# Patient Record
Sex: Male | Born: 1960 | Hispanic: No | Marital: Married | Smoking: Never smoker
Health system: Southern US, Community
[De-identification: ages and names within clinical notes are randomized; demographics above are authoritative.]

## PROBLEM LIST (undated history)

## (undated) DIAGNOSIS — K219 Gastro-esophageal reflux disease without esophagitis: Secondary | ICD-10-CM

## (undated) DIAGNOSIS — E785 Hyperlipidemia, unspecified: Secondary | ICD-10-CM

## (undated) DIAGNOSIS — E119 Type 2 diabetes mellitus without complications: Secondary | ICD-10-CM

## (undated) DIAGNOSIS — I10 Essential (primary) hypertension: Secondary | ICD-10-CM

## (undated) HISTORY — PX: COLONOSCOPY: SHX174

## (undated) HISTORY — DX: Type 2 diabetes mellitus without complications: E11.9

## (undated) HISTORY — DX: Essential (primary) hypertension: I10

## (undated) HISTORY — DX: Hyperlipidemia, unspecified: E78.5

## (undated) HISTORY — DX: Gastro-esophageal reflux disease without esophagitis: K21.9

---

## 2010-11-04 ENCOUNTER — Emergency Department (HOSPITAL_COMMUNITY)
Admission: EM | Admit: 2010-11-04 | Discharge: 2010-11-04 | Disposition: A | Discharge status: Home / Self Care | Payer: BC Managed Care – PPO | Attending: Emergency Medicine | Admitting: Emergency Medicine

## 2010-11-04 DIAGNOSIS — Z79899 Other long term (current) drug therapy: Secondary | ICD-10-CM | POA: Insufficient documentation

## 2010-11-04 DIAGNOSIS — I10 Essential (primary) hypertension: Secondary | ICD-10-CM | POA: Insufficient documentation

## 2010-11-04 DIAGNOSIS — H9190 Unspecified hearing loss, unspecified ear: Secondary | ICD-10-CM | POA: Insufficient documentation

## 2010-11-04 DIAGNOSIS — H9209 Otalgia, unspecified ear: Secondary | ICD-10-CM | POA: Insufficient documentation

## 2010-11-04 DIAGNOSIS — H7290 Unspecified perforation of tympanic membrane, unspecified ear: Secondary | ICD-10-CM | POA: Insufficient documentation

## 2010-11-04 DIAGNOSIS — E119 Type 2 diabetes mellitus without complications: Secondary | ICD-10-CM | POA: Insufficient documentation

## 2010-11-04 DIAGNOSIS — E7800 Pure hypercholesterolemia, unspecified: Secondary | ICD-10-CM | POA: Insufficient documentation

## 2011-09-13 ENCOUNTER — Encounter: Payer: Self-pay | Admitting: Internal Medicine

## 2011-09-15 ENCOUNTER — Ambulatory Visit (INDEPENDENT_AMBULATORY_CARE_PROVIDER_SITE_OTHER): Payer: BC Managed Care – PPO

## 2011-09-15 DIAGNOSIS — Z0000 Encounter for general adult medical examination without abnormal findings: Secondary | ICD-10-CM

## 2011-09-15 DIAGNOSIS — E119 Type 2 diabetes mellitus without complications: Secondary | ICD-10-CM

## 2011-09-15 DIAGNOSIS — E789 Disorder of lipoprotein metabolism, unspecified: Secondary | ICD-10-CM

## 2011-10-16 ENCOUNTER — Other Ambulatory Visit: Payer: Self-pay | Admitting: Physician Assistant

## 2012-03-28 ENCOUNTER — Ambulatory Visit: Payer: BC Managed Care – PPO | Admitting: Internal Medicine

## 2012-03-28 VITALS — BP 112/70 | HR 88 | Temp 98.2°F | Resp 16 | Ht 69.5 in | Wt 182.0 lb

## 2012-03-28 DIAGNOSIS — S335XXA Sprain of ligaments of lumbar spine, initial encounter: Secondary | ICD-10-CM

## 2012-03-28 DIAGNOSIS — E139 Other specified diabetes mellitus without complications: Secondary | ICD-10-CM

## 2012-03-28 DIAGNOSIS — E119 Type 2 diabetes mellitus without complications: Secondary | ICD-10-CM

## 2012-03-28 DIAGNOSIS — S39012A Strain of muscle, fascia and tendon of lower back, initial encounter: Secondary | ICD-10-CM

## 2012-03-28 LAB — GLUCOSE, POCT (MANUAL RESULT ENTRY): POC Glucose: 81 mg/dl (ref 70–99)

## 2012-03-28 LAB — POCT GLYCOSYLATED HEMOGLOBIN (HGB A1C): Hemoglobin A1C: 6.3

## 2012-03-28 MED ORDER — SIMVASTATIN 20 MG PO TABS: 20.0000 mg | ORAL_TABLET | Freq: Every evening | ORAL | Status: DC

## 2012-03-28 MED ORDER — METFORMIN HCL 1000 MG PO TABS: 1000.0000 mg | ORAL_TABLET | Freq: Two times a day (BID) | ORAL | Status: DC

## 2012-03-28 MED ORDER — LOSARTAN POTASSIUM 50 MG PO TABS: 50.0000 mg | ORAL_TABLET | Freq: Every day | ORAL | Status: DC

## 2012-03-28 MED ORDER — GLIMEPIRIDE 2 MG PO TABS: 2.0000 mg | ORAL_TABLET | Freq: Every day | ORAL | Status: DC

## 2012-03-28 NOTE — Progress Notes (Signed)
  Subjective:    Patient ID: Wayne Perez, male    DOB: June 15, 1961, 51 y.o.   MRN: [id number]  HPI Has niddm, has been controlled. Last a1c was 6.1 Strained back at work, has mild left ls pain with use only.   Review of Systems     Objective:   Physical Exam Normal, sensation intact  Cmet,a1c,gl Results for orders placed in visit on 03/28/12  GLUCOSE, POCT (MANUAL RESULT ENTRY)      Component Value Range   POC Glucose 81  70 - 99 mg/dl  POCT GLYCOSYLATED HEMOGLOBIN (HGB A1C)      Component Value Range   Hemoglobin A1C 6.3           Assessment & Plan:  Back manual RF dm meds

## 2012-03-29 LAB — COMPREHENSIVE METABOLIC PANEL
ALT: 27 U/L (ref 0–53)
AST: 24 U/L (ref 0–37)
Alkaline Phosphatase: 49 U/L (ref 39–117)
CO2: 25 mEq/L (ref 19–32)
Creat: 0.86 mg/dL (ref 0.50–1.35)
Sodium: 134 mEq/L — ABNORMAL LOW (ref 135–145)
Total Bilirubin: 0.8 mg/dL (ref 0.3–1.2)
Total Protein: 7.3 g/dL (ref 6.0–8.3)

## 2012-07-31 ENCOUNTER — Ambulatory Visit (INDEPENDENT_AMBULATORY_CARE_PROVIDER_SITE_OTHER): Payer: BC Managed Care – PPO | Admitting: Internal Medicine

## 2012-07-31 ENCOUNTER — Encounter: Payer: Self-pay | Admitting: Internal Medicine

## 2012-07-31 VITALS — BP 118/84 | HR 60 | Temp 97.9°F | Resp 16 | Ht 69.0 in | Wt 188.9 lb

## 2012-07-31 DIAGNOSIS — I10 Essential (primary) hypertension: Secondary | ICD-10-CM | POA: Insufficient documentation

## 2012-07-31 DIAGNOSIS — M545 Low back pain: Secondary | ICD-10-CM

## 2012-07-31 DIAGNOSIS — Z23 Encounter for immunization: Secondary | ICD-10-CM

## 2012-07-31 DIAGNOSIS — E119 Type 2 diabetes mellitus without complications: Secondary | ICD-10-CM

## 2012-07-31 DIAGNOSIS — C91Z0 Other lymphoid leukemia not having achieved remission: Secondary | ICD-10-CM

## 2012-07-31 NOTE — Patient Instructions (Signed)

## 2012-07-31 NOTE — Progress Notes (Signed)
  Subjective:    Patient ID: Wayne Perez, male    DOB: Dec 21, 1960, 51 y.o.   MRN: [id number]  HPI DM controlled  Review of Systems     Objective:   Physical Exam Normal  Results for orders placed in visit on 07/31/12  GLUCOSE, POCT (MANUAL RESULT ENTRY)      Component Value Range   POC Glucose 114 (*) 70 - 99 mg/dl  POCT GLYCOSYLATED HEMOGLOBIN (HGB A1C)      Component Value Range   Hemoglobin A1C 6.8          Assessment & Plan:  RF meds 35yr

## 2013-01-01 ENCOUNTER — Ambulatory Visit: Payer: BC Managed Care – PPO | Admitting: Family Medicine

## 2013-01-01 VITALS — BP 124/82 | HR 74 | Temp 98.2°F | Resp 17 | Ht 69.5 in | Wt 190.0 lb

## 2013-01-01 DIAGNOSIS — K219 Gastro-esophageal reflux disease without esophagitis: Secondary | ICD-10-CM

## 2013-01-01 MED ORDER — ESOMEPRAZOLE MAGNESIUM 40 MG PO CPDR: 40.0000 mg | DELAYED_RELEASE_CAPSULE | Freq: Every day | ORAL | Status: DC

## 2013-01-01 MED ORDER — RANITIDINE HCL 150 MG PO TABS: 150.0000 mg | ORAL_TABLET | Freq: Two times a day (BID) | ORAL | Status: DC | PRN

## 2013-01-01 NOTE — Patient Instructions (Addendum)
Diet for Gastroesophageal Reflux Disease, Adult  Reflux (acid reflux) is when acid from your stomach flows up into the esophagus. When acid comes in contact with the esophagus, the acid causes irritation and soreness (inflammation) in the esophagus. When reflux happens often or so severely that it causes damage to the esophagus, it is called gastroesophageal reflux disease (GERD). Nutrition therapy can help ease the discomfort of GERD.  FOODS OR DRINKS TO AVOID OR LIMIT  · Smoking or chewing tobacco. Nicotine is one of the most potent stimulants to acid production in the gastrointestinal tract.  · Caffeinated and decaffeinated coffee and black tea.  · Regular or low-calorie carbonated beverages or energy drinks (caffeine-free carbonated beverages are allowed).    · Strong spices, such as black pepper, white pepper, red pepper, cayenne, curry powder, and chili powder.  · Peppermint or spearmint.  · Chocolate.  · High-fat foods, including meats and fried foods. Extra added fats including oils, butter, salad dressings, and nuts. Limit these to less than 8 tsp per day.  · Fruits and vegetables if they are not tolerated, such as citrus fruits or tomatoes.  · Alcohol.  · Any food that seems to aggravate your condition.  If you have questions regarding your diet, call your caregiver or a registered dietitian.  OTHER THINGS THAT MAY HELP GERD INCLUDE:   · Eating your meals slowly, in a relaxed setting.  · Eating 5 to 6 small meals per day instead of 3 large meals.  · Eliminating food for a period of time if it causes distress.  · Not lying down until 3 hours after eating a meal.  · Keeping the head of your bed raised 6 to 9 inches (15 to 23 cm) by using a foam wedge or blocks under the legs of the bed. Lying flat may make symptoms worse.  · Being physically active. Weight loss may be helpful in reducing reflux in overweight or obese adults.  · Wear loose fitting clothing  EXAMPLE MEAL PLAN  This meal plan is approximately  2,000 calories based on ChooseMyPlate.gov meal planning guidelines.  Breakfast  · ½ cup cooked oatmeal.  · 1 cup strawberries.  · 1 cup low-fat milk.  · 1 oz almonds.  Snack  · 1 cup cucumber slices.  · 6 oz yogurt (made from low-fat or fat-free milk).  Lunch  · 2 slice whole-wheat bread.  · 2½ oz sliced turkey.  · 2 tsp mayonnaise.  · 1 cup blueberries.  · 1 cup snap peas.  Snack  · 6 whole-wheat crackers.  · 1 oz string cheese.  Dinner  · ½ cup brown rice.  · 1 cup mixed veggies.  · 1 tsp olive oil.  · 3 oz grilled fish.  Document Released: 08/23/2005 Document Revised: 11/15/2011 Document Reviewed: 07/09/2011  ExitCare® Patient Information ©2013 [employer].  Gastroesophageal Reflux Disease, Adult  Gastroesophageal reflux disease (GERD) happens when acid from your stomach flows up into the esophagus. When acid comes in contact with the esophagus, the acid causes soreness (inflammation) in the esophagus. Over time, GERD may create small holes (ulcers) in the lining of the esophagus.  CAUSES   · Increased body weight. This puts pressure on the stomach, making acid rise from the stomach into the esophagus.  · Smoking. This increases acid production in the stomach.  · Drinking alcohol. This causes decreased pressure in the lower esophageal sphincter (valve or ring of muscle between the esophagus and stomach), allowing acid from the stomach   into the esophagus.  · Late evening meals and a full stomach. This increases pressure and acid production in the stomach.  · A malformed lower esophageal sphincter.  Sometimes, no cause is found.  SYMPTOMS   · Burning pain in the lower part of the mid-chest behind the breastbone and in the mid-stomach area. This may occur twice a week or more often.  · Trouble swallowing.  · Sore throat.  · Dry cough.  · Asthma-like symptoms including chest tightness, shortness of breath, or wheezing.  DIAGNOSIS   Your caregiver may be able to diagnose GERD based on your symptoms. In some cases,  X-rays and other tests may be done to check for complications or to check the condition of your stomach and esophagus.  TREATMENT   Your caregiver may recommend over-the-counter or prescription medicines to help decrease acid production. Ask your caregiver before starting or adding any new medicines.   HOME CARE INSTRUCTIONS   · Change the factors that you can control. Ask your caregiver for guidance concerning weight loss, quitting smoking, and alcohol consumption.  · Avoid foods and drinks that make your symptoms worse, such as:  · Caffeine or alcoholic drinks.  · Chocolate.  · Peppermint or mint flavorings.  · Garlic and onions.  · Spicy foods.  · Citrus fruits, such as oranges, lemons, or limes.  · Tomato-based foods such as sauce, chili, salsa, and pizza.  · Fried and fatty foods.  · Avoid lying down for the 3 hours prior to your bedtime or prior to taking a nap.  · Eat small, frequent meals instead of large meals.  · Wear loose-fitting clothing. Do not wear anything tight around your waist that causes pressure on your stomach.  · Raise the head of your bed 6 to 8 inches with wood blocks to help you sleep. Extra pillows will not help.  · Only take over-the-counter or prescription medicines for pain, discomfort, or fever as directed by your caregiver.  · Do not take aspirin, ibuprofen, or other nonsteroidal anti-inflammatory drugs (NSAIDs).  SEEK IMMEDIATE MEDICAL CARE IF:   · You have pain in your arms, neck, jaw, teeth, or back.  · Your pain increases or changes in intensity or duration.  · You develop nausea, vomiting, or sweating (diaphoresis).  · You develop shortness of breath, or you faint.  · Your vomit is green, yellow, black, or looks like coffee grounds or blood.  · Your stool is red, bloody, or black.  These symptoms could be signs of other problems, such as heart disease, gastric bleeding, or esophageal bleeding.  MAKE SURE YOU:   · Understand these instructions.  · Will watch your  condition.  · Will get help right away if you are not doing well or get worse.  Document Released: 06/02/2005 Document Revised: 11/15/2011 Document Reviewed: 03/12/2011  ExitCare® Patient Information ©2013 [employer].

## 2013-01-01 NOTE — Progress Notes (Signed)
Subjective:    Patient ID: Wayne Perez, male    DOB: Apr 20, 1961, 52 y.o.   MRN: [id number] Chief Complaint  Patient presents with  . Heartburn    X 2 months. Pt states that for the last 2 months everything he eats gives him heartburn. He has tried OTC meds but the symptoms return.    HPI  Mr. Schipani is having constant heartburn - c/o 2 mos of epigastric pain burning, usually at night - works at night.  No water brash, no melena, no hematochezia.  Normal bowels and urination, no n/v.   Has been using otc medicine w/o relief - mainly otc prilosec once a day.  Used to be on rx ppi which worked but he doesn't want to have to stay on a med.  Would like a more permanent fix.  Past Medical History  Diagnosis Date  . Diabetes mellitus without complication   . Hypertension   . Hyperlipidemia    Current Outpatient Prescriptions on File Prior to Visit  Medication Sig Dispense Refill  . aspirin 81 MG tablet Take 81 mg by mouth daily.      Marland Kitchen glimepiride (AMARYL) 2 MG tablet Take 1 tablet (2 mg total) by mouth daily before breakfast.  90 tablet  3  . losartan (COZAAR) 50 MG tablet Take 1 tablet (50 mg total) by mouth daily.  90 tablet  3  . metFORMIN (GLUCOPHAGE) 1000 MG tablet Take 1 tablet (1,000 mg total) by mouth 2 (two) times daily with a meal.  180 tablet  3  . simvastatin (ZOCOR) 20 MG tablet Take 1 tablet (20 mg total) by mouth every evening.  90 tablet  3  . sitaGLIPtin (JANUVIA) 100 MG tablet Take 100 mg by mouth daily.       No current facility-administered medications on file prior to visit.   Allergies  Allergen Reactions  . Ace Inhibitors Itching     Review of Systems  Constitutional: Positive for appetite change. Negative for fever, chills, diaphoresis, activity change and unexpected weight change.  Respiratory: Negative for shortness of breath.   Cardiovascular: Negative for chest pain.  Gastrointestinal: Positive for abdominal pain. Negative for nausea, vomiting, diarrhea,  constipation, blood in stool, abdominal distention, anal bleeding and rectal pain.  Genitourinary: Negative for dysuria, frequency and decreased urine volume.  Skin: Negative for pallor.  Hematological: Negative for adenopathy. Does not bruise/bleed easily.      BP 124/82  Pulse 74  Temp(Src) 98.2 F (36.8 C) (Oral)  Resp 17  Ht 5' 9.5" (1.765 m)  Wt 190 lb (86.183 kg)  BMI 27.67 kg/m2  SpO2 100% Objective:   Physical Exam  Constitutional: He appears well-developed and well-nourished. No distress.  HENT:  Head: Normocephalic and atraumatic.  Neck: Normal range of motion. Neck supple. No thyromegaly present.  Cardiovascular: Normal rate, regular rhythm and normal heart sounds.   Pulmonary/Chest: Effort normal and breath sounds normal.  Abdominal: Soft. Normal appearance and bowel sounds are normal. He exhibits no distension and no mass. There is no tenderness. There is no rebound, no guarding and no CVA tenderness. No hernia.  Genitourinary: Rectum normal and prostate normal. Rectal exam shows no tenderness and anal tone normal. Guaiac negative stool.  Lymphadenopathy:    He has no cervical adenopathy.  Skin: He is not diaphoretic.      Assessment & Plan:  GERD (gastroesophageal reflux disease) - Plan: H. pylori antibody, IgG, HELICOBACTER PYLORI  ANTIBODY, IGM, - try ppi daily x 2-3  mos w/ prn h2 blocker if needed for break through. If pt has persistent sxs, consider GI referreral for upper endoscopy.   Meds ordered this encounter  Medications  . esomeprazole (NEXIUM) 40 MG capsule    Sig: Take 1 capsule (40 mg total) by mouth daily.    Dispense:  90 capsule    Refill:  3  . ranitidine (ZANTAC) 150 MG tablet    Sig: Take 1 tablet (150 mg total) by mouth 2 (two) times daily as needed for heartburn.    Dispense:  60 tablet    Refill:  5

## 2013-02-26 ENCOUNTER — Ambulatory Visit (INDEPENDENT_AMBULATORY_CARE_PROVIDER_SITE_OTHER): Payer: BC Managed Care – PPO | Admitting: Internal Medicine

## 2013-02-26 ENCOUNTER — Ambulatory Visit: Payer: BC Managed Care – PPO

## 2013-02-26 ENCOUNTER — Encounter: Payer: Self-pay | Admitting: Internal Medicine

## 2013-02-26 VITALS — BP 120/94 | HR 70 | Temp 97.9°F | Resp 16 | Ht 69.0 in | Wt 191.0 lb

## 2013-02-26 DIAGNOSIS — K219 Gastro-esophageal reflux disease without esophagitis: Secondary | ICD-10-CM

## 2013-02-26 DIAGNOSIS — Z9289 Personal history of other medical treatment: Secondary | ICD-10-CM

## 2013-02-26 DIAGNOSIS — Z79899 Other long term (current) drug therapy: Secondary | ICD-10-CM

## 2013-02-26 DIAGNOSIS — E119 Type 2 diabetes mellitus without complications: Secondary | ICD-10-CM

## 2013-02-26 DIAGNOSIS — I10 Essential (primary) hypertension: Secondary | ICD-10-CM

## 2013-02-26 DIAGNOSIS — Z0000 Encounter for general adult medical examination without abnormal findings: Secondary | ICD-10-CM

## 2013-02-26 LAB — CBC WITH DIFFERENTIAL/PLATELET
Hemoglobin: 12.8 g/dL — ABNORMAL LOW (ref 13.0–17.0)
Lymphocytes Relative: 47 % — ABNORMAL HIGH (ref 12–46)
Lymphs Abs: 2.3 10*3/uL (ref 0.7–4.0)
MCV: 73.9 fL — ABNORMAL LOW (ref 78.0–100.0)
Monocytes Relative: 7 % (ref 3–12)
Neutrophils Relative %: 42 % — ABNORMAL LOW (ref 43–77)
Platelets: 203 10*3/uL (ref 150–400)
RBC: 5.21 MIL/uL (ref 4.22–5.81)
WBC: 4.8 10*3/uL (ref 4.0–10.5)

## 2013-02-26 LAB — COMPREHENSIVE METABOLIC PANEL
Alkaline Phosphatase: 44 U/L (ref 39–117)
BUN: 13 mg/dL (ref 6–23)
CO2: 28 mEq/L (ref 19–32)
Creat: 0.96 mg/dL (ref 0.50–1.35)
Glucose, Bld: 113 mg/dL — ABNORMAL HIGH (ref 70–99)
Total Bilirubin: 0.5 mg/dL (ref 0.3–1.2)

## 2013-02-26 LAB — POCT URINALYSIS DIPSTICK
Bilirubin, UA: NEGATIVE
Glucose, UA: NEGATIVE
Nitrite, UA: NEGATIVE
Spec Grav, UA: 1.015
Urobilinogen, UA: 0.2

## 2013-02-26 LAB — LIPID PANEL
Cholesterol: 133 mg/dL (ref 0–200)
HDL: 45 mg/dL (ref >39)
Total CHOL/HDL Ratio: 3 Ratio

## 2013-02-26 LAB — POCT GLYCOSYLATED HEMOGLOBIN (HGB A1C): Hemoglobin A1C: 6.5

## 2013-02-26 LAB — PSA: PSA: 1.77 ng/mL (ref <=4.00)

## 2013-02-26 LAB — POCT UA - MICROSCOPIC ONLY
Bacteria, U Microscopic: NEGATIVE
Casts, Ur, LPF, POC: NEGATIVE
Mucus, UA: NEGATIVE

## 2013-02-26 LAB — IFOBT (OCCULT BLOOD): IFOBT: NEGATIVE

## 2013-02-26 LAB — TSH: TSH: 1.298 u[IU]/mL (ref 0.350–4.500)

## 2013-02-26 MED ORDER — SIMVASTATIN 20 MG PO TABS: 20.0000 mg | ORAL_TABLET | Freq: Every evening | ORAL | Status: DC

## 2013-02-26 MED ORDER — SITAGLIPTIN PHOSPHATE 100 MG PO TABS: 100.0000 mg | ORAL_TABLET | Freq: Every day | ORAL | Status: DC

## 2013-02-26 MED ORDER — METFORMIN HCL 1000 MG PO TABS: 1000.0000 mg | ORAL_TABLET | Freq: Two times a day (BID) | ORAL | Status: DC

## 2013-02-26 MED ORDER — LOSARTAN POTASSIUM 50 MG PO TABS: 50.0000 mg | ORAL_TABLET | Freq: Every day | ORAL | Status: DC

## 2013-02-26 MED ORDER — GLIMEPIRIDE 2 MG PO TABS: 2.0000 mg | ORAL_TABLET | Freq: Every day | ORAL | Status: DC

## 2013-02-26 MED ORDER — ESOMEPRAZOLE MAGNESIUM 40 MG PO CPDR: 40.0000 mg | DELAYED_RELEASE_CAPSULE | Freq: Every day | ORAL | Status: DC

## 2013-02-26 NOTE — Patient Instructions (Signed)
Immunization Schedule, Adult  Influenza vaccine.  Adults should be given 1 dose every year.  Tetanus, diphtheria, and pertussis (Td, Tdap) vaccine.  Adults who have not previously been given Tdap or who do not know their vaccine status should be given 1 dose of Tdap.  Adults should have a Td booster every 10 years.  Doses should be given if needed to catch up on missed doses in the past.  Pregnant women should be given 1 dose of Tdap vaccine during each pregnancy.  Varicella vaccine.  All adults without evidence of immunity to varicella should receive 2 doses or a second dose if they have received only 1 dose.  Pregnant women who do not have evidence of immunity should be given the first dose after their pregnancy.  Human papillomavirus (HPV) vaccine.  Women aged 62 through 26 years who have not been given the vaccine previously should be given the 3 dose series. The second dose should be given 1 to 2 months after the first dose. The third dose should be given at least 24 weeks after the first dose.  The vaccine is not recommended for use in pregnant women. However, pregnancy testing is not needed before being given a dose. If a woman is found to be pregnant after being given a dose, no treatment is needed. In that case, the remaining doses should be delayed until after the pregnancy.  Men aged 8 through 21 years who have not been given the vaccine previously should be given the 3 dose series. Men aged 106 through 18 years may be given the 3 dose series. The second dose should be given 1 to 2 months after the first dose. The third dose should be given at least 24 weeks after the first dose.  Zoster vaccine.  One dose is recommended for adults aged 40 years and older unless certain conditions are present.  Measles, mumps, and rubella (MMR) vaccine.  Adults born before 21 generally are considered immune to measles and mumps. Healthcare workers born before 1957 who do not have  evidence of immunity should consider vaccination.  Adults born in [birth year] or later should have 1 or more doses of MMR vaccine unless there is a contraindication for the vaccine or they have evidence of immunity to the diseases. A second dose should be given at least 28 days after the first dose. Adults receiving certain types of previous vaccines should consider or be given vaccine doses.  For women of childbearing age, rubella immunity should be determined. If there is no evidence of immunity, women who are not pregnant should be vaccinated. If there is no evidence of immunity, women who are pregnant should delay vaccination until after their pregnancy.  Pneumococcal polysaccharide (PPSV23) vaccine.  All adults aged 40 years and older should be given 1 dose.  Adults younger than age 98 years who have certain medical conditions, who smoke cigarettes, who reside in nursing homes or long-term care facilities, or who have an unknown vaccination history should usually be given 1 or 2 doses of the vaccine.  Pneumococcal 13-valent conjugate (PVC13) vaccine.  Adults aged 53 years or older with certain medical conditions and an unknown or incomplete pneumococcal vaccination history should usually be given 1 dose of the vaccine. This dose may be in addition to a PPSV23 vaccine dose.  Meningococcal vaccine.  First-year college students up to age 55 years who are living in residence halls should be given a dose if they did not receive a dose on  or after their 16th birthday.  A dose should be given to microbiologists working with certain meningitis bacteria, military recruits, and people who travel to or live in countries with a high rate of meningitis.  One or 2 doses should be given to adults who have certain high-risk conditions.  Hepatitis A vaccine.  Adults who wish to be protected from this disease, who have certain high-risk conditions, who work with hepatitis A-infected animals, who work in  hepatitis A research labs, or who travel to or work in countries with a high rate of hepatitis A should be given the 2 dose series of the vaccine.  Adults who were previously unvaccinated and who anticipate close contact with an international adoptee during the first 60 days after arrival in the [place] from a country with a high rate of hepatitis A should be given the vaccine. The first dose of the 2 dose series should be given 2 or more weeks before the arrival of the adoptee.  Hepatitis B vaccine.  Adults who wish to be protected from this disease, who have certain high-risk conditions, who may be exposed to blood or other infectious body fluids, who are household contacts or sex partners of hepatitis B positive people, who are clients or workers in certain care facilities, or who travel to or work in countries with a high rate of hepatitis B should be given the 3 dose series of the vaccine. If you travel outside the [place], additional vaccines may be needed. The Centers for Disease Control and Prevention (CDC) provides information about the vaccines, medicines, and other measures necessary to prevent illness and injury during international travel. Visit the CDC website at http://www.church.org/ or call (800) CDC-INFO [470-229-8119]. You may also consult a travel clinic or your caregiver. Document Released: 11/13/2003 Document Revised: 11/15/2011 Document Reviewed: 10/08/2011 [facility] Patient Information 2014 [place], [place].

## 2013-02-26 NOTE — Progress Notes (Signed)
  Subjective:    Patient ID: Wayne Perez, male    DOB: 12/19/1960, 52 y.o.   MRN: [id number]  HPI Diabetes and htn controlled, tolerates all meds, no low sugar spells. Eye exam done, no neuropathy. Working and going to school, needs immunizations, is going into med tech school   Review of Systems  Constitutional: Negative.   HENT: Negative.   Eyes: Negative.   Respiratory: Negative.   Cardiovascular: Negative.   Endocrine: Negative.   Genitourinary: Negative.   Musculoskeletal: Negative.   Skin: Negative.   Allergic/Immunologic: Negative.   Neurological: Negative.   Hematological: Negative.   Psychiatric/Behavioral: Negative.        Objective:   Physical Exam  Vitals reviewed. Constitutional: He is oriented to person, place, and time. He appears well-developed and well-nourished.  HENT:  Right Ear: External ear normal.  Left Ear: External ear normal.  Nose: Nose normal.  Mouth/Throat: Oropharynx is clear and moist.  Eyes: Conjunctivae and EOM are normal. Pupils are equal, round, and reactive to light. No scleral icterus.  Neck: Normal range of motion. Neck supple. No tracheal deviation present. No thyromegaly present.  Cardiovascular: Normal rate, regular rhythm, normal heart sounds and intact distal pulses.   Pulmonary/Chest: Effort normal.  Abdominal: Soft. Bowel sounds are normal. There is no tenderness.  Genitourinary: Rectum normal, prostate normal and penis normal.  Musculoskeletal: Normal range of motion.  Lymphadenopathy:    He has no cervical adenopathy.  Neurological: He is alert and oriented to person, place, and time. He has normal reflexes. No cranial nerve deficit or sensory deficit. He exhibits normal muscle tone. Coordination normal.  Skin: Skin is warm and dry.  Psychiatric: He has a normal mood and affect. His behavior is normal. Judgment and thought content normal.   EKG normal [facility] reading (PRIMARY) by  Dr Perrin Maltese Results for orders placed in visit  on 02/26/13  GLUCOSE, POCT (MANUAL RESULT ENTRY)      Result Value Range   POC Glucose 129 (*) 70 - 99 mg/dl  IFOBT (OCCULT BLOOD)      Result Value Range   IFOBT Negative    POCT GLYCOSYLATED HEMOGLOBIN (HGB A1C)      Result Value Range   Hemoglobin A1C 6.5    POCT UA - MICROSCOPIC ONLY      Result Value Range   WBC, Ur, HPF, POC 0-1     RBC, urine, microscopic 0-2     Bacteria, U Microscopic neg     Mucus, UA neg     Epithelial cells, urine per micros 0-1     Crystals, Ur, HPF, POC neg     Casts, Ur, LPF, POC neg     Yeast, UA neg    POCT URINALYSIS DIPSTICK      Result Value Range   Color, UA yellow     Clarity, UA clear     Glucose, UA neg     Bilirubin, UA neg     Ketones, UA neg     Spec Grav, UA 1.015     Blood, UA small     pH, UA 5.5     Protein, UA neg     Urobilinogen, UA 0.2     Nitrite, UA neg     Leukocytes, UA Negative              Assessment & Plan:  RF all meds 1 year DM/HTN/GERD all controlled

## 2013-02-26 NOTE — Progress Notes (Signed)
  Subjective:    Patient ID: Wayne Perez, male    DOB: [birth year], 52 y.o.   MRN: [id number]  HPI    Review of Systems  Constitutional: Negative.   HENT: Negative.   Eyes: Negative.   Respiratory: Negative.   Cardiovascular: Negative.   Gastrointestinal: Negative.   Endocrine: Negative.   Genitourinary: Negative.   Musculoskeletal: Negative.   Skin: Negative.   Allergic/Immunologic: Negative.   Neurological: Negative.   Hematological: Negative.   Psychiatric/Behavioral: Negative.        Objective:   Physical Exam        Assessment & Plan:

## 2013-02-27 LAB — MEASLES/MUMPS/RUBELLA IMMUNITY
Mumps IgG: 209 AU/mL — ABNORMAL HIGH (ref <9.00)
Rubeola IgG: >300 AU/mL — ABNORMAL HIGH (ref <25.00)

## 2013-03-01 NOTE — Addendum Note (Signed)
Addended by: Johnnette Litter on: 03/01/2013 03:15 PM   Modules accepted: Orders

## 2013-03-05 ENCOUNTER — Ambulatory Visit (INDEPENDENT_AMBULATORY_CARE_PROVIDER_SITE_OTHER): Payer: BC Managed Care – PPO

## 2013-03-05 DIAGNOSIS — Z23 Encounter for immunization: Secondary | ICD-10-CM

## 2013-03-05 NOTE — Progress Notes (Signed)
  Subjective:    Patient ID: Wayne Perez, male    DOB: May 23, 1961, 52 y.o.   MRN: [id number]  HPI  Patient here for HEP B injection only.  Review of Systems     Objective:   Physical Exam        Assessment & Plan:

## 2013-04-04 ENCOUNTER — Ambulatory Visit (INDEPENDENT_AMBULATORY_CARE_PROVIDER_SITE_OTHER): Payer: BC Managed Care – PPO | Admitting: *Deleted

## 2013-04-04 DIAGNOSIS — Z23 Encounter for immunization: Secondary | ICD-10-CM

## 2013-04-04 NOTE — Addendum Note (Signed)
Addended by: Braxton Feathers on: 04/04/2013 12:31 PM   Modules accepted: Orders

## 2013-08-20 ENCOUNTER — Ambulatory Visit: Payer: BC Managed Care – PPO | Admitting: Internal Medicine

## 2013-08-27 ENCOUNTER — Ambulatory Visit: Payer: BC Managed Care – PPO | Admitting: Internal Medicine

## 2013-09-03 ENCOUNTER — Encounter: Payer: Self-pay | Admitting: Internal Medicine

## 2013-09-03 ENCOUNTER — Ambulatory Visit (INDEPENDENT_AMBULATORY_CARE_PROVIDER_SITE_OTHER): Payer: BC Managed Care – PPO | Admitting: Internal Medicine

## 2013-09-03 VITALS — BP 132/81 | HR 71 | Temp 97.6°F | Resp 17 | Ht 69.5 in | Wt 195.0 lb

## 2013-09-03 DIAGNOSIS — Z79899 Other long term (current) drug therapy: Secondary | ICD-10-CM

## 2013-09-03 DIAGNOSIS — K219 Gastro-esophageal reflux disease without esophagitis: Secondary | ICD-10-CM

## 2013-09-03 DIAGNOSIS — I10 Essential (primary) hypertension: Secondary | ICD-10-CM

## 2013-09-03 DIAGNOSIS — E119 Type 2 diabetes mellitus without complications: Secondary | ICD-10-CM

## 2013-09-03 DIAGNOSIS — Z23 Encounter for immunization: Secondary | ICD-10-CM

## 2013-09-03 LAB — BASIC METABOLIC PANEL
CO2: 26 mEq/L (ref 19–32)
Calcium: 9.4 mg/dL (ref 8.4–10.5)
Chloride: 104 mEq/L (ref 96–112)
Glucose, Bld: 128 mg/dL — ABNORMAL HIGH (ref 70–99)
Potassium: 4.2 mEq/L (ref 3.5–5.3)
Sodium: 139 mEq/L (ref 135–145)

## 2013-09-03 LAB — POCT GLYCOSYLATED HEMOGLOBIN (HGB A1C): Hemoglobin A1C: 6.9

## 2013-09-03 LAB — GLUCOSE, POCT (MANUAL RESULT ENTRY): POC Glucose: 144 mg/dl — AB (ref 70–99)

## 2013-09-03 NOTE — Patient Instructions (Signed)
Diabetic Retinopathy Having diabetes for a long time, especially if it is not controlled, can damage the light-sensitive membrane at the back of the eye (retina). The disease of the retina caused by diabetes is called diabetic retinopathy. Taking good care of your diabetes helps reduce the risk of developing diabetic retinopathy. Have regular eye exams. Early detection is the key to keeping your eyes healthy. Diabetes can also affect other parts of the eye with vision-threatening results, such as cataracts and a form of glaucoma that is very difficult to treat. SYMPTOMS  In the early stages of diabetic retinopathy, there are often no symptoms. As the condition advances, symptoms may include:  Blurred vision. This may go away when blood glucose (sugar) levels are normal. This type of reversible change in vision is usually due to swelling of the lens.  Moving speck or dark spots (floaters) in your vision. This can be caused by small amounts of blood (hemorrhages) escaping from the blood vessels of the retina.  Missing parts of your field of vision. This can be caused by larger hemorrhages within the tissue of the retina.  Poor night vision.  Poor color vision.  Sudden drop or loss of vision in one eye. This may be caused by a hemorrhage from retinal blood vessels into the cavity of the inside of the eye. You should not wait until you have symptoms. An eye care specialist can start treatment before visual impairment occurs. DIAGNOSIS  Your eye care specialist can detect the diabetic changes in your blood vessels by putting drops in your eyes to enlarge the size of (dilate) your pupils. This allows a bigger "window" through which the caregiver can see the entire inside of your eyes.  TREATMENT   If you have the type of diabetes that requires you to use insulin, your risk of diabetic retinopathy is very high. You should have your eyes checked at least every 6 months.  If you have diabetes that was  diagnosed during childhood or before the age of 44, your risk of diabetic retinopathy is very high. You should have your eyes checked at least every 6 months.  If you have diabetes that is controlled by diet, you should have the dilated eye exam when first diagnosed and yearly thereafter.  If your diabetes is not under good control as measured by your blood glucose levels and other indicators, it is critical that you have your eyes checked even more often. Your caregiver can usually see the problems of diabetic retinopathy developing long before it causes a problem. In many cases, it can be treated to prevent complications. HOME CARE INSTRUCTIONS   Keep blood pressure in goal range.  Keep blood glucose in target range.  Follow your caregiver's orders regarding diet and other means for controlling your blood glucose levels.  Check both your urine and blood levels for glucose as recommended by your caregiver. SEEK MEDICAL CARE IF:   You notice gradual blurring or other changes in your vision over time.  You notice that your glasses or contact lenses do not make things look as sharp as they once did.  You have trouble reading or seeing details at a distance with either eye. SEEK IMMEDIATE MEDICAL CARE IF:   You notice a sudden change in your vision or parts of your field of vision appear missing or hazy. This may mean you have lost some vision. Get help right away to prevent further vision loss.  You suddenly see moving specks or dark spots  in the field of vision of either eye.  You have a sudden partial or total loss of vision in either eye. Document Released: 08/20/2000 Document Revised: 11/15/2011 Document Reviewed: 02/12/2013 [facility] Patient Information 2014 [place], [place].

## 2013-09-03 NOTE — Progress Notes (Signed)
   Subjective:    Patient ID: Wayne Perez, male    DOB: 1961/05/07, 52 y.o.   MRN: [id number]  HPI Diabetes controlled, tolerates meds, LDL less than 80. No neuropathy or nephropathy, or retinopathy. HTN controlled  Review of Systems Working doing well     Objective:   Physical Exam  Constitutional: He is oriented to person, place, and time. He appears well-developed and well-nourished. No distress.  HENT:  Head: Normocephalic.  Eyes: EOM are normal. Pupils are equal, round, and reactive to light.  Neck: Normal range of motion.  Cardiovascular: Normal rate.   Pulmonary/Chest: Effort normal.  Musculoskeletal: Normal range of motion.  Neurological: He is alert and oriented to person, place, and time. No cranial nerve deficit or sensory deficit. He exhibits normal muscle tone. Coordination normal.  Skin: No rash noted.  Psychiatric: He has a normal mood and affect. His behavior is normal.    Results for orders placed in visit on 09/03/13  GLUCOSE, POCT (MANUAL RESULT ENTRY)      Result Value Range   POC Glucose 144 (*) 70 - 99 mg/dl  POCT GLYCOSYLATED HEMOGLOBIN (HGB A1C)      Result Value Range   Hemoglobin A1C 6.9           Assessment & Plan:  NIDDM /HTN controlled See opthalmology yearly RF meds 1 year RTC 4 months Needs 3rd Hepatitis B vac today

## 2013-09-06 ENCOUNTER — Encounter: Payer: Self-pay | Admitting: *Deleted

## 2014-03-04 ENCOUNTER — Encounter: Payer: Self-pay | Admitting: Family Medicine

## 2014-03-04 ENCOUNTER — Ambulatory Visit (INDEPENDENT_AMBULATORY_CARE_PROVIDER_SITE_OTHER): Payer: Managed Care, Other (non HMO) | Admitting: Family Medicine

## 2014-03-04 VITALS — BP 126/92 | HR 77 | Temp 98.5°F | Resp 16 | Ht 69.25 in | Wt 203.6 lb

## 2014-03-04 DIAGNOSIS — I10 Essential (primary) hypertension: Secondary | ICD-10-CM

## 2014-03-04 DIAGNOSIS — Z0000 Encounter for general adult medical examination without abnormal findings: Secondary | ICD-10-CM

## 2014-03-04 DIAGNOSIS — Z1211 Encounter for screening for malignant neoplasm of colon: Secondary | ICD-10-CM

## 2014-03-04 DIAGNOSIS — K219 Gastro-esophageal reflux disease without esophagitis: Secondary | ICD-10-CM

## 2014-03-04 DIAGNOSIS — E119 Type 2 diabetes mellitus without complications: Secondary | ICD-10-CM

## 2014-03-04 DIAGNOSIS — E785 Hyperlipidemia, unspecified: Secondary | ICD-10-CM

## 2014-03-04 DIAGNOSIS — Z125 Encounter for screening for malignant neoplasm of prostate: Secondary | ICD-10-CM

## 2014-03-04 LAB — POCT URINALYSIS DIPSTICK
Bilirubin, UA: NEGATIVE
Blood, UA: NEGATIVE
Glucose, UA: NEGATIVE
Ketones, UA: NEGATIVE
Leukocytes, UA: NEGATIVE
NITRITE UA: NEGATIVE
PH UA: 6
Protein, UA: 30
Spec Grav, UA: 1.02
UROBILINOGEN UA: 0.2

## 2014-03-04 LAB — GLUCOSE, POCT (MANUAL RESULT ENTRY): POC GLUCOSE: 109 mg/dL — AB (ref 70–99)

## 2014-03-04 LAB — POCT GLYCOSYLATED HEMOGLOBIN (HGB A1C): HEMOGLOBIN A1C: 6.7

## 2014-03-04 MED ORDER — SIMVASTATIN 20 MG PO TABS: 20.0000 mg | ORAL_TABLET | Freq: Every evening | ORAL | Status: DC

## 2014-03-04 MED ORDER — METFORMIN HCL 1000 MG PO TABS: 1000.0000 mg | ORAL_TABLET | Freq: Two times a day (BID) | ORAL | Status: DC

## 2014-03-04 MED ORDER — GLIMEPIRIDE 2 MG PO TABS
2.0000 mg | ORAL_TABLET | Freq: Every day | ORAL | Status: DC
Start: 2014-03-04 — End: 2014-09-09

## 2014-03-04 MED ORDER — SITAGLIPTIN PHOSPHATE 100 MG PO TABS: 100.0000 mg | ORAL_TABLET | Freq: Every day | ORAL | Status: DC

## 2014-03-04 MED ORDER — ESOMEPRAZOLE MAGNESIUM 40 MG PO CPDR: 40.0000 mg | DELAYED_RELEASE_CAPSULE | Freq: Every day | ORAL | Status: DC

## 2014-03-04 MED ORDER — LOSARTAN POTASSIUM 50 MG PO TABS: 50.0000 mg | ORAL_TABLET | Freq: Every day | ORAL | Status: DC

## 2014-03-04 NOTE — Progress Notes (Addendum)
Subjective:   This chart was scribed for [name], MD by Forrestine Him, [facility] Scribe. This patient was seen in room 25 and the patient's care was started 2:01 PM.    Patient ID: Wayne Perez, male    DOB: Feb 13, 1961, 53 y.o.   MRN: [id number]  Chief Complaint  Patient presents with  . Annual Exam   HPI  HPI Comments: Wayne Perez is a 53 y.o. male with a PMHx of HTN, GERD, HTN, and Type 2 Diabetes who presents to [facility] for a complete physical examination today. Prior pt of Dr. Elder Cyphers.  1. Annual Exam: Colonscopy: Last colonoscopy 8 years ago-Normal. Pt was not made aware of next follow up. Tetanus: September 2010 had a Tdap Prostate Cancer Screening: Last PSA 02/2013-Normal at 1.77 Flu Vaccine: 06/2013 Denitist: 10/2013 Eye Care: 07/2013- No issues with diabetes Exercise: Pt has a very active job. In addition, he works out about 1-2 times weekly for 30 minutes to 1 hour EKG: 02/2013 Sinus bradycardia with rate of 50, otherwise normal  2. Diabetes:  Last seen 08/2013-A1C was 6.9. Pt is currently takes Metformin 100 mg BID Pt is currently taking Januvia 100 mg QD and Amaryl 2 mg once per day Pt occasionally checks his blood sugar and reports readings between 120-180. No recent symptomatic lows at this time. However, he reports feeling dizzy when his blood sugar is low.  3. HTN: Pt is taking Cozaar 50 mg once daily Kidney function normal in 08/2013 Pt sometimes occasionally checks his blood pressure at home with readings around 120/80  4. GERD: He is currently taking Nexium 40 mg once daily- He has been taking this medication for almost 1 year now No history of endoscopy; requesting an order to follow up No unexplained weight loss or bloody/tarry stools Pt is eating and drinking as normal He admits to drinking about 3-4 cups of coffee daily  5. Hyperlipidemia: Pt is taking Zocor 20 mg once daily This was controlled when  checked in 02/2013  Lab Results  Component Value Date   CHOL 133 02/26/2013   HDL 45 02/26/2013   LDLCALC 70 02/26/2013   TRIG 88 02/26/2013   CHOLHDL 3.0 02/26/2013     Patient Active Problem List   Diagnosis Date Noted  . Diabetes 07/31/2012  . HTN (hypertension) 07/31/2012   Past Medical History  Diagnosis Date  . Diabetes mellitus without complication   . Hypertension   . Hyperlipidemia   . GERD (gastroesophageal reflux disease)    History reviewed. No pertinent past surgical history. Allergies  Allergen Reactions  . Ace Inhibitors Itching   Prior to Admission medications   Medication Sig Start Date End Date Taking? Authorizing Cynde Menard  aspirin 81 MG tablet Take 81 mg by mouth daily.   Yes Historical Penny Arrambide, MD  esomeprazole (NEXIUM) 40 MG capsule Take 1 capsule (40 mg total) by mouth daily. NEED REFILLS 02/26/13  Yes Orma Flaming, MD  glimepiride (AMARYL) 2 MG tablet Take 1 tablet (2 mg total) by mouth daily before breakfast. NEED REFILLS 02/26/13  Yes Orma Flaming, MD  losartan (COZAAR) 50 MG tablet Take 1 tablet (50 mg total) by mouth daily. NEED REFILLS 02/26/13  Yes Orma Flaming, MD  metFORMIN (GLUCOPHAGE) 1000 MG tablet Take 1 tablet (1,000 mg total) by mouth 2 (two) times daily with a meal. NEED REFILLS 02/26/13  Yes Orma Flaming, MD  simvastatin (ZOCOR) 20 MG tablet Take  1 tablet (20 mg total) by mouth every evening. NEED REFILLS 02/26/13  Yes Orma Flaming, MD  sitaGLIPtin (JANUVIA) 100 MG tablet Take 1 tablet (100 mg total) by mouth daily. 02/26/13  Yes Orma Flaming, MD  ranitidine (ZANTAC) 150 MG tablet Take 1 tablet (150 mg total) by mouth 2 (two) times daily as needed for heartburn. 01/01/13   Shawnee Knapp, MD   History   Social History  . Marital Status: Married    Spouse Name: N/A    Number of Children: N/A  . Years of Education: N/A   Occupational History  . Cook    Social History Main Topics  . Smoking status: Never Smoker   . Smokeless tobacco: Never  Used  . Alcohol Use: No  . Drug Use: Yes  . Sexual Activity: Not on file   Other Topics Concern  . Not on file   Social History Narrative   Married. Education: [employer]. Exercise: Walks twice a week for 1-2 hours.     Review of Systems  All other systems reviewed and are negative.  13 point ROS reviewed on pt health survey. No positive responses    Objective:  Physical Exam  Vitals reviewed. Constitutional: He is oriented to person, place, and time. He appears well-developed and well-nourished.  HENT:  Head: Normocephalic and atraumatic.  Right Ear: External ear normal.  Left Ear: External ear normal.  Mouth/Throat: Oropharynx is clear and moist.  Eyes: Conjunctivae and EOM are normal. Pupils are equal, round, and reactive to light.  Neck: Normal range of motion. Neck supple. No JVD present. Carotid bruit is not present. No thyromegaly present.  Cardiovascular: Normal rate, regular rhythm, normal heart sounds and intact distal pulses.   No murmur heard. Pulmonary/Chest: Effort normal and breath sounds normal. No respiratory distress. He has no wheezes. He has no rales.  Abdominal: Soft. He exhibits no distension. There is no tenderness. Hernia confirmed negative in the right inguinal area and confirmed negative in the left inguinal area.  Genitourinary: Prostate normal.  varicocoele on L, unchanged per pt for greater than 5 years No tenderness  Musculoskeletal: Normal range of motion. He exhibits no edema and no tenderness.  Lymphadenopathy:    He has no cervical adenopathy.  Neurological: He is alert and oriented to person, place, and time. He has normal reflexes.  Microfilament testing normal bilateral feet  Skin: Skin is warm and dry.  Psychiatric: He has a normal mood and affect. His behavior is normal.    Filed Vitals:   03/04/14 1322  BP: 126/92  Pulse: 77  Temp: 98.5 F (36.9 C)  TempSrc: Oral  Resp: 16  Height: 5' 9.25" (1.759 m)  Weight: 203 lb 9.6 oz  (92.352 kg)  SpO2: 99%     Visual Acuity Screening   Right eye Left eye Both eyes  Without correction:     With correction: 20/20 20/13 20/13      Results for orders placed in visit on 03/04/14  GLUCOSE, POCT (MANUAL RESULT ENTRY)      Result Value Ref Range   POC Glucose 109 (*) 70 - 99 mg/dl  POCT GLYCOSYLATED HEMOGLOBIN (HGB A1C)      Result Value Ref Range   Hemoglobin A1C 6.7    POCT URINALYSIS DIPSTICK      Result Value Ref Range   Color, UA yellow     Clarity, UA clear     Glucose, UA neg     Bilirubin, UA neg  Ketones, UA neg     Spec Grav, UA 1.020     Blood, UA neg     pH, UA 6.0     Protein, UA 30     Urobilinogen, UA 0.2     Nitrite, UA neg     Leukocytes, UA Negative         Assessment & Plan:   Wayne Perez is a 53 y.o. male Annual physical exam - Plan: Ambulatory referral to Gastroenterology, POCT urinalysis dipstick, COMPLETE METABOLIC PANEL WITH GFR, Lipid panel, TSH, PSA, CBC  --anticipatory guidance as below in AVS, screening labs above. Health maintenance items as above in HPI discussed/recommended as applicable.   - referring to GI for Jerrye Bushy sx's but to determine need for colonoscopy at that time.   Type II or unspecified type diabetes mellitus without mention of complication, not stated as uncontrolled - Plan: HM Diabetes Foot Exam, POCT glucose (manual entry), POCT glycosylated hemoglobin (Hb A1C), Microalbumin, urine, glimepiride (AMARYL) 2 MG tablet, losartan (COZAAR) 50 MG tablet, metFORMIN (GLUCOPHAGE) 1000 MG tablet, sitaGLIPtin (JANUVIA) 100 MG tablet  -controlled. No changes for now, but if does experience hypoglycemic sx's, rtc or call to possibly stop amaryl.  Hypoglycemic precautions discussed including carrying glucose on person, and meter to check if any s/sx's of hypoglycemia.   - check microalbumin. rtc for DM check in 6 months as controlled.   Essential hypertension - Plan: COMPLETE METABOLIC PANEL WITH GFR, losartan (COZAAR) 50 MG  tablet  -stable. No med changes.   Other and unspecified hyperlipidemia - Plan: simvastatin (ZOCOR) 20 MG tablet  - stable. Labs pending. Cont zocor for now.  Gastroesophageal reflux disease, esophagitis presence not specified - Plan: Ambulatory referral to Gastroenterology, esomeprazole (NEXIUM) 40 MG capsule, glimepiride (AMARYL) 2 MG tablet, losartan (COZAAR) 50 MG tablet, metFORMIN (GLUCOPHAGE) 1000 MG tablet  - trial of avoidance of trigger foods - including caffeine. Will refer to GI for eval as 1 year of sx's, and to determine if endoscopy needed as he requests.   Screening for colon cancer - refer to GI for possible colonoscopy - depending on last procedure and recommended follow up at that time.   Screening for prostate cancer - testing discussed, he chose to have screening performed. Check PSA.   Meds ordered this encounter  Medications  . esomeprazole (NEXIUM) 40 MG capsule    Sig: Take 1 capsule (40 mg total) by mouth daily.    Dispense:  90 capsule    Refill:  1  . glimepiride (AMARYL) 2 MG tablet    Sig: Take 1 tablet (2 mg total) by mouth daily before breakfast.    Dispense:  90 tablet    Refill:  1  . losartan (COZAAR) 50 MG tablet    Sig: Take 1 tablet (50 mg total) by mouth daily.    Dispense:  90 tablet    Refill:  1  . metFORMIN (GLUCOPHAGE) 1000 MG tablet    Sig: Take 1 tablet (1,000 mg total) by mouth 2 (two) times daily with a meal.    Dispense:  180 tablet    Refill:  1  . simvastatin (ZOCOR) 20 MG tablet    Sig: Take 1 tablet (20 mg total) by mouth every evening.    Dispense:  90 tablet    Refill:  1  . sitaGLIPtin (JANUVIA) 100 MG tablet    Sig: Take 1 tablet (100 mg total) by mouth daily.    Dispense:  90 tablet  Refill:  1   Patient Instructions  You should receive a call or letter about your lab results within the next week to 10 days.  Ok to continue nexium, but avoid trigger foods as below.  We will refer you to gastroenterologist. Diabetes  controlled, no changes for now, but if you have any low blood sugars - we may need to decrease medications.  Keep candy and your meter with you at all times in case this happens, and if you do have low blood sugar readings - let me know.  Let me know if you have any questions.     Food Choices for Gastroesophageal Reflux Disease When you have gastroesophageal reflux disease (GERD), the foods you eat and your eating habits are very important. Choosing the right foods can help ease the discomfort of GERD. WHAT GENERAL GUIDELINES DO I NEED TO FOLLOW?  Choose fruits, vegetables, whole grains, low-fat dairy products, and low-fat meat, fish, and poultry.  Limit fats such as oils, salad dressings, butter, nuts, and avocado.  Keep a food diary to identify foods that cause symptoms.  Avoid foods that cause reflux. These may be different for different people.  Eat frequent small meals instead of three large meals each day.  Eat your meals slowly, in a relaxed setting.  Limit fried foods.  Cook foods using methods other than frying.  Avoid drinking alcohol.  Avoid drinking large amounts of liquids with your meals.  Avoid bending over or lying down until 2-3 hours after eating. WHAT FOODS ARE NOT RECOMMENDED? The following are some foods and drinks that may worsen your symptoms: Vegetables Tomatoes. Tomato juice. Tomato and spaghetti sauce. Chili peppers. Onion and garlic. Horseradish. Fruits Oranges, grapefruit, and lemon (fruit and juice). Meats High-fat meats, fish, and poultry. This includes hot dogs, ribs, ham, sausage, salami, and bacon. Dairy Whole milk and chocolate milk. Sour cream. Cream. Butter. Ice cream. Cream cheese.  Beverages Coffee and tea, with or without caffeine. Carbonated beverages or energy drinks. Condiments Hot sauce. Barbecue sauce.  Sweets/Desserts Chocolate and cocoa. Donuts. Peppermint and spearmint. Fats and Oils High-fat foods, including [place] fries  and potato chips. Other Vinegar. Strong spices, such as black pepper, white pepper, red pepper, cayenne, curry powder, cloves, ginger, and chili powder. The items listed above may not be a complete list of foods and beverages to avoid. Contact your dietitian for more information. Document Released: 08/23/2005 Document Revised: 08/28/2013 Document Reviewed: 06/27/2013 [facility] Patient Information 2015 [place], [place]. This information is not intended to replace advice given to you by your health care Dakisha Schoof. Make sure you discuss any questions you have with your health care Tamari Busic.  Keeping you healthy  Get these tests  Blood pressure- Have your blood pressure checked once a year by your healthcare Patsye Sullivant.  Normal blood pressure is 120/80  Weight- Have your body mass index (BMI) calculated to screen for obesity.  BMI is a measure of body fat based on height and weight. You can also calculate your own BMI at ViewBanking.si.  Cholesterol- Have your cholesterol checked every year.  Diabetes- Have your blood sugar checked regularly if you have high blood pressure, high cholesterol, have a family history of diabetes or if you are overweight.  Screening for Colon Cancer- Colonoscopy starting at age 15.  Screening may begin sooner depending on your family history and other health conditions. Follow up colonoscopy as directed by your Gastroenterologist.  Screening for Prostate Cancer- Both blood work (PSA) and a rectal exam help  screen for Prostate Cancer.  Screening begins at age 71 with African-American men and at age 60 with Caucasian men.  Screening may begin sooner depending on your family history.  Take these medicines  Aspirin- One aspirin daily can help prevent Heart disease and Stroke.  Flu shot- Every fall.  Tetanus- Every 10 years.  Zostavax- Once after the age of 18 to prevent Shingles.  Pneumonia shot- Once after the age of 63; if you are younger than 64, ask your  healthcare Yeila Morro if you need a Pneumonia shot.  Take these steps  Don't smoke- If you do smoke, talk to your doctor about quitting.  For tips on how to quit, go to www.smokefree.gov or call 1-800-QUIT-NOW.  Be physically active- Exercise 5 days a week for at least 30 minutes.  If you are not already physically active start slow and gradually work up to 30 minutes of moderate physical activity.  Examples of moderate activity include walking briskly, mowing the yard, dancing, swimming, bicycling, etc.  Eat a healthy diet- Eat a variety of healthy food such as fruits, vegetables, low fat milk, low fat cheese, yogurt, lean meant, poultry, fish, beans, tofu, etc. For more information go to www.thenutritionsource.org  Drink alcohol in moderation- Limit alcohol intake to less than two drinks a day. Never drink and drive.  Dentist- Brush and floss twice daily; visit your dentist twice a year.  Depression- Your emotional health is as important as your physical health. If you're feeling down, or losing interest in things you would normally enjoy please talk to your healthcare Tonya Carlile.  Eye exam- Visit your eye doctor every year.  Safe sex- If you may be exposed to a sexually transmitted infection, use a condom.  Seat belts- Seat belts can save your life; always wear one.  Smoke/Carbon Monoxide detectors- These detectors need to be installed on the appropriate level of your home.  Replace batteries at least once a year.  Skin cancer- When out in the sun, cover up and use sunscreen 15 SPF or higher.  Violence- If anyone is threatening you, please tell your healthcare Burnett Lieber.  Living Will/ Health care power of attorney- Speak with your healthcare Keigan Girten and family.     I personally performed the services described in this documentation, which was scribed in my presence. The recorded information has been reviewed and is accurate.

## 2014-03-04 NOTE — Progress Notes (Signed)
   Subjective:    Patient ID: Wayne Perez, male    DOB: [birth year], 53 y.o.   MRN: [id number]  HPI    Review of Systems  Constitutional: Negative.   HENT: Negative.   Eyes: Negative.   Respiratory: Negative.   Cardiovascular: Negative.   Gastrointestinal: Negative.   Endocrine: Negative.   Genitourinary: Negative.   Musculoskeletal: Negative.   Skin: Negative.   Allergic/Immunologic: Negative.   Neurological: Negative.   Hematological: Negative.   Psychiatric/Behavioral: Negative.        Objective:   Physical Exam        Assessment & Plan:

## 2014-03-04 NOTE — Patient Instructions (Addendum)
You should receive a call or letter about your lab results within the next week to 10 days.  Ok to continue nexium, but avoid trigger foods as below.  We will refer you to gastroenterologist. Diabetes controlled, no changes for now, but if you have any low blood sugars - we may need to decrease medications.  Keep candy and your meter with you at all times in case this happens, and if you do have low blood sugar readings - let me know.  Let me know if you have any questions.     Food Choices for Gastroesophageal Reflux Disease When you have gastroesophageal reflux disease (GERD), the foods you eat and your eating habits are very important. Choosing the right foods can help ease the discomfort of GERD. WHAT GENERAL GUIDELINES DO I NEED TO FOLLOW?  Choose fruits, vegetables, whole grains, low-fat dairy products, and low-fat meat, fish, and poultry.  Limit fats such as oils, salad dressings, butter, nuts, and avocado.  Keep a food diary to identify foods that cause symptoms.  Avoid foods that cause reflux. These may be different for different people.  Eat frequent small meals instead of three large meals each day.  Eat your meals slowly, in a relaxed setting.  Limit fried foods.  Cook foods using methods other than frying.  Avoid drinking alcohol.  Avoid drinking large amounts of liquids with your meals.  Avoid bending over or lying down until 2-3 hours after eating. WHAT FOODS ARE NOT RECOMMENDED? The following are some foods and drinks that may worsen your symptoms: Vegetables Tomatoes. Tomato juice. Tomato and spaghetti sauce. Chili peppers. Onion and garlic. Horseradish. Fruits Oranges, grapefruit, and lemon (fruit and juice). Meats High-fat meats, fish, and poultry. This includes hot dogs, ribs, ham, sausage, salami, and bacon. Dairy Whole milk and chocolate milk. Sour cream. Cream. Butter. Ice cream. Cream cheese.  Beverages Coffee and tea, with or without caffeine.  Carbonated beverages or energy drinks. Condiments Hot sauce. Barbecue sauce.  Sweets/Desserts Chocolate and cocoa. Donuts. Peppermint and spearmint. Fats and Oils High-fat foods, including [place] fries and potato chips. Other Vinegar. Strong spices, such as black pepper, white pepper, red pepper, cayenne, curry powder, cloves, ginger, and chili powder. The items listed above may not be a complete list of foods and beverages to avoid. Contact your dietitian for more information. Document Released: 08/23/2005 Document Revised: 08/28/2013 Document Reviewed: 06/27/2013 [facility] Patient Information 2015 [place], [place]. This information is not intended to replace advice given to you by your health care provider. Make sure you discuss any questions you have with your health care provider.  Keeping you healthy  Get these tests  Blood pressure- Have your blood pressure checked once a year by your healthcare provider.  Normal blood pressure is 120/80  Weight- Have your body mass index (BMI) calculated to screen for obesity.  BMI is a measure of body fat based on height and weight. You can also calculate your own BMI at ViewBanking.si.  Cholesterol- Have your cholesterol checked every year.  Diabetes- Have your blood sugar checked regularly if you have high blood pressure, high cholesterol, have a family history of diabetes or if you are overweight.  Screening for Colon Cancer- Colonoscopy starting at age 5.  Screening may begin sooner depending on your family history and other health conditions. Follow up colonoscopy as directed by your Gastroenterologist.  Screening for Prostate Cancer- Both blood work (PSA) and a rectal exam help screen for Prostate Cancer.  Screening begins at age  35 with African-American men and at age 99 with Caucasian men.  Screening may begin sooner depending on your family history.  Take these medicines  Aspirin- One aspirin daily can help prevent Heart  disease and Stroke.  Flu shot- Every fall.  Tetanus- Every 10 years.  Zostavax- Once after the age of 4 to prevent Shingles.  Pneumonia shot- Once after the age of 104; if you are younger than 69, ask your healthcare provider if you need a Pneumonia shot.  Take these steps  Don't smoke- If you do smoke, talk to your doctor about quitting.  For tips on how to quit, go to www.smokefree.gov or call 1-800-QUIT-NOW.  Be physically active- Exercise 5 days a week for at least 30 minutes.  If you are not already physically active start slow and gradually work up to 30 minutes of moderate physical activity.  Examples of moderate activity include walking briskly, mowing the yard, dancing, swimming, bicycling, etc.  Eat a healthy diet- Eat a variety of healthy food such as fruits, vegetables, low fat milk, low fat cheese, yogurt, lean meant, poultry, fish, beans, tofu, etc. For more information go to www.thenutritionsource.org  Drink alcohol in moderation- Limit alcohol intake to less than two drinks a day. Never drink and drive.  Dentist- Brush and floss twice daily; visit your dentist twice a year.  Depression- Your emotional health is as important as your physical health. If you're feeling down, or losing interest in things you would normally enjoy please talk to your healthcare provider.  Eye exam- Visit your eye doctor every year.  Safe sex- If you may be exposed to a sexually transmitted infection, use a condom.  Seat belts- Seat belts can save your life; always wear one.  Smoke/Carbon Monoxide detectors- These detectors need to be installed on the appropriate level of your home.  Replace batteries at least once a year.  Skin cancer- When out in the sun, cover up and use sunscreen 15 SPF or higher.  Violence- If anyone is threatening you, please tell your healthcare provider.  Living Will/ Health care power of attorney- Speak with your healthcare provider and family.

## 2014-03-05 ENCOUNTER — Encounter: Payer: Self-pay | Admitting: Gastroenterology

## 2014-03-05 LAB — COMPLETE METABOLIC PANEL WITH GFR
ALBUMIN: 4.1 g/dL (ref 3.5–5.2)
ALK PHOS: 52 U/L (ref 39–117)
ALT: 48 U/L (ref 0–53)
AST: 37 U/L (ref 0–37)
BUN: 8 mg/dL (ref 6–23)
CO2: 26 meq/L (ref 19–32)
Calcium: 8.9 mg/dL (ref 8.4–10.5)
Chloride: 102 mEq/L (ref 96–112)
Creat: 0.85 mg/dL (ref 0.50–1.35)
GFR, Est Non African American: >89 mL/min
GLUCOSE: 92 mg/dL (ref 70–99)
POTASSIUM: 4 meq/L (ref 3.5–5.3)
SODIUM: 137 meq/L (ref 135–145)
TOTAL PROTEIN: 7 g/dL (ref 6.0–8.3)
Total Bilirubin: 0.5 mg/dL (ref 0.2–1.2)

## 2014-03-05 LAB — MICROALBUMIN, URINE: Microalb, Ur: 10.89 mg/dL — ABNORMAL HIGH (ref 0.00–1.89)

## 2014-03-05 LAB — TSH: TSH: 1.003 u[IU]/mL (ref 0.350–4.500)

## 2014-03-05 LAB — LIPID PANEL
CHOLESTEROL: 153 mg/dL (ref 0–200)
HDL: 47 mg/dL (ref >39)
LDL Cholesterol: 87 mg/dL (ref 0–99)
Total CHOL/HDL Ratio: 3.3 Ratio
Triglycerides: 93 mg/dL (ref <150)
VLDL: 19 mg/dL (ref 0–40)

## 2014-03-05 LAB — CBC WITH DIFFERENTIAL/PLATELET
BASOS PCT: 1 % (ref 0–1)
Basophils Absolute: 0.1 10*3/uL (ref 0.0–0.1)
EOS ABS: 0.2 10*3/uL (ref 0.0–0.7)
EOS PCT: 3 % (ref 0–5)
HCT: 42.4 % (ref 39.0–52.0)
HEMOGLOBIN: 14.1 g/dL (ref 13.0–17.0)
Lymphocytes Relative: 44 % (ref 12–46)
Lymphs Abs: 2.2 10*3/uL (ref 0.7–4.0)
MCH: 24.8 pg — ABNORMAL LOW (ref 26.0–34.0)
MCHC: 33.3 g/dL (ref 30.0–36.0)
MCV: 74.5 fL — AB (ref 78.0–100.0)
MONO ABS: 0.4 10*3/uL (ref 0.1–1.0)
MONOS PCT: 8 % (ref 3–12)
NEUTROS PCT: 44 % (ref 43–77)
Neutro Abs: 2.2 10*3/uL (ref 1.7–7.7)
Platelets: 192 10*3/uL (ref 150–400)
RBC: 5.69 MIL/uL (ref 4.22–5.81)
RDW: 17.2 % — ABNORMAL HIGH (ref 11.5–15.5)
WBC: 5 10*3/uL (ref 4.0–10.5)

## 2014-03-05 LAB — PSA: PSA: 2.04 ng/mL (ref <=4.00)

## 2014-05-14 ENCOUNTER — Ambulatory Visit (INDEPENDENT_AMBULATORY_CARE_PROVIDER_SITE_OTHER): Payer: Managed Care, Other (non HMO) | Admitting: Gastroenterology

## 2014-05-14 ENCOUNTER — Encounter: Payer: Self-pay | Admitting: Gastroenterology

## 2014-05-14 VITALS — BP 110/80 | HR 84 | Ht 69.5 in | Wt 206.4 lb

## 2014-05-14 DIAGNOSIS — K219 Gastro-esophageal reflux disease without esophagitis: Secondary | ICD-10-CM

## 2014-05-14 DIAGNOSIS — Z1211 Encounter for screening for malignant neoplasm of colon: Secondary | ICD-10-CM

## 2014-05-14 MED ORDER — MOVIPREP 100 G PO SOLR
1.0000 | Freq: Once | ORAL | Status: DC
Start: 2014-05-14 — End: 2014-05-21

## 2014-05-14 NOTE — Patient Instructions (Signed)
You will be set up for a colonoscopy for colon cancer screening. Stay on nexium, one pill once daily.

## 2014-05-14 NOTE — Progress Notes (Signed)
HPI: This is a   very pleasant 53 year old man whom I meeting for the first time today  Has heartburn for several months.  No dysphagia.  Overall weight has been stable.  Takes nexium, once daily. This works very well.     He had a colonoscopy 8-10 years ago in [place].  This was for blood in the stool.  He is not sure what was found.  Has never had upper endoscopy.    Review of systems: Pertinent positive and negative review of systems were noted in the above HPI section. Complete review of systems was performed and was otherwise normal.    Past Medical History  Diagnosis Date  . Diabetes mellitus without complication   . Hypertension   . Hyperlipidemia   . GERD (gastroesophageal reflux disease)     History reviewed. No pertinent past surgical history.  Current Outpatient Prescriptions  Medication Sig Dispense Refill  . aspirin 81 MG tablet Take 81 mg by mouth daily.      Marland Kitchen esomeprazole (NEXIUM) 40 MG capsule Take 1 capsule (40 mg total) by mouth daily.  90 capsule  1  . glimepiride (AMARYL) 2 MG tablet Take 1 tablet (2 mg total) by mouth daily before breakfast.  90 tablet  1  . losartan (COZAAR) 50 MG tablet Take 1 tablet (50 mg total) by mouth daily.  90 tablet  1  . metFORMIN (GLUCOPHAGE) 1000 MG tablet Take 1 tablet (1,000 mg total) by mouth 2 (two) times daily with a meal.  180 tablet  1  . simvastatin (ZOCOR) 20 MG tablet Take 1 tablet (20 mg total) by mouth every evening.  90 tablet  1  . sitaGLIPtin (JANUVIA) 100 MG tablet Take 1 tablet (100 mg total) by mouth daily.  90 tablet  1  . VIAGRA 100 MG tablet        No current facility-administered medications for this visit.    Allergies as of 05/14/2014 - Review Complete 05/14/2014  Allergen Reaction Noted  . Ace inhibitors Itching 07/31/2012    Family History  Problem Relation Age of Onset  . Family history unknown: Yes    History   Social History  . Marital Status: Married    Spouse Name: N/A   Number of Children: 3  . Years of Education: N/A   Occupational History  . Cook    Social History Main Topics  . Smoking status: Never Smoker   . Smokeless tobacco: Never Used  . Alcohol Use: No  . Drug Use: No  . Sexual Activity: Yes   Other Topics Concern  . Not on file   Social History Narrative   Married. Education: [employer]. Exercise: Walks twice a week for 1-2 hours.       Physical Exam: BP 110/80  Pulse 84  Ht 5' 9.5" (1.765 m)  Wt 206 lb 6.4 oz (93.622 kg)  BMI 30.05 kg/m2 Constitutional: generally well-appearing Psychiatric: alert and oriented x3 Eyes: extraocular movements intact Mouth: oral pharynx moist, no lesions Neck: supple no lymphadenopathy Cardiovascular: heart regular rate and rhythm Lungs: clear to auscultation bilaterally Abdomen: soft, nontender, nondistended, no obvious ascites, no peritoneal signs, normal bowel sounds Extremities: no lower extremity edema bilaterally Skin: no lesions on visible extremities    Assessment and plan: 53 y.o. male with  relatively new GERD that is well controlled on proton pump inhibitor, no alarm symptoms. Routine risk for colon cancer  I explained to him that he is at low risk for complications  of GERD. He is a nonsmoker, he does not drink alcohol he has no alarm symptoms of dysphasia, weight loss or overt GI bleeding. I don't think he necessarily need the at this point instead recommended he continue proton pump inhibitor once daily. He is due for colon cancer screening and we'll set him up for colonoscopy at his soonest convenience.Marland Kitchen

## 2014-05-21 ENCOUNTER — Encounter: Payer: Self-pay | Admitting: Gastroenterology

## 2014-05-21 ENCOUNTER — Ambulatory Visit (AMBULATORY_SURGERY_CENTER): Payer: Managed Care, Other (non HMO) | Admitting: Gastroenterology

## 2014-05-21 VITALS — BP 130/95 | HR 72 | Temp 96.7°F | Resp 19 | Ht 69.0 in | Wt 206.0 lb

## 2014-05-21 DIAGNOSIS — R933 Abnormal findings on diagnostic imaging of other parts of digestive tract: Secondary | ICD-10-CM

## 2014-05-21 DIAGNOSIS — D126 Benign neoplasm of colon, unspecified: Secondary | ICD-10-CM

## 2014-05-21 DIAGNOSIS — K599 Functional intestinal disorder, unspecified: Secondary | ICD-10-CM

## 2014-05-21 DIAGNOSIS — Z1211 Encounter for screening for malignant neoplasm of colon: Secondary | ICD-10-CM

## 2014-05-21 MED ORDER — SODIUM CHLORIDE 0.9 % IV SOLN: 500.0000 mL | INTRAVENOUS | Status: DC

## 2014-05-21 NOTE — Progress Notes (Signed)
A/ox3 pleased with MAC, report to April RN 

## 2014-05-21 NOTE — Op Note (Signed)
[place]  [address] [place], [id number]   COLONOSCOPY PROCEDURE REPORT  PATIENT: Wayne Perez, Wayne Perez  MR#: [id number] BIRTHDATE: 1960/09/20 , 52  yrs. old GENDER: Male ENDOSCOPIST: Milus Banister, MD REFERRED MG:[id number] Greene, M.D. PROCEDURE DATE:  05/21/2014 PROCEDURE:   Colonoscopy with biopsy and Colonoscopy with snare polypectomy First Screening Colonoscopy - Avg.  risk and is 50 yrs.  old or older Yes.  Prior Negative Screening - Now for repeat screening. N/A  History of Adenoma - Now for follow-up colonoscopy & has been > or = to 3 yrs.  N/A  Polyps Removed Today? Yes. ASA CLASS:   Class II INDICATIONS:average risk screening. MEDICATIONS: MAC sedation, administered by CRNA and propofol (Diprivan) 200mg  IV  DESCRIPTION OF PROCEDURE:   After the risks benefits and alternatives of the procedure were thoroughly explained, informed consent was obtained.  A digital rectal exam revealed no abnormalities of the rectum.   The LB [id number]-[id number]  endoscope was introduced through the anus and advanced to the terminal ileum which was intubated for a short distance. No adverse events experienced.   The quality of the prep was excellent.  The instrument was then slowly withdrawn as the colon was fully examined.  COLON FINDINGS: One polyp was found, removed and sent to pathology. This was sessile, 2-31mm across, located in rectum, removed with cold snare (jar 3).  The colonic mucosa was mildly inflammed appearing throughout all segments (granular and a bit friable). The terminal ileum was normal.  Biopsies were taken from right (jar 1) and left (jar 2) colon.  The examination was otherwise normal. Retroflexed views revealed no abnormalities. The time to cecum=1 minutes 44 seconds.  Withdrawal time=8 minutes 40 seconds.  The scope was withdrawn and the procedure completed. COMPLICATIONS: There were no complications.  ENDOSCOPIC IMPRESSION: One polyp was found,  removed and sent to pathology.  The colon was incidentally noted to be mildly inflammed throughout.  Biopsies were taken, see above.  The examination was otherwise normal.  RECOMMENDATIONS: If the polyp(s) removed today are proven to be adenomatous (pre-cancerous) polyps, you will need a repeat colonoscopy in 5 years.  Otherwise you should continue to follow colorectal cancer screening guidelines for "routine risk" patients with colonoscopy in 10 years.  You will receive a letter within 1-2 weeks with the results of your biopsy as well as final recommendations.  Please call my office if you have not received a letter after 3 weeks.   eSigned:  Milus Banister, MD 05/21/2014 10:25 AM

## 2014-05-21 NOTE — Progress Notes (Signed)
Called to room to assist during endoscopic procedure.  Patient ID and intended procedure confirmed with present staff. Received instructions for my participation in the procedure from the performing physician.  

## 2014-05-21 NOTE — Patient Instructions (Signed)
YOU HAD AN ENDOSCOPIC PROCEDURE TODAY AT THE [facility]: Refer to the procedure report that was given to you for any specific questions about what was found during the examination.  If the procedure report does not answer your questions, please call your gastroenterologist to clarify.  If you requested that your care partner not be given the details of your procedure findings, then the procedure report has been included in a sealed envelope for you to review at your convenience later.  YOU SHOULD EXPECT: Some feelings of bloating in the abdomen. Passage of more gas than usual.  Walking can help get rid of the air that was put into your GI tract during the procedure and reduce the bloating. If you had a lower endoscopy (such as a colonoscopy or flexible sigmoidoscopy) you may notice spotting of blood in your stool or on the toilet paper. If you underwent a bowel prep for your procedure, then you may not have a normal bowel movement for a few days.  DIET: Your first meal following the procedure should be a light meal and then it is ok to progress to your normal diet.  A half-sandwich or bowl of soup is an example of a good first meal.  Heavy or fried foods are harder to digest and may make you feel nauseous or bloated.  Likewise meals heavy in dairy and vegetables can cause extra gas to form and this can also increase the bloating.  Drink plenty of fluids but you should avoid alcoholic beverages for 24 hours.  ACTIVITY: Your care partner should take you home directly after the procedure.  You should plan to take it easy, moving slowly for the rest of the day.  You can resume normal activity the day after the procedure however you should NOT DRIVE or use heavy machinery for 24 hours (because of the sedation medicines used during the test).    SYMPTOMS TO REPORT IMMEDIATELY: A gastroenterologist can be reached at any hour.  During normal business hours, 8:30 AM to 5:00 PM Monday through Friday,  call [phone].  After hours and on weekends, please call the GI answering service at [phone] who will take a message and have the physician on call contact you.   Following lower endoscopy (colonoscopy or flexible sigmoidoscopy):  Excessive amounts of blood in the stool  Significant tenderness or worsening of abdominal pains  Swelling of the abdomen that is new, acute  Fever of 100F or higher   FOLLOW UP: If any biopsies were taken you will be contacted by phone or by letter within the next 1-3 weeks.  Call your gastroenterologist if you have not heard about the biopsies in 3 weeks.  Our staff will call the home number listed on your records the next business day following your procedure to check on you and address any questions or concerns that you may have at that time regarding the information given to you following your procedure. This is a courtesy call and so if there is no answer at the home number and we have not heard from you through the emergency physician on call, we will assume that you have returned to your regular daily activities without incident.  SIGNATURES/CONFIDENTIALITY: You and/or your care partner have signed paperwork which will be entered into your electronic medical record.  These signatures attest to the fact that that the information above on your After Visit Summary has been reviewed and is understood.  Full responsibility of the confidentiality of   this discharge information lies with you and/or your care-partner.  Polyp-handout given  Repeat colonoscopy will be determined by pathology   

## 2014-05-22 ENCOUNTER — Telehealth: Payer: Self-pay | Admitting: *Deleted

## 2014-05-22 NOTE — Telephone Encounter (Signed)
Left message that we called for f/u 

## 2014-05-29 ENCOUNTER — Encounter: Payer: Self-pay | Admitting: Gastroenterology

## 2014-06-03 ENCOUNTER — Ambulatory Visit (INDEPENDENT_AMBULATORY_CARE_PROVIDER_SITE_OTHER): Payer: Managed Care, Other (non HMO)

## 2014-06-03 ENCOUNTER — Ambulatory Visit (INDEPENDENT_AMBULATORY_CARE_PROVIDER_SITE_OTHER): Payer: Managed Care, Other (non HMO) | Admitting: Emergency Medicine

## 2014-06-03 VITALS — BP 136/88 | HR 87 | Temp 98.1°F | Resp 17 | Ht 69.5 in | Wt 208.0 lb

## 2014-06-03 DIAGNOSIS — S139XXA Sprain of joints and ligaments of unspecified parts of neck, initial encounter: Secondary | ICD-10-CM

## 2014-06-03 DIAGNOSIS — M542 Cervicalgia: Secondary | ICD-10-CM

## 2014-06-03 DIAGNOSIS — S161XXA Strain of muscle, fascia and tendon at neck level, initial encounter: Secondary | ICD-10-CM

## 2014-06-03 MED ORDER — NAPROXEN SODIUM 550 MG PO TABS: 550.0000 mg | ORAL_TABLET | Freq: Two times a day (BID) | ORAL | Status: DC

## 2014-06-03 MED ORDER — ACETAMINOPHEN-CODEINE #3 300-30 MG PO TABS: 1.0000 | ORAL_TABLET | ORAL | Status: DC | PRN

## 2014-06-03 MED ORDER — CYCLOBENZAPRINE HCL 10 MG PO TABS: 10.0000 mg | ORAL_TABLET | Freq: Three times a day (TID) | ORAL | Status: DC | PRN
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
## 2014-06-03 NOTE — Patient Instructions (Signed)
Cervical Cervical Sprain A cervical sprain is an injury in the neck in which the strong, fibrous tissues (ligaments) that connect your neck bones stretch or tear. Cervical sprains can range from mild to severe. Severe cervical sprains can cause the neck vertebrae to be unstable. This can lead to damage of the spinal cord and can result in serious nervous system problems. The amount of time it takes for a cervical sprain to get better depends on the cause and extent of the injury. Most cervical sprains heal in 1 to 3 weeks. CAUSES  Severe cervical sprains may be caused by:   Contact sport injuries (such as from football, rugby, wrestling, hockey, auto racing, gymnastics, diving, martial arts, or boxing).   Motor vehicle collisions.   Whiplash injuries. This is an injury from a sudden forward and backward whipping movement of the head and neck.  Falls.  Mild cervical sprains may be caused by:   Being in an awkward position, such as while cradling a telephone between your ear and shoulder.   Sitting in a chair that does not offer proper support.   Working at a poorly [employer]scape [employer] station.   Looking up or down for long periods of time.  SYMPTOMS   Pain, soreness, stiffness, or a burning sensation in the front, back, or sides of the neck. This discomfort may develop immediately after the injury or slowly, 24 hours or more after the injury.   Pain or tenderness directly in the middle of the back of the neck.   Shoulder or upper back pain.   Limited ability to move the neck.   Headache.   Dizziness.   Weakness, numbness, or tingling in the hands or arms.   Muscle spasms.   Difficulty swallowing or chewing.   Tenderness and swelling of the neck.  DIAGNOSIS  Most of the time your health care provider can diagnose a cervical sprain by taking your history and doing a physical exam. Your health care provider will ask about previous neck injuries and any known  neck problems, such as arthritis in the neck. X-rays may be taken to find out if there are any other problems, such as with the bones of the neck. Other tests, such as a CT scan or MRI, may also be needed.  TREATMENT  Treatment depends on the severity of the cervical sprain. Mild sprains can be treated with rest, keeping the neck in place (immobilization), and pain medicines. Severe cervical sprains are immediately immobilized. Further treatment is done to help with pain, muscle spasms, and other symptoms and may include:  Medicines, such as pain relievers, numbing medicines, or muscle relaxants.   Physical therapy. This may involve stretching exercises, strengthening exercises, and posture training. Exercises and improved posture can help stabilize the neck, strengthen muscles, and help stop symptoms from returning.  HOME CARE INSTRUCTIONS   Put ice on the injured area.   Put ice in a plastic bag.   Place a towel between your skin and the bag.   Leave the ice on for 15-20 minutes, 3-4 times a day.   If your injury was severe, you may have been given a cervical collar to wear. A cervical collar is a two-piece collar designed to keep your neck from moving while it heals.  Do not remove the collar unless instructed by your health care provider.  If you have long hair, keep it outside of the collar.  Ask your health care provider before making any adjustments to your collar.  Minor adjustments may be required over time to improve comfort and reduce pressure on your chin or on the back of your head.  Ifyou are allowed to remove the collar for cleaning or bathing, follow your health care provider's instructions on how to do so safely.  Keep your collar clean by wiping it with mild soap and water and drying it completely. If the collar you have been given includes removable pads, remove them every 1-2 days and hand wash them with soap and water. Allow them to air dry. They should be  completely dry before you wear them in the collar.  If you are allowed to remove the collar for cleaning and bathing, wash and dry the skin of your neck. Check your skin for irritation or sores. If you see any, tell your health care provider.  Do not drive while wearing the collar.   Only take over-the-counter or prescription medicines for pain, discomfort, or fever as directed by your health care provider.   Keep all follow-up appointments as directed by your health care provider.   Keep all physical therapy appointments as directed by your health care provider.   Make any needed adjustments to your workstation to promote good posture.   Avoid positions and activities that make your symptoms worse.   Warm up and stretch before being active to help prevent problems.  SEEK MEDICAL CARE IF:   Your pain is not controlled with medicine.   You are unable to decrease your pain medicine over time as planned.   Your activity level is not improving as expected.  SEEK IMMEDIATE MEDICAL CARE IF:   You develop any bleeding.  You develop stomach upset.  You have signs of an allergic reaction to your medicine.   Your symptoms get worse.   You develop new, unexplained symptoms.   You have numbness, tingling, weakness, or paralysis in any part of your body.  MAKE SURE YOU:   Understand these instructions.  Will watch your condition.  Will get help right away if you are not doing well or get worse. Document Released: 06/20/2007 Document Revised: 08/28/2013 Document Reviewed: 02/28/2013 [facility] Patient Information 2015 [place], [place]. This information is not intended to replace advice given to you by your health care provider. Make sure you discuss any questions you have with your health care provider.

## 2014-06-14 ENCOUNTER — Ambulatory Visit (INDEPENDENT_AMBULATORY_CARE_PROVIDER_SITE_OTHER): Payer: Managed Care, Other (non HMO) | Admitting: Emergency Medicine

## 2014-06-14 VITALS — BP 150/90 | HR 86 | Temp 98.1°F | Resp 16 | Ht 69.0 in | Wt 207.4 lb

## 2014-06-14 DIAGNOSIS — S46812D Strain of other muscles, fascia and tendons at shoulder and upper arm level, left arm, subsequent encounter: Secondary | ICD-10-CM

## 2014-06-14 MED ORDER — CYCLOBENZAPRINE HCL 10 MG PO TABS: 10.0000 mg | ORAL_TABLET | Freq: Three times a day (TID) | ORAL | Status: DC | PRN

## 2014-06-14 MED ORDER — ACETAMINOPHEN-CODEINE #3 300-30 MG PO TABS: 1.0000 | ORAL_TABLET | ORAL | Status: DC | PRN
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
## 2014-06-14 NOTE — Patient Instructions (Signed)

## 2014-09-09 ENCOUNTER — Encounter: Payer: Self-pay | Admitting: Family Medicine

## 2014-09-09 ENCOUNTER — Ambulatory Visit (INDEPENDENT_AMBULATORY_CARE_PROVIDER_SITE_OTHER): Payer: Managed Care, Other (non HMO) | Admitting: Family Medicine

## 2014-09-09 VITALS — BP 135/91 | HR 78 | Temp 98.2°F | Resp 16 | Ht 68.5 in | Wt 206.8 lb

## 2014-09-09 DIAGNOSIS — E119 Type 2 diabetes mellitus without complications: Secondary | ICD-10-CM

## 2014-09-09 DIAGNOSIS — K219 Gastro-esophageal reflux disease without esophagitis: Secondary | ICD-10-CM

## 2014-09-09 DIAGNOSIS — I10 Essential (primary) hypertension: Secondary | ICD-10-CM

## 2014-09-09 LAB — GLUCOSE, POCT (MANUAL RESULT ENTRY): POC GLUCOSE: 99 mg/dL (ref 70–99)

## 2014-09-09 LAB — POCT GLYCOSYLATED HEMOGLOBIN (HGB A1C): Hemoglobin A1C: 7.7

## 2014-09-09 MED ORDER — SITAGLIPTIN PHOSPHATE 100 MG PO TABS: 100.0000 mg | ORAL_TABLET | Freq: Every day | ORAL | Status: DC

## 2014-09-09 MED ORDER — BLOOD GLUCOSE MONITORING SUPPL KIT: PACK | Status: DC

## 2014-09-09 MED ORDER — GLIMEPIRIDE 2 MG PO TABS: 2.0000 mg | ORAL_TABLET | Freq: Every day | ORAL | Status: DC

## 2014-09-09 MED ORDER — METFORMIN HCL 1000 MG PO TABS: 1000.0000 mg | ORAL_TABLET | Freq: Two times a day (BID) | ORAL | Status: DC

## 2014-09-09 MED ORDER — LOSARTAN POTASSIUM 100 MG PO TABS: 100.0000 mg | ORAL_TABLET | Freq: Every day | ORAL | Status: DC

## 2014-09-09 NOTE — Patient Instructions (Addendum)
We will increase your losartan to 100mg  each day as your blood pressures have been running a little higher.  This should also help protect the kidneys. Plan on recheck kidney function test and diabetes in next 3 months.  Plan on fasting visit at that time to look at your cholesterol as well. Keep a record of your blood pressures outside of the office and bring them to the next office visit. If any new lightheadedness or dizziness.   Your diabetes is not as well controlled as last visit. Work on diet changes, exercise and check your blood sugars over then next 6 weeks (different times of the day).  If still running over 150 - call me to discuss medication changes. If any low blood sugar symptoms - call me about these as well.   Return to the clinic or go to the nearest emergency room if any of your symptoms worsen or new symptoms occur.

## 2014-09-09 NOTE — Progress Notes (Signed)
Subjective:    Patient ID: Wayne Perez, male    DOB: 1961/07/08, 54 y.o.   MRN: [id number] This chart was scribed for [name], MD by Zola Button, Medical Scribe. This patient was seen in Room 24 and the patient's care was started at 1:33 PM.   HPI HPI Comments: Wayne Perez is a 54 y.o. male who presents to the [facility] for a follow-up for DM and HTN.  HTN: Last renal function in June 2015, normal with EGFR > 89. He takes ASA 81 mg QD and Losartan 50 mg QD. Patient has not been checking his blood pressure at home. The last time he checked it, it was 130/91. Patient denies any side effects from his medications.  DM type II: Last A1C in June, 6.7. Urine microalbumin was elevated at 10.89 in June. Continued on ARB. Lipid panel was normal with LDL 87 in June. He takes metformin 1000 mg BID, Genevia 100 mg QD and Amaryl 2 mg QAM. He has been checking his blood sugars at home sometimes, the lowest he has gotten is 130. He feels his sugars has run low 1-2 times since June, but he did not have the meter with him at the time. Patient states he has been compliant with his medications.  GERD: Takes Nexium 40 mg QD. Followed by Dr. Ardis Hughs. He has had a colonoscopy in September 2015. That indicated 1 polyp, benign colonic mucosa. Planned repeat colonoscopy in 10 years.   Health Maintenance: He has appointment with eye doctor next week. Patient sees his dentist every 6 months. He has not had anything to eat or drink today.  Patient Active Problem List   Diagnosis Date Noted  . Diabetes 07/31/2012  . HTN (hypertension) 07/31/2012   Past Medical History  Diagnosis Date  . Diabetes mellitus without complication   . Hypertension   . Hyperlipidemia   . GERD (gastroesophageal reflux disease)    Past Surgical History  Procedure Laterality Date  . Colonoscopy     Allergies  Allergen Reactions  . Ace Inhibitors Itching   Prior to Admission medications   Medication Sig  Start Date End Date Taking? Authorizing Provider  esomeprazole (NEXIUM) 40 MG capsule Take 1 capsule (40 mg total) by mouth daily. 03/04/14  Yes [name], MD  glimepiride (AMARYL) 2 MG tablet Take 1 tablet (2 mg total) by mouth daily before breakfast. 03/04/14  Yes [name], MD  losartan (COZAAR) 50 MG tablet Take 1 tablet (50 mg total) by mouth daily. 03/04/14  Yes [name], MD  metFORMIN (GLUCOPHAGE) 1000 MG tablet Take 1 tablet (1,000 mg total) by mouth 2 (two) times daily with a meal. 03/04/14  Yes [name], MD  simvastatin (ZOCOR) 20 MG tablet Take 1 tablet (20 mg total) by mouth every evening. 03/04/14  Yes [name], MD  sitaGLIPtin (JANUVIA) 100 MG tablet Take 1 tablet (100 mg total) by mouth daily. 03/04/14  Yes [name], MD  aspirin 81 MG tablet Take 81 mg by mouth daily.    Historical Provider, MD   History   Social History  . Marital Status: Married    Spouse Name: N/A    Number of Children: 3  . Years of Education: N/A   Occupational History  . Cook    Social History Main Topics  . Smoking status: Never Smoker   . Smokeless tobacco: Never Used  . Alcohol Use: No  . Drug Use:  No  . Sexual Activity: Yes   Other Topics Concern  . Not on file   Social History Narrative   Married. Education: [employer]. Exercise: Walks twice a week for 1-2 hours.      Review of Systems  Constitutional: Negative for fatigue and unexpected weight change.  Eyes: Negative for visual disturbance.  Respiratory: Negative for cough, chest tightness and shortness of breath.   Cardiovascular: Negative for chest pain, palpitations and leg swelling.  Gastrointestinal: Negative for abdominal pain and blood in stool.  Neurological: Negative for dizziness, light-headedness and headaches.       Objective:   Physical Exam  Constitutional: He is oriented to person, place, and time. He appears well-developed and well-nourished.  HENT:  Head: Normocephalic  and atraumatic.  Eyes: EOM are normal. Pupils are equal, round, and reactive to light.  Neck: No JVD present. Carotid bruit is not present.  Cardiovascular: Normal rate, regular rhythm and normal heart sounds.   No murmur heard. Pulmonary/Chest: Effort normal and breath sounds normal. He has no rales.  Musculoskeletal: He exhibits no edema.  Neurological: He is alert and oriented to person, place, and time.  Skin: Skin is warm and dry.  Psychiatric: He has a normal mood and affect.  Vitals reviewed.     Filed Vitals:   09/09/14 1316  BP: 135/91  Pulse: 78  Temp: 98.2 F (36.8 C)  TempSrc: Oral  Resp: 16  Height: 5' 8.5" (1.74 m)  Weight: 206 lb 12.8 oz (93.804 kg)  SpO2: 100%   Results for orders placed or performed in visit on 09/09/14  POCT glucose (manual entry)  Result Value Ref Range   POC Glucose 99 70 - 99 mg/dl  POCT glycosylated hemoglobin (Hb A1C)  Result Value Ref Range   Hemoglobin A1C 7.7        Assessment & Plan:   Wayne Perez is a 54 y.o. male Type 2 diabetes mellitus without complication - Plan: POCT glucose (manual entry), POCT glycosylated hemoglobin (Hb A1C), Blood Glucose Monitoring Suppl KIT, metFORMIN (GLUCOPHAGE) 1000 MG tablet, glimepiride (AMARYL) 2 MG tablet, sitaGLIPtin (JANUVIA) 100 MG tablet  -decreased control, but glucose 99 in office. Hold on med changes for now, but to check home blood sugar readings, watch diet and exercise and recheck in 3 months.   Essential hypertension - Plan: losartan (COZAAR) 100 MG tablet  -increase losartan for improved control, especially with DM and microablbuminuria. Orthostatic precautions discussed.   Gastroesophageal reflux disease, esophagitis presence not specified  -stable, continue nexium.   Meds ordered this encounter  Medications  . Blood Glucose Monitoring Suppl KIT    Sig: One Touch Ultra or may substitute for what insurance covers.  May check once per day    Dispense:  100 each    Refill:   6  . losartan (COZAAR) 100 MG tablet    Sig: Take 1 tablet (100 mg total) by mouth daily.    Dispense:  90 tablet    Refill:  1  . metFORMIN (GLUCOPHAGE) 1000 MG tablet    Sig: Take 1 tablet (1,000 mg total) by mouth 2 (two) times daily with a meal.    Dispense:  180 tablet    Refill:  1  . glimepiride (AMARYL) 2 MG tablet    Sig: Take 1 tablet (2 mg total) by mouth daily before breakfast.    Dispense:  90 tablet    Refill:  1  . sitaGLIPtin (JANUVIA) 100 MG tablet    Sig:  Take 1 tablet (100 mg total) by mouth daily.    Dispense:  90 tablet    Refill:  1   Patient Instructions  We will increase your losartan to 140m each day as your blood pressures have been running a little higher.  This should also help protect the kidneys. Plan on recheck kidney function test and diabetes in next 3 months.  Plan on fasting visit at that time to look at your cholesterol as well. Keep a record of your blood pressures outside of the office and bring them to the next office visit. If any new lightheadedness or dizziness.   Your diabetes is not as well controlled as last visit. Work on diet changes, exercise and check your blood sugars over then next 6 weeks (different times of the day).  If still running over 150 - call me to discuss medication changes. If any low blood sugar symptoms - call me about these as well.   Return to the clinic or go to the nearest emergency room if any of your symptoms worsen or new symptoms occur.     I personally performed the services described in this documentation, which was scribed in my presence. The recorded information has been reviewed and considered, and addended by me as needed.

## 2014-10-11 ENCOUNTER — Other Ambulatory Visit: Payer: Self-pay | Admitting: Family Medicine

## 2014-11-14 ENCOUNTER — Other Ambulatory Visit: Payer: Self-pay | Admitting: Family Medicine

## 2014-12-09 ENCOUNTER — Ambulatory Visit: Payer: Managed Care, Other (non HMO) | Admitting: Family Medicine

## 2015-01-06 ENCOUNTER — Other Ambulatory Visit: Payer: Self-pay | Admitting: Physician Assistant

## 2015-02-17 ENCOUNTER — Other Ambulatory Visit: Payer: Self-pay | Admitting: Family Medicine

## 2015-03-03 ENCOUNTER — Ambulatory Visit (INDEPENDENT_AMBULATORY_CARE_PROVIDER_SITE_OTHER): Payer: Managed Care, Other (non HMO) | Admitting: Family Medicine

## 2015-03-03 VITALS — BP 121/79 | HR 77 | Temp 98.0°F | Resp 18 | Ht 68.5 in | Wt 205.6 lb

## 2015-03-03 DIAGNOSIS — I10 Essential (primary) hypertension: Secondary | ICD-10-CM

## 2015-03-03 DIAGNOSIS — E119 Type 2 diabetes mellitus without complications: Secondary | ICD-10-CM

## 2015-03-03 DIAGNOSIS — E785 Hyperlipidemia, unspecified: Secondary | ICD-10-CM | POA: Diagnosis not present

## 2015-03-03 DIAGNOSIS — R809 Proteinuria, unspecified: Secondary | ICD-10-CM | POA: Diagnosis not present

## 2015-03-03 LAB — POCT GLYCOSYLATED HEMOGLOBIN (HGB A1C): Hemoglobin A1C: 7.4

## 2015-03-03 LAB — COMPREHENSIVE METABOLIC PANEL
ALK PHOS: 57 U/L (ref 39–117)
ALT: 39 U/L (ref 0–53)
AST: 33 U/L (ref 0–37)
Albumin: 4.1 g/dL (ref 3.5–5.2)
BILIRUBIN TOTAL: 0.7 mg/dL (ref 0.2–1.2)
BUN: 11 mg/dL (ref 6–23)
CO2: 29 mEq/L (ref 19–32)
Calcium: 9.5 mg/dL (ref 8.4–10.5)
Chloride: 101 mEq/L (ref 96–112)
Creat: 0.92 mg/dL (ref 0.50–1.35)
Glucose, Bld: 108 mg/dL — ABNORMAL HIGH (ref 70–99)
Potassium: 4.1 mEq/L (ref 3.5–5.3)
Sodium: 138 mEq/L (ref 135–145)
TOTAL PROTEIN: 7.4 g/dL (ref 6.0–8.3)

## 2015-03-03 LAB — LIPID PANEL
CHOL/HDL RATIO: 2.5 ratio
Cholesterol: 135 mg/dL (ref 0–200)
HDL: 53 mg/dL (ref >=40)
LDL Cholesterol: 67 mg/dL (ref 0–99)
Triglycerides: 74 mg/dL (ref <150)
VLDL: 15 mg/dL (ref 0–40)

## 2015-03-03 LAB — GLUCOSE, POCT (MANUAL RESULT ENTRY): POC Glucose: 112 mg/dl — AB (ref 70–99)

## 2015-03-03 MED ORDER — LOSARTAN POTASSIUM 100 MG PO TABS
100.0000 mg | ORAL_TABLET | Freq: Every day | ORAL | Status: DC
Start: 2015-03-03 — End: 2015-05-12

## 2015-03-03 MED ORDER — SITAGLIPTIN PHOSPHATE 100 MG PO TABS
100.0000 mg | ORAL_TABLET | Freq: Every day | ORAL | Status: DC
Start: 2015-03-03 — End: 2015-05-12

## 2015-03-03 MED ORDER — GLIMEPIRIDE 2 MG PO TABS: 3.0000 mg | ORAL_TABLET | Freq: Every day | ORAL | Status: DC

## 2015-03-03 MED ORDER — METFORMIN HCL 1000 MG PO TABS: 1000.0000 mg | ORAL_TABLET | Freq: Two times a day (BID) | ORAL | Status: DC

## 2015-03-03 MED ORDER — SIMVASTATIN 20 MG PO TABS: ORAL_TABLET | ORAL | Status: DC

## 2015-03-03 MED ORDER — GLIMEPIRIDE 4 MG PO TABS: 2.0000 mg | ORAL_TABLET | Freq: Every day | ORAL | Status: DC

## 2015-03-03 NOTE — Patient Instructions (Addendum)
Stretches for knee as discussed. If these do not help your knee pain - return for recheck.   Diabetes still not controlled. Increase amaryl to 3mg  per day (one and one half tablets) . Watch out for low blood sugars. If this occurs - return to 2mg  dose and return to discuss other options.   You should receive a call or letter about your lab results within the next week to 10 days.   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  Return to the clinic or go to the nearest emergency room if any of your symptoms worsen or new symptoms occur.

## 2015-03-03 NOTE — Progress Notes (Addendum)
Subjective:  This chart was scribed for [name] MD, by [name], at [facility].  This patient was seen in room 3 and the patient's care was started at 12:17 PM.    Patient ID: Wayne Perez, male    DOB: July 02, 1961, 54 y.o.   MRN: [id number]  HPI  HPI Comments: Wayne Perez is a 55 y.o. male who presents to the [facility] for refill for his cholesterol medication and for a follow up regarding her hyperlipidemia and diabetes. His last visit was January 4th.  Patient has a history of GERD and hypertension.  Patient is fasting currently.  He is working currently as a Technical brewer at two different places.     Type 2 diabetes Lab Results  Component Value Date   HGBA1C 7.7 09/09/2014  History of elevated urine micro albumin at 10.89- 1 year ago.  He is taking losartan 100 mg QD for diabetes is on Amaryl 2 mg , Jannuvia 100 mg QD and Glucophage 1000 mg BID.  ---  He denies any complaints regarding his diabetes/ any symptoms related to his blood sugar levels.     Hyperlipidemia:  Lab Results  Component Value Date   CHOL 153 03/04/2014   HDL 47 03/04/2014   LDLCALC 87 03/04/2014   TRIG 93 03/04/2014   CHOLHDL 3.3 03/04/2014  Controlled 1 year ago on Zocor    Hypertension:  Lab Results  Component Value Date   CREATININE 0.85 03/04/2014  Losartan was increased last visit for blood pressure control and microalbuminuria.   ---  His blood pressure this morning was 121/79 and he denies any side effects with his medictions.    Knee pain: Patient has been having pains behind his left knee recently.  He denies any recent injuries.          Patient Active Problem List   Diagnosis Date Noted  . Diabetes 07/31/2012  . HTN (hypertension) 07/31/2012   Past Medical History  Diagnosis Date  . Diabetes mellitus without complication   . Hypertension   . Hyperlipidemia   . GERD (gastroesophageal reflux disease)    Past Surgical  History  Procedure Laterality Date  . Colonoscopy     Allergies  Allergen Reactions  . Ace Inhibitors Itching   Prior to Admission medications   Medication Sig Start Date End Date Taking? Authorizing Provider  aspirin 81 MG tablet Take 81 mg by mouth daily.   Yes Historical Provider, MD  Blood Glucose Monitoring Suppl KIT One Touch Ultra or may substitute for what insurance covers.  May check once per day 09/09/14  Yes [name], MD  esomeprazole (NEXIUM) 40 MG capsule take 1 capsule by mouth once daily 02/18/15  Yes [name], MD  glimepiride (AMARYL) 2 MG tablet Take 1 tablet (2 mg total) by mouth daily before breakfast. 09/09/14  Yes [name], MD  losartan (COZAAR) 100 MG tablet Take 1 tablet (100 mg total) by mouth daily. 09/09/14  Yes [name], MD  metFORMIN (GLUCOPHAGE) 1000 MG tablet Take 1 tablet (1,000 mg total) by mouth 2 (two) times daily with a meal. 09/09/14  Yes [name], MD  simvastatin (ZOCOR) 20 MG tablet TAKE 1 TABLET BY MOUTH EVERY EVENING.  "OV NEEDED FOR FURTHER REFILLS" 01/08/15  Yes Chelle Jeffery, PA-C  sitaGLIPtin (JANUVIA) 100 MG tablet Take 1 tablet (100 mg total) by mouth daily. 09/09/14  Yes [name], MD  History   Social History  . Marital Status: Married    Spouse Name: N/A  . Number of Children: 3  . Years of Education: N/A   Occupational History  . Cook    Social History Main Topics  . Smoking status: Never Smoker   . Smokeless tobacco: Never Used  . Alcohol Use: No  . Drug Use: No  . Sexual Activity: Yes   Other Topics Concern  . Not on file   Social History Narrative   Married. Education: [employer]. Exercise: Walks twice a week for 1-2 hours.        Review of Systems  Constitutional: Negative for fatigue and unexpected weight change.  Eyes: Negative for visual disturbance.  Respiratory: Negative for cough, chest tightness and shortness of breath.   Cardiovascular: Negative for chest pain,  palpitations and leg swelling.  Gastrointestinal: Negative for abdominal pain and blood in stool.  Neurological: Negative for dizziness, light-headedness and headaches.       Objective:   Physical Exam  Constitutional: He is oriented to person, place, and time. He appears well-developed and well-nourished.  HENT:  Head: Normocephalic and atraumatic.  Eyes: EOM are normal. Pupils are equal, round, and reactive to light.  Neck: No JVD present. Carotid bruit is not present.  Cardiovascular: Normal rate, regular rhythm and normal heart sounds.   No murmur heard. Pulmonary/Chest: Effort normal and breath sounds normal. He has no rales.  Musculoskeletal: He exhibits no edema.  Left knee has full range of motion  No effusion No bony tenderness  Described his knee pain as distal hamstring area.   Neurological: He is alert and oriented to person, place, and time.  Skin: Skin is warm and dry.  Psychiatric: He has a normal mood and affect.  Vitals reviewed.   Filed Vitals:   03/03/15 1202  BP: 121/79  Pulse: 77  Temp: 98 F (36.7 C)  TempSrc: Oral  Resp: 18  Height: 5' 8.5" (1.74 m)  Weight: 205 lb 9.6 oz (93.26 kg)  SpO2: 98%   Results for orders placed or performed in visit on 03/03/15  POCT glucose (manual entry)  Result Value Ref Range   POC Glucose 112 (A) 70 - 99 mg/dl  POCT glycosylated hemoglobin (Hb A1C)  Result Value Ref Range   Hemoglobin A1C 7.4        Assessment & Plan:   Wayne Perez is a 54 y.o. male Type 2 diabetes mellitus without complication - Plan: Lipid panel, Comprehensive metabolic panel, POCT glucose (manual entry), POCT glycosylated hemoglobin (Hb A1C), sitaGLIPtin (JANUVIA) 100 MG tablet, metFORMIN (GLUCOPHAGE) 1000 MG tablet, glimepiride (AMARYL) 2 MG tablet, DISCONTINUED: glimepiride (AMARYL) 4 MG tablet  -borderline, but not controlled. Small increase in Amaryl to 7m QD.  Continue same doses of Januvia and Metformin. Hypoglycemic precautions  reviewed.   Essential hypertension - Plan: Lipid panel, Comprehensive metabolic panel, losartan (COZAAR) 100 MG tablet  -stable, no med changes.   Microalbuminuria - Plan: Microalbumin, urine  - repeat microalbumin  Hyperlipidemia - Plan: simvastatin (ZOCOR) 20 MG tablet  - continue simvastatin at same dose for now - lipid panel pending.   Meds ordered this encounter  Medications  . DISCONTD: glimepiride (AMARYL) 4 MG tablet    Sig: Take 0.5 tablets (2 mg total) by mouth daily before breakfast.    Dispense:  90 tablet    Refill:  1  . simvastatin (ZOCOR) 20 MG tablet    Sig: TAKE 1 TABLET BY MOUTH EVERY EVENING.  Dispense:  90 tablet    Refill:  1  . sitaGLIPtin (JANUVIA) 100 MG tablet    Sig: Take 1 tablet (100 mg total) by mouth daily.    Dispense:  90 tablet    Refill:  1  . metFORMIN (GLUCOPHAGE) 1000 MG tablet    Sig: Take 1 tablet (1,000 mg total) by mouth 2 (two) times daily with a meal.    Dispense:  180 tablet    Refill:  1  . losartan (COZAAR) 100 MG tablet    Sig: Take 1 tablet (100 mg total) by mouth daily.    Dispense:  90 tablet    Refill:  1  . glimepiride (AMARYL) 2 MG tablet    Sig: Take 1.5 tablets (3 mg total) by mouth daily before breakfast.    Dispense:  135 tablet    Refill:  1   Patient Instructions  Stretches for knee as discussed. If these do not help your knee pain - return for recheck.   Diabetes still not controlled. Increase amaryl to 33m per day (one and one half tablets) . Watch out for low blood sugars. If this occurs - return to [address] and return to discuss other options.   You should receive a call or letter about your lab results within the next week to 10 days.   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  Return to the clinic or go to the nearest emergency room if any of your symptoms worsen or new symptoms occur.    I personally performed the  services described in this documentation, which was scribed in my presence. The recorded information has been reviewed and considered, and addended by me as needed.

## 2015-03-04 ENCOUNTER — Encounter: Payer: Self-pay | Admitting: Family Medicine

## 2015-03-04 LAB — MICROALBUMIN, URINE: MICROALB UR: 5.7 mg/dL — AB (ref <2.0)

## 2015-05-12 ENCOUNTER — Ambulatory Visit (INDEPENDENT_AMBULATORY_CARE_PROVIDER_SITE_OTHER): Payer: Managed Care, Other (non HMO) | Admitting: Family Medicine

## 2015-05-12 VITALS — BP 124/80 | HR 75 | Temp 97.9°F | Resp 16 | Ht 69.0 in | Wt 208.8 lb

## 2015-05-12 DIAGNOSIS — Z0000 Encounter for general adult medical examination without abnormal findings: Secondary | ICD-10-CM

## 2015-05-12 DIAGNOSIS — R7611 Nonspecific reaction to tuberculin skin test without active tuberculosis: Secondary | ICD-10-CM

## 2015-05-12 DIAGNOSIS — Z125 Encounter for screening for malignant neoplasm of prostate: Secondary | ICD-10-CM

## 2015-05-12 DIAGNOSIS — I10 Essential (primary) hypertension: Secondary | ICD-10-CM

## 2015-05-12 DIAGNOSIS — R809 Proteinuria, unspecified: Secondary | ICD-10-CM | POA: Diagnosis not present

## 2015-05-12 DIAGNOSIS — Z9289 Personal history of other medical treatment: Secondary | ICD-10-CM

## 2015-05-12 DIAGNOSIS — Z23 Encounter for immunization: Secondary | ICD-10-CM | POA: Diagnosis not present

## 2015-05-12 DIAGNOSIS — Z111 Encounter for screening for respiratory tuberculosis: Secondary | ICD-10-CM | POA: Diagnosis not present

## 2015-05-12 DIAGNOSIS — E119 Type 2 diabetes mellitus without complications: Secondary | ICD-10-CM

## 2015-05-12 DIAGNOSIS — E785 Hyperlipidemia, unspecified: Secondary | ICD-10-CM | POA: Diagnosis not present

## 2015-05-12 LAB — GLUCOSE, POCT (MANUAL RESULT ENTRY): POC Glucose: 108 mg/dL — AB (ref 70–99)

## 2015-05-12 LAB — POCT GLYCOSYLATED HEMOGLOBIN (HGB A1C): Hemoglobin A1C: 7

## 2015-05-12 LAB — PSA: PSA: 2.13 ng/mL

## 2015-05-12 MED ORDER — SITAGLIPTIN PHOSPHATE 100 MG PO TABS: 100.0000 mg | ORAL_TABLET | Freq: Every day | ORAL | Status: DC

## 2015-05-12 MED ORDER — GLIMEPIRIDE 2 MG PO TABS: 3.0000 mg | ORAL_TABLET | Freq: Every day | ORAL | Status: DC

## 2015-05-12 MED ORDER — SIMVASTATIN 20 MG PO TABS: ORAL_TABLET | ORAL | Status: DC

## 2015-05-12 MED ORDER — METFORMIN HCL 1000 MG PO TABS: 1000.0000 mg | ORAL_TABLET | Freq: Two times a day (BID) | ORAL | Status: DC

## 2015-05-12 MED ORDER — ESOMEPRAZOLE MAGNESIUM 40 MG PO CPDR: 40.0000 mg | DELAYED_RELEASE_CAPSULE | Freq: Every day | ORAL | Status: DC

## 2015-05-12 MED ORDER — LOSARTAN POTASSIUM 100 MG PO TABS: 100.0000 mg | ORAL_TABLET | Freq: Every day | ORAL | Status: DC

## 2015-05-12 NOTE — Progress Notes (Addendum)
Subjective:  This chart was scribed for [name], MD by Thea Alken, ED Scribe. This patient was seen in room 1 and the patient's care was started at 12:55 PM.  Patient ID: Wayne Perez, male    DOB: [birth year], 54 y.o.   MRN: [id number]  HPI   Chief Complaint  Patient presents with  . Annual Exam   HPI Comments: Wayne Perez is a 54 y.o. male who presents to the [facility] for a annual exam. She has hx of Dm type 11. last visit with me in June. He also has paperwork for [facility] for surgical procedure.   No specific concerns or questions.  Pt is fasting  Cancer screening  Colonoscopy- September 2015, Dr. Ardis Hughs. 1 benign polyp was removed, repeat colonoscopy in 10 years Prostate-  Lab Results  Component Value Date   PSA 2.04 03/04/2014   PSA 1.77 02/26/2013   Agrees to have PSA rechecked today.   Immunizations   Immunization History  Administered Date(s) Administered  . Hepatitis B 03/05/2013  . Hepatitis B, adult/adol-2 dose 04/04/2013, 09/03/2013  . Influenza Split 05/08/2011, 07/31/2012, 06/20/2013  . Influenza-Unspecified 06/03/2014  . Td 05/16/2009   Also requested TB and TDAP as well as flu vaccine will be given today.   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 States it was monitored with both CXR and blood test in the past. He was treated for tuberculosis with oral medication for 3 months. He denies trouble breathing, unexpected weight loss, SOB, CP.   Depression screening   Depression screen [facility] 2/9 05/12/2015 03/03/2015 03/04/2014  Decreased Interest 0 0 0  Down, Depressed, Hopeless 0 0 0  PHQ - 2 Score 0 0 0    Eye care- last seen by optho 10 months ago, next appointment is in 2 months. He wears glasses as needed, usually for driving. He denies trouble with eyes drifting. States there were no concerns with eyes regarding to DM.    Visual Acuity Screening   Right eye Left eye Both eyes    Without correction:     With correction: 20/13 20/13 20/13    Dentist- has seen a dentist within the past 6 months Exercise - pt exercises 3-4 days a week.  Diabetes  Lab Results  Component Value Date   HGBA1C 7.4 03/03/2015   Lab Results  Component Value Date   MICROALBUR 5.7* 03/03/2015   This was improved from 1 year prior. He does take losartan for microalbuminuria. We increased amaryl to 3 mg each day. Not quiet controlled at last visit.  Continued on same dose of januvia and metformin  Pt checks his blood sugar at home. Last reading was 3 days ago and was 119. He denies recent low readings.  Compliant with all medications.  Hypertension Stable last visit  Lab Results  Component Value Date   CREATININE 0.92 03/03/2015   Pt checks his BP at a local pharmacy. States it has been 124/86.  Hyperlipidemia  Lab Results  Component Value Date   CHOL 135 03/03/2015   HDL 53 03/03/2015   LDLCALC 67 03/03/2015   TRIG 74 03/03/2015   CHOLHDL 2.5 03/03/2015   Continued on zocar 20 mg qd. Does takes aspirin 81 mg.  GERD Takes Nexium everyday. He has tried to gone without medication but had heartburn. Has not tried taking Nexium every other day.    Patient Active Problem List   Diagnosis  Date Noted  . Diabetes 07/31/2012  . HTN (hypertension) 07/31/2012   Past Medical History  Diagnosis Date  . Diabetes mellitus without complication   . Hypertension   . Hyperlipidemia   . GERD (gastroesophageal reflux disease)    Past Surgical History  Procedure Laterality Date  . Colonoscopy     Allergies  Allergen Reactions  . Ace Inhibitors Itching   Prior to Admission medications   Medication Sig Start Date End Date Taking? Authorizing Provider  aspirin 81 MG tablet Take 81 mg by mouth daily.    Historical Provider, MD  Blood Glucose Monitoring Suppl KIT One Touch Ultra or may substitute for what insurance covers.  May check once per day 09/09/14   [name], MD   esomeprazole (NEXIUM) 40 MG capsule take 1 capsule by mouth once daily 02/18/15   [name], MD  glimepiride (AMARYL) 2 MG tablet Take 1.5 tablets (3 mg total) by mouth daily before breakfast. 03/03/15   [name], MD  losartan (COZAAR) 100 MG tablet Take 1 tablet (100 mg total) by mouth daily. 03/03/15   [name], MD  metFORMIN (GLUCOPHAGE) 1000 MG tablet Take 1 tablet (1,000 mg total) by mouth 2 (two) times daily with a meal. 03/03/15   [name], MD  simvastatin (ZOCOR) 20 MG tablet TAKE 1 TABLET BY MOUTH EVERY EVENING. 03/03/15   [name], MD  sitaGLIPtin (JANUVIA) 100 MG tablet Take 1 tablet (100 mg total) by mouth daily. 03/03/15   [name], MD   Social History   Social History  . Marital Status: Married    Spouse Name: N/A  . Number of Children: 3  . Years of Education: N/A   Occupational History  . Cook    Social History Main Topics  . Smoking status: Never Smoker   . Smokeless tobacco: Never Used  . Alcohol Use: No  . Drug Use: No  . Sexual Activity: Yes   Other Topics Concern  . Not on file   Social History Narrative   Married. Education: [employer]. Exercise: Walks twice a week for 1-2 hours.   Review of Systems  Constitutional: Negative for fatigue and unexpected weight change.  Eyes: Negative for visual disturbance.  Respiratory: Negative for cough, chest tightness and shortness of breath.   Cardiovascular: Negative for chest pain, palpitations and leg swelling.  Gastrointestinal: Negative for abdominal pain and blood in stool.  Neurological: Negative for dizziness, light-headedness and headaches.   13 point ROS reviewed. Negative other than what's listed above.  Objective:   Physical Exam  Constitutional: He is oriented to person, place, and time. He appears well-developed and well-nourished.  HENT:  Head: Normocephalic and atraumatic.  Eyes: EOM are normal. Pupils are equal, round, and reactive to light.  Neck: No JVD  present. Carotid bruit is not present.  Cardiovascular: Normal rate, regular rhythm and normal heart sounds.  Exam reveals no gallop and no friction rub.   No murmur heard. Pulmonary/Chest: Effort normal and breath sounds normal. He has no wheezes. He has no rales.  Musculoskeletal: Normal range of motion. He exhibits no edema.  Neurological: He is alert and oriented to person, place, and time.  Skin: Skin is warm and dry.  Psychiatric: He has a normal mood and affect.  Vitals reviewed.  Filed Vitals:   05/12/15 1256  BP: 124/80  Pulse: 75  Temp: 97.9 F (36.6 C)  TempSrc: Oral  Resp: 16  Height: 5' 9"  (  1.753 m)  Weight: 208 lb 12.8 oz (94.711 kg)  SpO2: 98%    Results for orders placed or performed in visit on 05/12/15  POCT glycosylated hemoglobin (Hb A1C)  Result Value Ref Range   Hemoglobin A1C 7.0   POCT glucose (manual entry)  Result Value Ref Range   POC Glucose 108 (A) 70 - 99 mg/dl    Assessment & Plan:   Wayne Perez is a 54 y.o. male Annual physical exam  -anticipatory guidance as below in AVS, screening labs above. Health maintenance items as above in HPI discussed/recommended as applicable.   Type 2 diabetes mellitus without complication - Plan: POCT glycosylated hemoglobin (Hb A1C), POCT glucose (manual entry), glimepiride (AMARYL) 2 MG tablet, metFORMIN (GLUCOPHAGE) 1000 MG tablet, sitaGLIPtin (JANUVIA) 100 MG tablet  -Improved control. No change in medicines for now. Continue to watch diet, as may be old to get his A1c lower than 7 with diet changes along with current regimen.  Essential hypertension - Plan: losartan (COZAAR) 100 MG tablet  -Stable. Meds refilled.  Hyperlipidemia - Plan: simvastatin (ZOCOR) 20 MG tablet  -Prior reading was stable few months ago. No change in meds.  Microalbuminuria  -Continue ARB for nephroprotection.  Need for Tdap vaccination - Plan: Tdap vaccine greater than or equal to 7yo IM given  Need for prophylactic  vaccination and inoculation against influenza - Plan: Flu Vaccine QUAD 36+ mos IM given  Screening for prostate cancer - Plan: PSA  -We discussed pros and cons of prostate cancer screening, and after this discussion, he chose to have screening done. PSA obtained, and no concerning findings on DRE.   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  Forms completed for school. If MMR or varicella titer needed, or other blood work needed, advised him to call me so I can order these.  Recheck 3 months  Meds ordered this encounter  Medications  . esomeprazole (NEXIUM) 40 MG capsule    Sig: Take 1 capsule (40 mg total) by mouth daily.    Dispense:  90 capsule    Refill:  1  . glimepiride (AMARYL) 2 MG tablet    Sig: Take 1.5 tablets (3 mg total) by mouth daily before breakfast.    Dispense:  135 tablet    Refill:  1  . losartan (COZAAR) 100 MG tablet    Sig: Take 1 tablet (100 mg total) by mouth daily.    Dispense:  90 tablet    Refill:  1  . metFORMIN (GLUCOPHAGE) 1000 MG tablet    Sig: Take 1 tablet (1,000 mg total) by mouth 2 (two) times daily with a meal.    Dispense:  180 tablet    Refill:  1  . simvastatin (ZOCOR) 20 MG tablet    Sig: TAKE 1 TABLET BY MOUTH EVERY EVENING.    Dispense:  90 tablet    Refill:  1  . sitaGLIPtin (JANUVIA) 100 MG tablet    Sig: Take 1 tablet (100 mg total) by mouth daily.    Dispense:  90 tablet    Refill:  1   Patient Instructions  Try to skip to every other day on Nexium to see if this still controls heartburn. Also avoid foods known to trigger heartburn as below. Return to the clinic or go to the nearest emergency room if any of your symptoms worsen or  new symptoms occur.  If you need a test to determine if you are immune to MMR and varicella, I can order that for you at our office.   I did check a quantiferon  gold for tb screening today, as well as prostate test. You should receive a call or letter about your lab results within the next week to 10 days.   Keeping you healthy  Get these tests  Blood pressure- Have your blood pressure checked once a year by your healthcare provider.  Normal blood pressure is 120/80  Weight- Have your body mass index (BMI) calculated to screen for obesity.  BMI is a measure of body fat based on height and weight. You can also calculate your own BMI at View[employer].si.  Cholesterol- Have your cholesterol checked every year.  Diabetes- Have your blood sugar checked regularly if you have high blood pressure, high cholesterol, have a family history of diabetes or if you are overweight.  Screening for Colon Cancer- Colonoscopy starting at age 14.  Screening may begin sooner depending on your family history and other health conditions. Follow up colonoscopy as directed by your Gastroenterologist.  Screening for Prostate Cancer- Both blood work (PSA) and a rectal exam help screen for Prostate Cancer.  Screening begins at age 29 with African-American men and at age 10 with Caucasian men.  Screening may begin sooner depending on your family history.  Take these medicines  Aspirin- One aspirin daily can help prevent Heart disease and Stroke.  Flu shot- Every fall.  Tetanus- Every 10 years.  Zostavax- Once after the age of 80 to prevent Shingles.  Pneumonia shot- Once after the age of 23; if you are younger than 13, ask your healthcare provider if you need a Pneumonia shot.  Take these steps  Don't smoke- If you do smoke, talk to your doctor about quitting.  For tips on how to quit, go to www.smokefree.gov or call 1-800-QUIT-NOW.  Be physically active- Exercise 5 days a week for at least 30 minutes.  If you are not already physically active start slow and gradually work up to 30 minutes of moderate physical activity.  Examples of moderate activity include  walking briskly, mowing the yard, dancing, swimming, bicycling, etc.  Eat a healthy diet- Eat a variety of healthy food such as fruits, vegetables, low fat milk, low fat cheese, yogurt, lean meant, poultry, fish, beans, tofu, etc. For more information go to www.thenutritionsource.org  Drink alcohol in moderation- Limit alcohol intake to less than two drinks a day. Never drink and drive.  Dentist- Brush and floss twice daily; visit your dentist twice a year.  Depression- Your emotional health is as important as your physical health. If you're feeling down, or losing interest in things you would normally enjoy please talk to your healthcare provider.  Eye exam- Visit your eye doctor every year.  Safe sex- If you may be exposed to a sexually transmitted infection, use a condom.  Seat belts- Seat belts can save your life; always wear one.  Smoke/Carbon Monoxide detectors- These detectors need to be installed on the appropriate level of your home.  Replace batteries at least once a year.  Skin cancer- When out in the sun, cover up and use sunscreen 15 SPF or higher.  Violence- If anyone is threatening you, please tell your healthcare provider.  Living Will/ Health care power of attorney- Speak with your healthcare provider and family.  Food Choices for Gastroesophageal Reflux Disease When you have gastroesophageal reflux disease (  GERD), the foods you eat and your eating habits are very important. Choosing the right foods can help ease the discomfort of GERD. WHAT GENERAL GUIDELINES DO I NEED TO FOLLOW?  Choose fruits, vegetables, whole grains, low-fat dairy products, and low-fat meat, fish, and poultry.  Limit fats such as oils, salad dressings, butter, nuts, and avocado.  Keep a food diary to identify foods that cause symptoms.  Avoid foods that cause reflux. These may be different for different people.  Eat frequent small meals instead of three large meals each day.  Eat your meals  slowly, in a relaxed setting.  Limit fried foods.  Cook foods using methods other than frying.  Avoid drinking alcohol.  Avoid drinking large amounts of liquids with your meals.  Avoid bending over or lying down until 2-3 hours after eating. WHAT FOODS ARE NOT RECOMMENDED? The following are some foods and drinks that may worsen your symptoms: Vegetables Tomatoes. Tomato juice. Tomato and spaghetti sauce. Chili peppers. Onion and garlic. Horseradish. Fruits Oranges, grapefruit, and lemon (fruit and juice). Meats High-fat meats, fish, and poultry. This includes hot dogs, ribs, ham, sausage, salami, and bacon. Dairy Whole milk and chocolate milk. Sour cream. Cream. Butter. Ice cream. Cream cheese.  Beverages Coffee and tea, with or without caffeine. Carbonated beverages or energy drinks. Condiments Hot sauce. Barbecue sauce.  Sweets/Desserts Chocolate and cocoa. Donuts. Peppermint and spearmint. Fats and Oils High-fat foods, including [place] fries and potato chips. Other Vinegar. Strong spices, such as black pepper, white pepper, red pepper, cayenne, curry powder, cloves, ginger, and chili powder. The items listed above may not be a complete list of foods and beverages to avoid. Contact your dietitian for more information. Document Released: 08/23/2005 Document Revised: 08/28/2013 Document Reviewed: 06/27/2013 [facility] Patient Information 2015 [place], [place]. This information is not intended to replace advice given to you by your health care provider. Make sure you discuss any questions you have with your health care provider.      I personally performed the services described in this documentation, which was scribed in my presence. The recorded information has been reviewed and considered, and addended by me as needed.

## 2015-05-12 NOTE — Patient Instructions (Addendum)
Try to skip to every other day on Nexium to see if this still controls heartburn. Also avoid foods known to trigger heartburn as below. Return to the clinic or go to the nearest emergency room if any of your symptoms worsen or new symptoms occur.  If you need a test to determine if you are immune to MMR and varicella, I can order that for you at our office.   I did check a quantiferon gold for tb screening today, as well as prostate test. You should receive a call or letter about your lab results within the next week to 10 days.   Keeping you healthy  Get these tests  Blood pressure- Have your blood pressure checked once a year by your healthcare provider.  Normal blood pressure is 120/80  Weight- Have your body mass index (BMI) calculated to screen for obesity.  BMI is a measure of body fat based on height and weight. You can also calculate your own BMI at View[employer].  Cholesterol- Have your cholesterol checked every year.  Diabetes- Have your blood sugar checked regularly if you have high blood pressure, high cholesterol, have a family history of diabetes or if you are overweight.  Screening for Colon Cancer- Colonoscopy starting at age 65.  Screening may begin sooner depending on your family history and other health conditions. Follow up colonoscopy as directed by your Gastroenterologist.  Screening for Prostate Cancer- Both blood work (PSA) and a rectal exam help screen for Prostate Cancer.  Screening begins at age 100 with African-American men and at age 59 with Caucasian men.  Screening may begin sooner depending on your family history.  Take these medicines  Aspirin- One aspirin daily can help prevent Heart disease and Stroke.  Flu shot- Every fall.  Tetanus- Every 10 years.  Zostavax- Once after the age of 25 to prevent Shingles.  Pneumonia shot- Once after the age of 64; if you are younger than 12, ask your healthcare provider if you need a Pneumonia  shot.  Take these steps  Don't smoke- If you do smoke, talk to your doctor about quitting.  For tips on how to quit, go to www.smokefree.gov or call 1-800-QUIT-NOW.  Be physically active- Exercise 5 days a week for at least 30 minutes.  If you are not already physically active start slow and gradually work up to 30 minutes of moderate physical activity.  Examples of moderate activity include walking briskly, mowing the yard, dancing, swimming, bicycling, etc.  Eat a healthy diet- Eat a variety of healthy food such as fruits, vegetables, low fat milk, low fat cheese, yogurt, lean meant, poultry, fish, beans, tofu, etc. For more information go to www.thenutritionsource.org  Drink alcohol in moderation- Limit alcohol intake to less than two drinks a day. Never drink and drive.  Dentist- Brush and floss twice daily; visit your dentist twice a year.  Depression- Your emotional health is as important as your physical health. If you're feeling down, or losing interest in things you would normally enjoy please talk to your healthcare provider.  Eye exam- Visit your eye doctor every year.  Safe sex- If you may be exposed to a sexually transmitted infection, use a condom.  Seat belts- Seat belts can save your life; always wear one.  Smoke/Carbon Monoxide detectors- These detectors need to be installed on the appropriate level of your home.  Replace batteries at least once a year.  Skin cancer- When out in the sun, cover up and use sunscreen 15 SPF  or higher.  Violence- If anyone is threatening you, please tell your healthcare provider.  Living Will/ Health care power of attorney- Speak with your healthcare provider and family.  Food Choices for Gastroesophageal Reflux Disease When you have gastroesophageal reflux disease (GERD), the foods you eat and your eating habits are very important. Choosing the right foods can help ease the discomfort of GERD. WHAT GENERAL GUIDELINES DO I NEED TO  FOLLOW?  Choose fruits, vegetables, whole grains, low-fat dairy products, and low-fat meat, fish, and poultry.  Limit fats such as oils, salad dressings, butter, nuts, and avocado.  Keep a food diary to identify foods that cause symptoms.  Avoid foods that cause reflux. These may be different for different people.  Eat frequent small meals instead of three large meals each day.  Eat your meals slowly, in a relaxed setting.  Limit fried foods.  Cook foods using methods other than frying.  Avoid drinking alcohol.  Avoid drinking large amounts of liquids with your meals.  Avoid bending over or lying down until 2-3 hours after eating. WHAT FOODS ARE NOT RECOMMENDED? The following are some foods and drinks that may worsen your symptoms: Vegetables Tomatoes. Tomato juice. Tomato and spaghetti sauce. Chili peppers. Onion and garlic. Horseradish. Fruits Oranges, grapefruit, and lemon (fruit and juice). Meats High-fat meats, fish, and poultry. This includes hot dogs, ribs, ham, sausage, salami, and bacon. Dairy Whole milk and chocolate milk. Sour cream. Cream. Butter. Ice cream. Cream cheese.  Beverages Coffee and tea, with or without caffeine. Carbonated beverages or energy drinks. Condiments Hot sauce. Barbecue sauce.  Sweets/Desserts Chocolate and cocoa. Donuts. Peppermint and spearmint. Fats and Oils High-fat foods, including [place] fries and potato chips. Other Vinegar. Strong spices, such as black pepper, white pepper, red pepper, cayenne, curry powder, cloves, ginger, and chili powder. The items listed above may not be a complete list of foods and beverages to avoid. Contact your dietitian for more information. Document Released: 08/23/2005 Document Revised: 08/28/2013 Document Reviewed: 06/27/2013 [facility] Patient Information 2015 [place], [place]. This information is not intended to replace advice given to you by your health care provider. Make sure you discuss any  questions you have with your health care provider.

## 2015-05-15 LAB — QUANTIFERON TB GOLD ASSAY (BLOOD)
Interferon Gamma Release Assay: POSITIVE — AB
QUANTIFERON NIL VALUE: 0.16 [IU]/mL
Quantiferon Tb Ag Minus Nil Value: 0.47 IU/mL
TB Ag value: 0.63 IU/mL

## 2015-05-22 ENCOUNTER — Encounter: Payer: Self-pay | Admitting: *Deleted

## 2015-05-27 ENCOUNTER — Ambulatory Visit (INDEPENDENT_AMBULATORY_CARE_PROVIDER_SITE_OTHER): Payer: Managed Care, Other (non HMO)

## 2015-05-27 ENCOUNTER — Encounter: Payer: Self-pay | Admitting: Family Medicine

## 2015-05-27 ENCOUNTER — Ambulatory Visit (INDEPENDENT_AMBULATORY_CARE_PROVIDER_SITE_OTHER): Payer: Managed Care, Other (non HMO) | Admitting: Family Medicine

## 2015-05-27 VITALS — BP 124/72 | HR 81 | Temp 97.8°F | Resp 18 | Ht 70.0 in | Wt 210.0 lb

## 2015-05-27 DIAGNOSIS — R7612 Nonspecific reaction to cell mediated immunity measurement of gamma interferon antigen response without active tuberculosis: Secondary | ICD-10-CM | POA: Diagnosis not present

## 2015-05-27 NOTE — Progress Notes (Signed)
Subjective:    Patient ID: Wayne Perez, male    DOB: [birth year], 54 y.o.   MRN: [id number]  HPI Wayne Perez is a 54 y.o. male  Here to discuss lab results.  Positive quantiferon gold. Hx of +PPD with treatment in 1994. Negative CXR and no symptoms since that time, including today - see lab notes.  Will check CXR today.     Patient Active Problem List   Diagnosis Date Noted  . Diabetes 07/31/2012  . HTN (hypertension) 07/31/2012   Past Medical History  Diagnosis Date  . Diabetes mellitus without complication   . Hypertension   . Hyperlipidemia   . GERD (gastroesophageal reflux disease)    Past Surgical History  Procedure Laterality Date  . Colonoscopy     Allergies  Allergen Reactions  . Ace Inhibitors Itching   Prior to Admission medications   Medication Sig Start Date End Date Taking? Authorizing Provider  aspirin 81 MG tablet Take 81 mg by mouth daily.    Historical Provider, MD  Blood Glucose Monitoring Suppl KIT One Touch Ultra or may substitute for what insurance covers.  May check once per day 09/09/14   [name], MD  esomeprazole (NEXIUM) 40 MG capsule Take 1 capsule (40 mg total) by mouth daily. 05/12/15   [name], MD  glimepiride (AMARYL) 2 MG tablet Take 1.5 tablets (3 mg total) by mouth daily before breakfast. 05/12/15   [name], MD  losartan (COZAAR) 100 MG tablet Take 1 tablet (100 mg total) by mouth daily. 05/12/15   [name], MD  metFORMIN (GLUCOPHAGE) 1000 MG tablet Take 1 tablet (1,000 mg total) by mouth 2 (two) times daily with a meal. 05/12/15   [name], MD  simvastatin (ZOCOR) 20 MG tablet TAKE 1 TABLET BY MOUTH EVERY EVENING. 05/12/15   [name], MD  sitaGLIPtin (JANUVIA) 100 MG tablet Take 1 tablet (100 mg total) by mouth daily. 05/12/15   [name], MD   Social History   Social History  . Marital Status: Married    Spouse Name: N/A  . Number of Children: 3  . Years of Education: N/A   Occupational  History  . Cook    Social History Main Topics  . Smoking status: Never Smoker   . Smokeless tobacco: Never Used  . Alcohol Use: No  . Drug Use: No  . Sexual Activity: Yes   Other Topics Concern  . Not on file   Social History Narrative   Married. Education: [employer]. Exercise: Walks twice a week for 1-2 hours.       Review of Systems  Constitutional: Negative for fever, chills, fatigue and unexpected weight change.  Respiratory: Negative for cough and shortness of breath.        Objective:   Physical Exam  Constitutional: He is oriented to person, place, and time. He appears well-developed and well-nourished. No distress.  HENT:  Head: Normocephalic and atraumatic.  Cardiovascular: Normal rate, regular rhythm, normal heart sounds and intact distal pulses.   Pulmonary/Chest: Effort normal and breath sounds normal. No respiratory distress.  Neurological: He is alert and oriented to person, place, and time.  Skin: Skin is warm and dry.  Psychiatric: He has a normal mood and affect. His behavior is normal. Thought content normal.  Vitals reviewed.  [facility] reading (PRIMARY) by  Dr. Carlota Raspberry: CXR: 1 view - no acute findings.      Assessment & Plan:  Wayne Perez  is a 54 y.o. male Positive QuantiFERON-TB Gold test - Plan: DG Chest 1 View  -Asymptomatic and no concerning findings on chest x-ray. Did recommend he obtain records of at least 6 months of INH treatment from the health department from when he was treated in the 90s. However his form for school only requires a chest x-ray and no active symptoms for completion. Form completed, see scanned copy, RTC precautions.  No orders of the defined types were placed in this encounter.   There are no Patient Instructions on file for this visit.

## 2015-06-16 ENCOUNTER — Telehealth: Payer: Self-pay | Admitting: Family Medicine

## 2015-06-16 NOTE — Telephone Encounter (Signed)
Patient came to walk in center and aggressively demanded to get a copy of his flu shot and a copy of his x-rays. Explained to patient that I will have to put in a message to our medical records staff and our x-ray staff. This request can take a few business days to process. Patient completed a release of information. He states that he needs this information for school. Please call patient when his stuff is ready for pick up.   [phone]

## 2015-06-17 NOTE — Telephone Encounter (Signed)
At the patient's request, I made a copy of his CXR on CD and a copy of his immunization report. I placed in the pick up drawer. I also LMOM to let him know that his CD and Immunization record is ready to pick up.

## 2015-07-14 ENCOUNTER — Encounter: Payer: Managed Care, Other (non HMO) | Admitting: Family Medicine

## 2015-08-07 ENCOUNTER — Encounter: Payer: Self-pay | Admitting: *Deleted

## 2015-08-07 DIAGNOSIS — N5201 Erectile dysfunction due to arterial insufficiency: Secondary | ICD-10-CM

## 2015-08-07 DIAGNOSIS — I861 Scrotal varices: Secondary | ICD-10-CM | POA: Insufficient documentation

## 2015-10-28 ENCOUNTER — Ambulatory Visit (INDEPENDENT_AMBULATORY_CARE_PROVIDER_SITE_OTHER): Payer: Managed Care, Other (non HMO) | Admitting: Family Medicine

## 2015-10-28 ENCOUNTER — Telehealth: Payer: Self-pay

## 2015-10-28 VITALS — BP 152/103 | HR 75 | Temp 97.3°F | Resp 17 | Ht 70.0 in | Wt 210.0 lb

## 2015-10-28 DIAGNOSIS — E119 Type 2 diabetes mellitus without complications: Secondary | ICD-10-CM | POA: Diagnosis not present

## 2015-10-28 DIAGNOSIS — E1121 Type 2 diabetes mellitus with diabetic nephropathy: Secondary | ICD-10-CM | POA: Diagnosis not present

## 2015-10-28 DIAGNOSIS — E785 Hyperlipidemia, unspecified: Secondary | ICD-10-CM | POA: Diagnosis not present

## 2015-10-28 DIAGNOSIS — K219 Gastro-esophageal reflux disease without esophagitis: Secondary | ICD-10-CM | POA: Diagnosis not present

## 2015-10-28 DIAGNOSIS — I10 Essential (primary) hypertension: Secondary | ICD-10-CM | POA: Diagnosis not present

## 2015-10-28 LAB — POCT GLYCOSYLATED HEMOGLOBIN (HGB A1C): Hemoglobin A1C: 7.6

## 2015-10-28 LAB — GLUCOSE, POCT (MANUAL RESULT ENTRY): POC Glucose: 104 mg/dl — AB (ref 70–99)

## 2015-10-28 MED ORDER — SITAGLIPTIN PHOSPHATE 100 MG PO TABS: 100.0000 mg | ORAL_TABLET | Freq: Every day | ORAL | Status: DC

## 2015-10-28 MED ORDER — GLIMEPIRIDE 2 MG PO TABS: 3.0000 mg | ORAL_TABLET | Freq: Every day | ORAL | Status: DC

## 2015-10-28 MED ORDER — LOSARTAN POTASSIUM 100 MG PO TABS: 100.0000 mg | ORAL_TABLET | Freq: Every day | ORAL | Status: DC

## 2015-10-28 MED ORDER — METFORMIN HCL 1000 MG PO TABS: 1000.0000 mg | ORAL_TABLET | Freq: Two times a day (BID) | ORAL | Status: DC

## 2015-10-28 MED ORDER — ESOMEPRAZOLE MAGNESIUM 40 MG PO CPDR: 40.0000 mg | DELAYED_RELEASE_CAPSULE | Freq: Every day | ORAL | Status: DC

## 2015-10-28 MED ORDER — SIMVASTATIN 20 MG PO TABS: ORAL_TABLET | ORAL | Status: DC

## 2015-10-28 NOTE — Patient Instructions (Addendum)
Keep a record of your blood pressures outside of the office and if over 140/90 - call me and I will send in a new medicine. You should receive a call or letter about your lab results within the next week to 10 days.    I will call you about the lab results from today and about change in pigment on penis.  I will have your paperwork ready in the next 1 week.   Follow up in 3 months for diabetes.

## 2015-10-28 NOTE — Telephone Encounter (Signed)
Wayne Perez - Pt dropped off forms to be filled out.  I have left them in your box.  Please call patient at [phone] when ready to pick up

## 2015-10-28 NOTE — Progress Notes (Signed)
Subjective:  By signing my name below, I, Rawaa Al Rifaie, attest that this documentation has been prepared under the direction and in the presence of [name], MD.  Leandra Kern, Medical Scribe. 10/28/2015.  5:11 PM.   Patient ID: Wayne Perez, male    DOB: Jul 28, 1961, 55 y.o.   MRN: [id number]  Chief Complaint  Patient presents with  . Follow-up    forms filled out   . Medication Refill    nexium,amaryl,cozaar, metformin, zocor januvia     HPI HPI Comments: Wayne Perez is a 54 y.o. male who presents to [facility] for a follow up.  Pt has forms regarding his blood pressure and diabetes for his insurance company that he needs to be filled out.  Pt last had a physical exam in 05/2015. Pt is requesting medications refills for nexium, amaryl, cozaar, metformin, zocor, and [place].   GERD: Pt takes Nexium 40 mg.  Today, pt is compliant with taking this medication once everyday. He still presents with some breakthrough symptoms.   Diabetes: Lab Results  Component Value Date   HGBA1C 7.0 05/12/2015  Pt is on metformin, and amaryl, and [place].  Lab Results  Component Value Date   MICROALBUR 5.7* 03/03/2015  Pt does take Losartan 100 mg qd.  Today, pt notes that his diabetes is controlled. He checks his blood sugar regularly, and usually runs in the 120's range. He is compliant with taking his medications, and reports experiencing no side effects. Pt denies experiencing any hypoglycemic symptoms.   HLD: Lab Results  Component Value Date   CHOL 135 03/03/2015   HDL 53 03/03/2015   LDLCALC 67 03/03/2015   TRIG 74 03/03/2015   CHOLHDL 2.5 03/03/2015   Lab Results  Component Value Date   ALT 39 03/03/2015   AST 33 03/03/2015   ALKPHOS 57 03/03/2015   BILITOT 0.7 03/03/2015  Pt takes Zocor 20 mg qd. Pt is compliant with taking this medication. He denies experiencing myalgia with this medication. Pt is not currently in a fasting state, last oral  intake was 4 hours ago.  HTN: Pt is on losartan as above. He reports that his blood pressure is elevated. He indicates that he does not check his blood pressure regularly, but he is compliant with taking his medication without missing dosages. Pt reports that his blood pressure is controlled and usually run within normal limits. He denies experiencing any symptoms.  Penile discoloration: Pt reports having change of color on the head of the penis, first noticed 2 weeks ago.He denies itching, discharge, or tenderness of the area, abdominal pain, nausea or vomiting.    Patient Active Problem List   Diagnosis Date Noted  . Erectile dysfunction due to arterial insufficiency 08/07/2015  . Varicocele 08/07/2015  . Diabetes ([place]) 07/31/2012  . HTN (hypertension) 07/31/2012   Past Medical History  Diagnosis Date  . Diabetes mellitus without complication ([place])   . Hypertension   . Hyperlipidemia   . GERD (gastroesophageal reflux disease)    Past Surgical History  Procedure Laterality Date  . Colonoscopy     Allergies  Allergen Reactions  . Ace Inhibitors Itching   Prior to Admission medications   Medication Sig Start Date End Date Taking? Authorizing Provider  aspirin 81 MG tablet Take 81 mg by mouth daily.   Yes Historical Provider, MD  Blood Glucose Monitoring Suppl KIT One Touch Ultra or may substitute for what insurance covers.  May check once per  day 09/09/14  Yes [name], MD  esomeprazole (NEXIUM) 40 MG capsule Take 1 capsule (40 mg total) by mouth daily. 05/12/15  Yes [name], MD  glimepiride (AMARYL) 2 MG tablet Take 1.5 tablets (3 mg total) by mouth daily before breakfast. 05/12/15  Yes [name], MD  losartan (COZAAR) 100 MG tablet Take 1 tablet (100 mg total) by mouth daily. 05/12/15  Yes [name], MD  metFORMIN (GLUCOPHAGE) 1000 MG tablet Take 1 tablet (1,000 mg total) by mouth 2 (two) times daily with a meal. 05/12/15  Yes [name], MD    simvastatin (ZOCOR) 20 MG tablet TAKE 1 TABLET BY MOUTH EVERY EVENING. 05/12/15  Yes [name], MD  sitaGLIPtin (JANUVIA) 100 MG tablet Take 1 tablet (100 mg total) by mouth daily. 05/12/15  Yes [name], MD   Social History   Social History  . Marital Status: Married    Spouse Name: N/A  . Number of Children: 3  . Years of Education: N/A   Occupational History  . Cook    Social History Main Topics  . Smoking status: Never Smoker   . Smokeless tobacco: Never Used  . Alcohol Use: No  . Drug Use: No  . Sexual Activity: Yes   Other Topics Concern  . Not on file   Social History Narrative   Married. Education: [employer]. Exercise: Walks twice a week for 1-2 hours.    Review of Systems  Constitutional: Negative for diaphoresis, fatigue and unexpected weight change.  Eyes: Negative for visual disturbance.  Respiratory: Negative for cough, chest tightness and shortness of breath.   Cardiovascular: Negative for chest pain, palpitations and leg swelling.  Gastrointestinal: Negative for nausea, vomiting, abdominal pain and blood in stool.  Genitourinary: Negative for discharge and penile pain.       Penile head discoloration.  Musculoskeletal: Negative for myalgias.  Neurological: Negative for dizziness, light-headedness and headaches.  Psychiatric/Behavioral: Negative for confusion.      Objective:   Physical Exam  Constitutional: He is oriented to person, place, and time. He appears well-developed and well-nourished. No distress.  HENT:  Head: Normocephalic and atraumatic.  Eyes: EOM are normal. Pupils are equal, round, and reactive to light.  Neck: Neck supple. No JVD present. Carotid bruit is not present.  Cardiovascular: Normal rate, regular rhythm and normal heart sounds.   No murmur heard. Pulmonary/Chest: Effort normal and breath sounds normal. He has no rales.  Genitourinary:  hypopigmentation over the left glands of the penis.  Testicles are non tender.  No penile discharge. Pt is uncircumcised.  Musculoskeletal: He exhibits no edema.  Neurological: He is alert and oriented to person, place, and time. No cranial nerve deficit.  Skin: Skin is warm and dry.  Psychiatric: He has a normal mood and affect. His behavior is normal.  Nursing note and vitals reviewed.   Filed Vitals:   10/28/15 1606  BP: 136/94  Pulse: 75  Temp: 97.3 F (36.3 C)  TempSrc: Oral  Resp: 17  Height: _0  (1.778 m)  Weight: 210 lb (95.255 kg)  SpO2: 99%   Lab Results  Component Value Date   HGBA1C 7.6 10/28/2015       Assessment & Plan:   Vin Yonke is a 55 y.o. male Essential hypertension - Plan: Basic metabolic panel, losartan (COZAAR) 100 MG tablet  -Borderline elevated in office, home readings better. We'll have him check his home readings, and if over 140/90, call  me and we will add a second medication.   Controlled type 2 diabetes mellitus with diabetic nephropathy, without long-term current use of insulin ([place]) - Plan: POCT glucose (manual entry), POCT glycosylated hemoglobin (Hb A1C)  - A1c reading higher than in past, decided to remain on same medications, but watch diet closely and exercise. If still over 7 in 3 months, adjust medication.  Gastroesophageal reflux disease, esophagitis presence not specified - Plan: esomeprazole (NEXIUM) 40 MG capsule  - Again recommended avoidance of triggers for reflux, and possible side effects and risks of long-term use of PPIs were discussed. Can also try over-the-counter Zantac or Pepcid first if only mild symptoms.  Nexium refilled if needed.   Hyperlipidemia - Plan: simvastatin (ZOCOR) 20 MG tablet  -Refilled simvastatin. fasting labs next visit.  Meds ordered this encounter  Medications  . glimepiride (AMARYL) 2 MG tablet    Sig: Take 1.5 tablets (3 mg total) by mouth daily before breakfast.    Dispense:  135 tablet    Refill:  1  . esomeprazole (NEXIUM) 40 MG capsule    Sig: Take 1 capsule (40  mg total) by mouth daily.    Dispense:  90 capsule    Refill:  1  . losartan (COZAAR) 100 MG tablet    Sig: Take 1 tablet (100 mg total) by mouth daily.    Dispense:  90 tablet    Refill:  1  . metFORMIN (GLUCOPHAGE) 1000 MG tablet    Sig: Take 1 tablet (1,000 mg total) by mouth 2 (two) times daily with a meal.    Dispense:  180 tablet    Refill:  1  . simvastatin (ZOCOR) 20 MG tablet    Sig: TAKE 1 TABLET BY MOUTH EVERY EVENING.    Dispense:  90 tablet    Refill:  1  . sitaGLIPtin (JANUVIA) 100 MG tablet    Sig: Take 1 tablet (100 mg total) by mouth daily.    Dispense:  90 tablet    Refill:  1   Patient Instructions  Keep a record of your blood pressures outside of the office and if over 140/90 - call me and I will send in a new medicine. You should receive a call or letter about your lab results within the next week to 10 days.    I will call you about the lab results from today and about change in pigment on penis.  I will have your paperwork ready in the next 1 week.   Follow up in 3 months for diabetes.     I personally performed the services described in this documentation, which was scribed in my presence. The recorded information has been reviewed and considered, and addended by me as needed.

## 2015-10-29 LAB — BASIC METABOLIC PANEL
BUN: 12 mg/dL (ref 7–25)
CO2: 26 mmol/L (ref 20–31)
CREATININE: 0.96 mg/dL (ref 0.70–1.33)
Calcium: 9 mg/dL (ref 8.6–10.3)
Chloride: 103 mmol/L (ref 98–110)
GLUCOSE: 94 mg/dL (ref 65–99)
Potassium: 4 mmol/L (ref 3.5–5.3)
Sodium: 138 mmol/L (ref 135–146)

## 2015-10-30 NOTE — Telephone Encounter (Signed)
Dr. Carlota Raspberry  Please see previous message

## 2015-11-03 ENCOUNTER — Telehealth: Payer: Self-pay | Admitting: Family Medicine

## 2015-11-03 ENCOUNTER — Ambulatory Visit (INDEPENDENT_AMBULATORY_CARE_PROVIDER_SITE_OTHER): Payer: Managed Care, Other (non HMO) | Admitting: Family Medicine

## 2015-11-03 ENCOUNTER — Ambulatory Visit (INDEPENDENT_AMBULATORY_CARE_PROVIDER_SITE_OTHER): Payer: Managed Care, Other (non HMO)

## 2015-11-03 VITALS — BP 158/96 | HR 75 | Temp 98.2°F | Resp 18 | Ht 70.25 in | Wt 206.4 lb

## 2015-11-03 DIAGNOSIS — R7612 Nonspecific reaction to cell mediated immunity measurement of gamma interferon antigen response without active tuberculosis: Secondary | ICD-10-CM

## 2015-11-03 DIAGNOSIS — I10 Essential (primary) hypertension: Secondary | ICD-10-CM

## 2015-11-03 DIAGNOSIS — N489 Disorder of penis, unspecified: Secondary | ICD-10-CM

## 2015-11-03 MED ORDER — LOSARTAN POTASSIUM-HCTZ 100-12.5 MG PO TABS: 1.0000 | ORAL_TABLET | Freq: Every day | ORAL | Status: DC

## 2015-11-03 NOTE — Telephone Encounter (Signed)
completed. Patient in office to be seen. Paper work to be given to him during his visit today.

## 2015-11-03 NOTE — Progress Notes (Addendum)
Subjective:  By signing my name below, I, Wayne Perez, attest that this documentation has been prepared under the direction and in the presence of [name], MD. Electronically Signed: [name], [place]. 11/03/2015 , 3:17 PM .  Patient was seen in Room 6 .   Patient ID: Wayne Perez, male    DOB: 03/30/1961, 55 y.o.   MRN: [id number] Chief Complaint  Patient presents with  . Follow-up    Perez pressure  . Chest x-ray    for school    HPI Wayne Perez is a 55 y.o. male Pt is here for follow up. He also requires a chest xray because he is working at a new clinical site for school.   HTN He is on cozaar 135m qd for HTN.  He had elevated reading last visit. Continued on losartan 1086m advised to call or return if still elevated  Lab Results  Component Value Date   CREATININE 0.96 10/28/2015   He measures his BP at home, highest 160/100. He denies any headache, lightheadedness, or dizziness. He's compliant with his medication.   DM He is on metformin 100054mid.  He is on amaryl 3mg71m.  He is on januvia 100mg58m   See last visit. No history of retina problem, or lower extremity neuropathy.   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 Form from his school indicated that chest xray was needed. At that time, he had 1 view chest xray, showed no active cardiopulmonary disease.   Penile discoloration Concern last visit regarding a penile discoloration just on the head of the penis from 2 weeks prior. Otherwise asymptomatic, no discharge, no treatments. It hasn't changed and still present. He denies discharge or pain. He denies any cryotherapy for removal of warts on the penis.   Patient Active Problem List   Diagnosis Date Noted  . Erectile dysfunction due to arterial insufficiency 08/07/2015  . Varicocele 08/07/2015  . Diabetes (HCC) [place]25/2013  . HTN (hypertension) 07/31/2012   Past Medical History  Diagnosis Date  . Diabetes mellitus  without complication (HCC) [place] Hypertension   . Hyperlipidemia   . GERD (gastroesophageal reflux disease)    Past Surgical History  Procedure Laterality Date  . Colonoscopy     Allergies  Allergen Reactions  . Ace Inhibitors Itching   Prior to Admission medications   Medication Sig Start Date End Date Taking? Authorizing Provider  aspirin 81 MG tablet Take 81 mg by mouth daily.   Yes Historical Provider, MD  Perez Glucose Monitoring Suppl KIT One Touch Ultra or may substitute for what insurance covers.  May check once per day 09/09/14  Yes JeffrWendie Agreste esomeprazole (NEXIUM) 40 MG capsule Take 1 capsule (40 mg total) by mouth daily. 10/28/15  Yes JeffrWendie Agreste glimepiride (AMARYL) 2 MG tablet Take 1.5 tablets (3 mg total) by mouth daily before breakfast. 10/28/15  Yes JeffrWendie Agreste losartan (COZAAR) 100 MG tablet Take 1 tablet (100 mg total) by mouth daily. 10/28/15  Yes JeffrWendie Agreste metFORMIN (GLUCOPHAGE) 1000 MG tablet Take 1 tablet (1,000 mg total) by mouth 2 (two) times daily with a meal. 10/28/15  Yes JeffrWendie Agreste simvastatin (ZOCOR) 20 MG tablet TAKE 1 TABLET BY MOUTH EVERY EVENING. 10/28/15  Yes JeffrWendie Agreste sitaGLIPtin (JANUVIA) 100 MG tablet Take 1 tablet (100 mg total) by mouth daily. 10/28/15  Yes JeffrWendie Agreste  Social History   Social History  . Marital Status: Married    Spouse Name: N/A  . Number of Children: 3  . Years of Education: N/A   Occupational History  . Cook    Social History Main Topics  . Smoking status: Never Smoker   . Smokeless tobacco: Never Used  . Alcohol Use: No  . Drug Use: No  . Sexual Activity: Yes   Other Topics Concern  . Not on file   Social History Narrative   Married. Education: [employer]. Exercise: Walks twice a week for 1-2 hours.   Review of Systems  Constitutional: Negative for fatigue and unexpected weight change.  Eyes: Negative for visual disturbance.  Respiratory:  Negative for cough, chest tightness and shortness of breath.   Cardiovascular: Negative for chest pain, palpitations and leg swelling.  Gastrointestinal: Negative for abdominal pain and Perez in stool.  Genitourinary: Negative for penile pain.  Neurological: Negative for dizziness, light-headedness and headaches.      Objective:   Physical Exam  Constitutional: He is oriented to person, place, and time. He appears well-developed and well-nourished.  HENT:  Head: Normocephalic and atraumatic.  Eyes: EOM are normal. Pupils are equal, round, and reactive to light.  Neck: No JVD present. Carotid bruit is not present.  Cardiovascular: Normal rate, regular rhythm and normal heart sounds.   No murmur heard. Pulmonary/Chest: Effort normal and breath sounds normal. He has no rales.  Genitourinary: Uncircumcised. No discharge found.  At head of penis, 1cm faint hypopigmentation just lateral urethral meatus approximately 50m hyperpigmentation to the right  Musculoskeletal: He exhibits no edema.  Neurological: He is alert and oriented to person, place, and time.  Skin: Skin is warm and dry.  Psychiatric: He has a normal mood and affect.  Vitals reviewed.   Filed Vitals:   11/03/15 1338 11/03/15 1443  BP: 138/90 158/96  Pulse: 75   Temp: 98.2 F (36.8 C)   TempSrc: Oral   Resp: 18   Height: 5' 10.25" (1.784 m)   Weight: 206 lb 6.4 oz (93.622 kg)   SpO2: 99%    Dg Chest 1 View  11/03/2015  CLINICAL DATA:  Positive TB test. EXAM: CHEST 1 VIEW COMPARISON:  05/27/2015. FINDINGS: Mediastinum and hilar structures normal. Low lung volumes with mild bibasilar atelectasis. Heart size normal. No pleural effusion pneumothorax. IMPRESSION: No acute cardiopulmonary disease. Mild bibasilar atelectasis and or infiltrate. Electronically Signed   By: TMarcello Moores Register   On: 11/03/2015 15:55       Assessment & Plan:   Wayne Bucklewis a 55y.o. male Essential hypertension - Plan: DG Chest 1 View,  losartan-hydrochlorothiazide (HYZAAR) 100-12.5 MG tablet  - Decreased control. Will add HCTZ to current losartan for combination pill as above. Side effects discussed, monitor outside Perez pressures, and recheck at 3 month visit. Sooner if remains elevated  Positive QuantiFERON-TB Gold test - Plan: DG Chest 1 View  -History of positive point furuncle, asymptomatic. Normal chest x-ray in September, repeat needed today for new clinicals.   -No apparent new nodules. Mild bibasilar atelectasis likely, asymptomatic. Advised if any cough, shortness of breath, fever, return for recheck.  Penile abnormality  -Area of hypopigmentation without other symptoms, no previous topical steroid or cryogenic treatment. Has scheduled follow-up with urology in the next approximate 6 weeks, plans on discussing with them at that follow-up. Advised if any increase in size, or new symptoms, to be seen here or by urology sooner.  Meds ordered this encounter               .  losartan-hydrochlorothiazide (HYZAAR) 100-12.5 MG tablet    Sig: Take 1 tablet by mouth daily.    Dispense:  90 tablet    Refill:  3   Patient Instructions  Stop losartan and start new medication which is a combination pill of both losartan and hydrochlorothiazide. Still once per day. Keep an eye on your Perez pressures outside the office, and if not remain under 140/90 in the next 3-4 weeks, let me know to discuss further changes.  Follow-up with urology as planned for the area of hypopigmentation. If this area spreads, any itching or discharge, or other new or worsening symptoms, return here or see urology sooner.  Follow up in 3 months for diabetes.     I personally performed the services described in this documentation, which was scribed in my presence. The recorded information has been reviewed and considered, and addended by me as needed.

## 2015-11-03 NOTE — Patient Instructions (Signed)
Stop losartan and start new medication which is a combination pill of both losartan and hydrochlorothiazide. Still once per day. Keep an eye on your blood pressures outside the office, and if not remain under 140/90 in the next 3-4 weeks, let me know to discuss further changes.  Follow-up with urology as planned for the area of hypopigmentation. If this area spreads, any itching or discharge, or other new or worsening symptoms, return here or see urology sooner.  Follow up in 3 months for diabetes.

## 2015-11-03 NOTE — Telephone Encounter (Signed)
Pt req. Insurance documentation of physical exam be sent to [employer] @ [phone] and confirmation be mailed to the verified address on file. All actions complete,  K.Reid

## 2015-11-10 ENCOUNTER — Encounter: Payer: Self-pay | Admitting: *Deleted

## 2016-01-28 ENCOUNTER — Ambulatory Visit: Payer: Managed Care, Other (non HMO) | Admitting: Family Medicine

## 2016-03-18 ENCOUNTER — Ambulatory Visit: Payer: Managed Care, Other (non HMO) | Admitting: Family Medicine

## 2016-05-01 ENCOUNTER — Other Ambulatory Visit: Payer: Self-pay | Admitting: Family Medicine

## 2016-05-01 DIAGNOSIS — E785 Hyperlipidemia, unspecified: Secondary | ICD-10-CM

## 2016-05-01 DIAGNOSIS — K219 Gastro-esophageal reflux disease without esophagitis: Secondary | ICD-10-CM

## 2016-05-01 DIAGNOSIS — E119 Type 2 diabetes mellitus without complications: Secondary | ICD-10-CM

## 2016-05-11 ENCOUNTER — Ambulatory Visit: Payer: Managed Care, Other (non HMO)

## 2016-06-05 ENCOUNTER — Ambulatory Visit (INDEPENDENT_AMBULATORY_CARE_PROVIDER_SITE_OTHER): Payer: Managed Care, Other (non HMO) | Admitting: Family Medicine

## 2016-06-05 VITALS — BP 150/80 | HR 88 | Temp 98.1°F | Resp 16 | Ht 70.5 in | Wt 218.6 lb

## 2016-06-05 DIAGNOSIS — R300 Dysuria: Secondary | ICD-10-CM | POA: Diagnosis not present

## 2016-06-05 DIAGNOSIS — N400 Enlarged prostate without lower urinary tract symptoms: Secondary | ICD-10-CM

## 2016-06-05 DIAGNOSIS — N410 Acute prostatitis: Secondary | ICD-10-CM | POA: Diagnosis not present

## 2016-06-05 DIAGNOSIS — E119 Type 2 diabetes mellitus without complications: Secondary | ICD-10-CM | POA: Diagnosis not present

## 2016-06-05 DIAGNOSIS — I10 Essential (primary) hypertension: Secondary | ICD-10-CM

## 2016-06-05 DIAGNOSIS — E785 Hyperlipidemia, unspecified: Secondary | ICD-10-CM

## 2016-06-05 DIAGNOSIS — K219 Gastro-esophageal reflux disease without esophagitis: Secondary | ICD-10-CM | POA: Diagnosis not present

## 2016-06-05 LAB — POCT URINALYSIS DIP (MANUAL ENTRY)
BILIRUBIN UA: NEGATIVE
Bilirubin, UA: NEGATIVE
Glucose, UA: NEGATIVE
Leukocytes, UA: NEGATIVE
Nitrite, UA: NEGATIVE
PROTEIN UA: NEGATIVE
UROBILINOGEN UA: 0.2
pH, UA: 5.5

## 2016-06-05 LAB — POC MICROSCOPIC URINALYSIS (UMFC): MUCUS RE: ABSENT

## 2016-06-05 MED ORDER — SULFAMETHOXAZOLE-TRIMETHOPRIM 800-160 MG PO TABS
1.0000 | ORAL_TABLET | Freq: Two times a day (BID) | ORAL | 0 refills | Status: DC
Start: 2016-06-05 — End: 2016-11-01

## 2016-06-05 MED ORDER — TAMSULOSIN HCL 0.4 MG PO CAPS: 0.4000 mg | ORAL_CAPSULE | Freq: Every day | ORAL | 3 refills | Status: DC

## 2016-06-05 MED ORDER — GLIMEPIRIDE 2 MG PO TABS: 3.0000 mg | ORAL_TABLET | Freq: Every day | ORAL | 0 refills | Status: DC

## 2016-06-05 MED ORDER — SITAGLIPTIN PHOSPHATE 100 MG PO TABS: 100.0000 mg | ORAL_TABLET | Freq: Every day | ORAL | 0 refills | Status: DC

## 2016-06-05 MED ORDER — SIMVASTATIN 20 MG PO TABS: 20.0000 mg | ORAL_TABLET | Freq: Every evening | ORAL | 0 refills | Status: DC

## 2016-06-05 MED ORDER — ESOMEPRAZOLE MAGNESIUM 40 MG PO CPDR: 40.0000 mg | DELAYED_RELEASE_CAPSULE | Freq: Every day | ORAL | 0 refills | Status: DC

## 2016-06-05 MED ORDER — LOSARTAN POTASSIUM-HCTZ 100-12.5 MG PO TABS: 1.0000 | ORAL_TABLET | Freq: Every day | ORAL | 0 refills | Status: DC

## 2016-06-05 MED ORDER — METFORMIN HCL 1000 MG PO TABS: 1000.0000 mg | ORAL_TABLET | Freq: Two times a day (BID) | ORAL | 0 refills | Status: DC

## 2016-06-05 NOTE — Patient Instructions (Addendum)
  Great to meet you!  Start Bactrim, and antibiotic today. Continue for until pills are finished.  Start Flomax today, this will relax your prostate and allow your urine to flow more smoothly.  If you develop the inability to urinate for more than 4 hours please seek emergency medical care.  Please follow-up in one month to see her PCP to discuss diabetes and follow-up to see how your prostate is doing.   Prostatitis Prostatitis is redness, soreness, and puffiness (swelling) of the prostate gland. The prostate gland is the walnut-sized gland located just below your bladder. HOME CARE:   Take all medicines as told by your doctor.  Take warm-water baths (sitz baths) as told by your doctor. GET HELP IF:  Your symptoms get worse, not better.  You have a fever. GET HELP RIGHT AWAY IF:   You have chills.  You feel sick to your stomach (nauseous) or like you will throw up (vomit).  You feel lightheaded or like you will pass out (faint).  You are unable to pee (urinate).  You have blood or blood clumps (clots) in your pee (urine). MAKE SURE YOU:  Understand these instructions.  Will watch your condition.  Will get help right away if you are not doing well or get worse.   This information is not intended to replace advice given to you by your health care provider. Make sure you discuss any questions you have with your health care provider.   Document Released: 02/22/2012 Document Revised: 09/13/2014 Document Reviewed: 03/12/2013 Elsevier Interactive Patient Education 2016 [employer].    IF you received an x-ray today, you will receive an invoice from [facility]. Please contact [facility] at [phone] with questions or concerns regarding your invoice.   IF you received labwork today, you will receive an invoice from [employer]. Please contact [facility] at [phone] with questions or concerns regarding your invoice.    Our billing staff will not be able to assist you with questions regarding bills from these companies.  You will be contacted with the lab results as soon as they are available. The fastest way to get your results is to activate your My Chart account. Instructions are located on the last page of this paperwork. If you have not heard from [place] regarding the results in 2 weeks, please contact this office.

## 2016-06-05 NOTE — Progress Notes (Addendum)
   HPI  Patient presents today here with dysuria and difficulty urinating.  Patient explains he has had symptoms for about 4 days. He describes 3-4+ episodes of nocturia for several months.  The rest of his symptoms have started over the last 4 days Including incomplete emptying, difficulty urination, urinating small amounts several more times than usual. Urethral irritation. He denies any concern for STI, he is sexually active with only his wife. He denies penile discharge. He denies difficulty tolerating foods or fluids but does state that he had subjective fever 3 nights ago with some chills.  PMH: Smoking status noted ROS: Per HPI  Objective: BP (!) 150/80 (BP Location: Right Arm, Patient Position: Sitting, Cuff Size: Normal)   Pulse 88   Temp 98.1 F (36.7 C) (Oral)   Resp 16   Ht 5' 10.5" (1.791 m)   Wt 218 lb 9.6 oz (99.2 kg)   SpO2 100%   BMI 30.92 kg/m  Gen: NAD, alert, cooperative with exam HEENT: NCAT CV: RRR, good S1/S2, no murmur Resp: CTABL, no wheezes, non-labored Ext: No edema, warm Neuro: Alert and oriented, No gross deficits  DRE Normal rectal tone, enlarged smooth prostate with no nodules, mild tenderness to palpation  Assessment and plan:  # Acute prostatitis Likely with underlying BPH Treat with Bactrim and Flomax Discussed red flags, specifically if not able to urinate seek emergency medical care to have catheter placed Currently he has incomplete bladder emptying and difficulty urinating that he can pass urine easily. Culture, UA is negative for signs of infection. deferred PSA given that it would likely be elevated and acute situation  # Diabetes, hyperlipidemia, GERD, hypertension Refill chronic medications, however I did not discuss them at length today Recommended 1 month follow-up with PCP, I provided 3 months refills today.     Orders Placed This Encounter  Procedures  . POCT Microscopic Urinalysis ([facility])  . POCT urinalysis  dipstick    Meds ordered this encounter  Medications  . sitaGLIPtin (JANUVIA) 100 MG tablet    Sig: Take 1 tablet (100 mg total) by mouth daily.    Dispense:  90 tablet    Refill:  0  . simvastatin (ZOCOR) 20 MG tablet    Sig: Take 1 tablet (20 mg total) by mouth every evening.    Dispense:  90 tablet    Refill:  0    Office visit needed  . metFORMIN (GLUCOPHAGE) 1000 MG tablet    Sig: Take 1 tablet (1,000 mg total) by mouth 2 (two) times daily with a meal.    Dispense:  180 tablet    Refill:  0    Office visit needed for refills  . losartan-hydrochlorothiazide (HYZAAR) 100-12.5 MG tablet    Sig: Take 1 tablet by mouth daily.    Dispense:  90 tablet    Refill:  0  . glimepiride (AMARYL) 2 MG tablet    Sig: Take 1.5 tablets (3 mg total) by mouth daily before breakfast.    Dispense:  135 tablet    Refill:  0  . esomeprazole (NEXIUM) 40 MG capsule    Sig: Take 1 capsule (40 mg total) by mouth daily.    Dispense:  90 capsule    Refill:  0    Office visit needed    Laroy Apple, MD  06/05/2016, 10:09 AM

## 2016-06-06 ENCOUNTER — Emergency Department (HOSPITAL_COMMUNITY)
Admission: EM | Admit: 2016-06-06 | Discharge: 2016-06-06 | Disposition: A | Discharge status: Home / Self Care | Payer: Managed Care, Other (non HMO) | Attending: Emergency Medicine | Admitting: Emergency Medicine

## 2016-06-06 ENCOUNTER — Encounter (HOSPITAL_COMMUNITY): Payer: Self-pay

## 2016-06-06 DIAGNOSIS — Z7984 Long term (current) use of oral hypoglycemic drugs: Secondary | ICD-10-CM | POA: Diagnosis not present

## 2016-06-06 DIAGNOSIS — E119 Type 2 diabetes mellitus without complications: Secondary | ICD-10-CM | POA: Insufficient documentation

## 2016-06-06 DIAGNOSIS — R339 Retention of urine, unspecified: Secondary | ICD-10-CM | POA: Diagnosis present

## 2016-06-06 DIAGNOSIS — I10 Essential (primary) hypertension: Secondary | ICD-10-CM | POA: Diagnosis not present

## 2016-06-06 DIAGNOSIS — Z7982 Long term (current) use of aspirin: Secondary | ICD-10-CM | POA: Diagnosis not present

## 2016-06-06 NOTE — ED Notes (Signed)
Patient called x1, no answer

## 2016-06-06 NOTE — Discharge Instructions (Signed)
Please continue taking her Bactrim and Flomax as prescribed. Leave the foley catheter inserted. Please follow up with the urologist tomorrow. May take Tylenol or ibuprofen for pain. Please return to ED if you develop fevers, increased pain, abdominal pain, or for any other reason.

## 2016-06-06 NOTE — ED Provider Notes (Signed)
[facility] DEPT Provider Note   CSN: [id number] Arrival date & time: 06/06/16  0549     History   Chief Complaint Chief Complaint  Patient presents with  . Urinary Retention    HPI Wayne Perez is a 55 y.o. male.  36-year-old African-American male past history sitting for diabetes, hypertension, hyperlipidemia presents to the ED today with difficulty urinating. Patient was seen at urgent care yesterday with rectal exam and diagnosed with prostatitis. He was given Bactrim and Flomax. UA yesterday without signs of infection. Patient states that he is taking his Bactrim twice and Flomax once. Patient is coming in today with continued difficulty urinating. Patient states that he has to "push very hard" to get the urine to come out. Patient also endorses dysuria, urgency and frequency. Denies any hematuria. Patient is able to urinate but with some difficulty. Patient states that this has been ongoing for the past 3-4 days. Denies any symptoms like this before. Nothing makes better or worse. Denies any fever, chills, ha, vision changes, cp, sob, n/v/d, abd pain, change in bowel habits, testicular pain/swelling, penile discharge.      Past Medical History:  Diagnosis Date  . Diabetes mellitus without complication ([place])   . GERD (gastroesophageal reflux disease)   . Hyperlipidemia   . Hypertension     Patient Active Problem List   Diagnosis Date Noted  . Erectile dysfunction due to arterial insufficiency 08/07/2015  . Varicocele 08/07/2015  . Diabetes ([place]) 07/31/2012  . HTN (hypertension) 07/31/2012    Past Surgical History:  Procedure Laterality Date  . COLONOSCOPY         Home Medications    Prior to Admission medications   Medication Sig Start Date End Date Taking? Authorizing Provider  aspirin 81 MG tablet Take 81 mg by mouth daily.    Historical Provider, MD  Blood Glucose Monitoring Suppl KIT One Touch Ultra or may substitute for what insurance covers.  May check  once per day 09/09/14   [name], MD  esomeprazole (NEXIUM) 40 MG capsule Take 1 capsule (40 mg total) by mouth daily. 06/05/16   Timmothy Euler, MD  glimepiride (AMARYL) 2 MG tablet Take 1.5 tablets (3 mg total) by mouth daily before breakfast. 06/05/16   Timmothy Euler, MD  losartan-hydrochlorothiazide (HYZAAR) 100-12.5 MG tablet Take 1 tablet by mouth daily. 06/05/16   Timmothy Euler, MD  metFORMIN (GLUCOPHAGE) 1000 MG tablet Take 1 tablet (1,000 mg total) by mouth 2 (two) times daily with a meal. 06/05/16   Timmothy Euler, MD  simvastatin (ZOCOR) 20 MG tablet Take 1 tablet (20 mg total) by mouth every evening. 06/05/16   Timmothy Euler, MD  sitaGLIPtin (JANUVIA) 100 MG tablet Take 1 tablet (100 mg total) by mouth daily. 06/05/16   Timmothy Euler, MD  sulfamethoxazole-trimethoprim (BACTRIM DS) 800-160 MG tablet Take 1 tablet by mouth 2 (two) times daily. 06/05/16   Timmothy Euler, MD  tamsulosin (FLOMAX) 0.4 MG CAPS capsule Take 1 capsule (0.4 mg total) by mouth daily. 06/05/16   Timmothy Euler, MD  VIAGRA 100 MG tablet  10/06/15   Historical Provider, MD    Family History No family history on file.  Social History Social History  Substance Use Topics  . Smoking status: Never Smoker  . Smokeless tobacco: Never Used  . Alcohol use No     Allergies   Ace inhibitors   Review of Systems Review of Systems  Constitutional: Negative for chills  and fever.  HENT: Negative for congestion, ear pain, rhinorrhea and sore throat.   Eyes: Negative for pain and discharge.  Respiratory: Negative for cough and shortness of breath.   Cardiovascular: Negative for chest pain and palpitations.  Gastrointestinal: Negative for abdominal pain, diarrhea, nausea and vomiting.  Genitourinary: Positive for difficulty urinating, dysuria, frequency and urgency. Negative for discharge, flank pain, hematuria, penile swelling, scrotal swelling and testicular pain.  Musculoskeletal:  Negative for myalgias and neck pain.  Neurological: Negative for dizziness, syncope, weakness, light-headedness, numbness and headaches.  All other systems reviewed and are negative.    Physical Exam Updated Vital Signs BP 133/94 (BP Location: Left Arm)   Pulse 85   Temp 98.5 F (36.9 C) (Oral)   Resp 16   SpO2 96%   Physical Exam  Constitutional: He appears well-developed and well-nourished. No distress.  HENT:  Head: Normocephalic and atraumatic.  Mouth/Throat: Oropharynx is clear and moist.  Eyes: Conjunctivae are normal. Right eye exhibits no discharge. Left eye exhibits no discharge. No scleral icterus.  Neck: Normal range of motion. Neck supple. No thyromegaly present.  Cardiovascular: Normal rate, regular rhythm, normal heart sounds and intact distal pulses.   Pulmonary/Chest: Effort normal and breath sounds normal.  Abdominal: Soft. Bowel sounds are normal. He exhibits no distension. There is no tenderness. There is no rigidity, no rebound, no guarding and no CVA tenderness.  Musculoskeletal: Normal range of motion.  Lymphadenopathy:    He has no cervical adenopathy.  Neurological: He is alert.  Skin: Skin is warm and dry. Capillary refill takes less than 2 seconds.  Nursing note and vitals reviewed.    ED Treatments / Results  Labs (all labs ordered are listed, but only abnormal results are displayed) Labs Reviewed - No data to display  EKG  EKG Interpretation None       Radiology No results found.  Procedures Procedures (including critical care time)  Medications Ordered in ED Medications - No data to display   Initial Impression / Assessment and Plan / ED Course  I have reviewed the triage vital signs and the nursing notes.  Pertinent labs & imaging results that were available during my care of the patient were reviewed by me and considered in my medical decision making (see chart for details).  Clinical Course  Patient presents with difficulty  urinating. Patient was seen in urgent care yesterday and diagnosed with acute prostatitis. UA was sent without signs of infection. Urine culture pending. Patient denies any hematuria. Patient was started on Flomax and Bactrim. Continued to have difficulty urinating today. Bladder scan revealed 500+ cc of urine. Foley catheter inserted with immediate return of 500 mL of urine. Patient with significant relief. UA not sent today due to recent result yesterday. UA Cultures still pending. Low suspicion for prostatitis given patient's presentation. Patient is nontoxic appearing and afebrile. Rectal exam performed yesterday revealed only mild tenderness. Likely BPH however given the extensive amount of urine in bladder will send patient home with Foley leg bag. Patient encouraged to continue taking his Bactrim and Flomax. Given referral to urology. Patient discussed with Dr. Betsey Holiday who agrees with the above plan. Patient will be discharged home in no acute distress with stable vital signs. Hemodynamically stable. Strict return precautions given. Patient was understanding with the plan of care this morning.  Final Clinical Impressions(s) / ED Diagnoses   Final diagnoses:  Urinary retention    New Prescriptions New Prescriptions   No medications on file  Doristine Devoid, PA-C 06/06/16 [place], MD 06/10/16 [phone]

## 2016-06-06 NOTE — ED Notes (Signed)
Leg bag applied and instructions given pertaining to same. We also issue a foley bag for nighttime use.

## 2016-06-06 NOTE — ED Triage Notes (Signed)
Wayne Perez c/o difficulty urinating.  Wayne Perez states that he has to push "very hard" to get the urine to come out and it is painful.  Wayne Perez states that he is feeling that he has to go to the restroom every 15 minutes.  Wayne Perez was seen at [facility] yesterday for the same.  Wayne Perez was prescribed Bactrim and Flomax which he states he has taken.

## 2016-06-07 LAB — URINE CULTURE: Organism ID, Bacteria: NO GROWTH

## 2016-06-18 ENCOUNTER — Telehealth: Payer: Self-pay

## 2016-06-18 NOTE — Telephone Encounter (Signed)
Completed PA on covermymeds for Januvia. Pending.

## 2016-06-22 NOTE — Telephone Encounter (Signed)
Got fax from [facility] stating that they do not manage pt's meds. Tracked down correct ph #, which was [facility] and was able to complete PA on phone. Approved through 06/22/17, case # [id number]. Notified pharm.

## 2016-10-22 ENCOUNTER — Other Ambulatory Visit: Payer: Self-pay | Admitting: Family Medicine

## 2016-10-22 DIAGNOSIS — E119 Type 2 diabetes mellitus without complications: Secondary | ICD-10-CM

## 2016-10-22 NOTE — Telephone Encounter (Signed)
Seen at urgent care, recommended 1 mo f/u with PCP.   Will provide 1 month supply, will decline next request.   Laroy Apple, MD [facility] 10/22/2016, 10:31 AM

## 2016-11-01 ENCOUNTER — Ambulatory Visit (INDEPENDENT_AMBULATORY_CARE_PROVIDER_SITE_OTHER): Payer: Managed Care, Other (non HMO) | Admitting: Family Medicine

## 2016-11-01 VITALS — BP 130/90 | HR 78 | Temp 98.2°F | Resp 16 | Ht 69.0 in | Wt 209.0 lb

## 2016-11-01 DIAGNOSIS — E785 Hyperlipidemia, unspecified: Secondary | ICD-10-CM | POA: Diagnosis not present

## 2016-11-01 DIAGNOSIS — Z5181 Encounter for therapeutic drug level monitoring: Secondary | ICD-10-CM | POA: Diagnosis not present

## 2016-11-01 DIAGNOSIS — E119 Type 2 diabetes mellitus without complications: Secondary | ICD-10-CM | POA: Diagnosis not present

## 2016-11-01 DIAGNOSIS — I10 Essential (primary) hypertension: Secondary | ICD-10-CM | POA: Diagnosis not present

## 2016-11-01 DIAGNOSIS — Z0000 Encounter for general adult medical examination without abnormal findings: Secondary | ICD-10-CM

## 2016-11-01 DIAGNOSIS — K219 Gastro-esophageal reflux disease without esophagitis: Secondary | ICD-10-CM | POA: Diagnosis not present

## 2016-11-01 MED ORDER — ESOMEPRAZOLE MAGNESIUM 40 MG PO CPDR: 40.0000 mg | DELAYED_RELEASE_CAPSULE | Freq: Every day | ORAL | 1 refills | Status: DC

## 2016-11-01 MED ORDER — SIMVASTATIN 20 MG PO TABS: 20.0000 mg | ORAL_TABLET | Freq: Every evening | ORAL | 1 refills | Status: DC

## 2016-11-01 MED ORDER — METFORMIN HCL 1000 MG PO TABS: 1000.0000 mg | ORAL_TABLET | Freq: Two times a day (BID) | ORAL | 1 refills | Status: DC

## 2016-11-01 MED ORDER — LOSARTAN POTASSIUM-HCTZ 100-25 MG PO TABS: 1.0000 | ORAL_TABLET | Freq: Every day | ORAL | 1 refills | Status: DC

## 2016-11-01 MED ORDER — GLIMEPIRIDE 2 MG PO TABS: 3.0000 mg | ORAL_TABLET | Freq: Every day | ORAL | 1 refills | Status: DC

## 2016-11-01 MED ORDER — SITAGLIPTIN PHOSPHATE 100 MG PO TABS: 100.0000 mg | ORAL_TABLET | Freq: Every day | ORAL | 1 refills | Status: DC

## 2016-11-01 NOTE — Patient Instructions (Addendum)
Monitor your blood pressures on the higher dose of HCTZ. If still remaining over 140/90, return to discuss other changes.  I will check some different electrolytes as the Nexium can cause low levels. Try one half pill per day to see if that controls  symptoms just as well.  Depending on diabetes level, may need to change medications. Either way, follow-up with me in 3 months for diabetes follow-up.   Keeping you healthy  Get these tests  Blood pressure- Have your blood pressure checked once a year by your healthcare provider.  Normal blood pressure is 120/80  Weight- Have your body mass index (BMI) calculated to screen for obesity.  BMI is a measure of body fat based on height and weight. You can also calculate your own BMI at [email or web address].  Cholesterol- Have your cholesterol checked every year.  Diabetes- Have your blood sugar checked regularly if you have high blood pressure, high cholesterol, have a family history of diabetes or if you are overweight.  Screening for Colon Cancer- Colonoscopy starting at age 29.  Screening may begin sooner depending on your family history and other health conditions. Follow up colonoscopy as directed by your Gastroenterologist.  Screening for Prostate Cancer- Both blood work (PSA) and a rectal exam help screen for Prostate Cancer.  Screening begins at age 69 with African-American men and at age 40 with Caucasian men.  Screening may begin sooner depending on your family history.  Take these medicines  Aspirin- One aspirin daily can help prevent Heart disease and Stroke.  Flu shot- Every fall.  Tetanus- Every 10 years.  Zostavax- Once after the age of 81 to prevent Shingles.  Pneumonia shot- Once after the age of 59; if you are younger than 60, ask your healthcare provider if you need a Pneumonia shot.  Take these steps  Don't smoke- If you do smoke, talk to your doctor about quitting.  For tips on how to quit, go to www.smokefree.gov  or call 1-800-QUIT-NOW.  Be physically active- Exercise 5 days a week for at least 30 minutes.  If you are not already physically active start slow and gradually work up to 30 minutes of moderate physical activity.  Examples of moderate activity include walking briskly, mowing the yard, dancing, swimming, bicycling, etc.  Eat a healthy diet- Eat a variety of healthy food such as fruits, vegetables, low fat milk, low fat cheese, yogurt, lean meant, poultry, fish, beans, tofu, etc. For more information go to www.thenutritionsource.org  Drink alcohol in moderation- Limit alcohol intake to less than two drinks a day. Never drink and drive.  Dentist- Brush and floss twice daily; visit your dentist twice a year.  Depression- Your emotional health is as important as your physical health. If you're feeling down, or losing interest in things you would normally enjoy please talk to your healthcare provider.  Eye exam- Visit your eye doctor every year.  Safe sex- If you may be exposed to a sexually transmitted infection, use a condom.  Seat belts- Seat belts can save your life; always wear one.  Smoke/Carbon Monoxide detectors- These detectors need to be installed on the appropriate level of your home.  Replace batteries at least once a year.  Skin cancer- When out in the sun, cover up and use sunscreen 15 SPF or higher.  Violence- If anyone is threatening you, please tell your healthcare provider.  Living Will/ Health care power of attorney- Speak with your healthcare provider and family. Diabetes Mellitus and Standards of Medical  Care Managing diabetes (diabetes mellitus) can be complicated. Your diabetes treatment may be managed by a team of health care providers, including:  A diet and nutrition specialist (registered dietitian).  A nurse.  A certified diabetes educator (CDE).  A diabetes specialist (endocrinologist).  An eye doctor.  A primary care provider.  A dentist. Your health  care providers follow a schedule in order to help you get the best quality of care. The following schedule is a general guideline for your diabetes management plan. Your health care providers may also give you more specific instructions. HbA1c ( hemoglobin A1c) test This test provides information about blood sugar (glucose) control over the previous 2-3 months. It is used to check whether your diabetes management plan needs to be adjusted.  If you are meeting your treatment goals, this test is done at least 2 times a year.  If you are not meeting treatment goals or if your treatment goals have changed, this test is done 4 times a year. Blood pressure test  This test is done at every routine medical visit. For most people, the goal is less than 140/90. In some cases, your goal blood pressure may be 130/80 or less. Ask your health care provider what your goal blood pressure should be. Dental and eye exams  Visit your dentist two times a year.  If you have type 1 diabetes, get an eye exam 3-5 years after you are diagnosed, and then once a year after your first exam.  If you were diagnosed with type 1 diabetes as a child, get an eye exam when you are age 10 or older and have had diabetes for 3-5 years. After the first exam, you should get an eye exam once a year.  If you have type 2 diabetes, have an eye exam as soon as you are diagnosed, and then once a year after your first exam. Foot care exam  Visual foot exams are done at every routine medical visit. The exams check for cuts, bruises, redness, blisters, sores, or other problems with the feet.  A complete foot exam is done by your health care provider once a year. This exam includes an inspection of the structure and skin of your feet, and a check of the pulses and sensation in your feet.  Type 1 diabetes: Get your first exam 3-5 years after diagnosis.  Type 2 diabetes: Get your first exam as soon as you are diagnosed.  Check your feet  every day for cuts, bruises, redness, blisters, or sores. If you have any of these or other problems that are not healing, contact your health care provider. Kidney function test ( urine microalbumin)  This test is done once a year.  Type 1 diabetes: Get your first test 5 years after diagnosis.  Type 2 diabetes: Get your first test as soon as you are diagnosed.  If you have chronic kidney disease (CKD), get a serum creatinine and estimated glomerular filtration rate (eGFR) test once a year. Lipid profile (cholesterol, HDL, LDL, triglycerides)  This test should be done when you are diagnosed with diabetes, and every 5 years after the first test. If you are on medicines to lower your cholesterol, you may need to get this test done every year.  The goal for LDL is less than 100 mg/dL (5.5 mmol/L). If you are at high risk, the goal is less than 70 mg/dL (3.9 mmol/L).  The goal for HDL is 40 mg/dL (2.2 mmol/L) for men and 50 mg/dL(2.8   mmol/L) for women. An HDL cholesterol of 60 mg/dL (3.3 mmol/L) or higher gives some protection against heart disease.  The goal for triglycerides is less than 150 mg/dL (8.3 mmol/L). Immunizations  The yearly flu (influenza) vaccine is recommended for everyone 6 months or older who has diabetes.  The pneumonia (pneumococcal) vaccine is recommended for everyone 2 years or older who has diabetes. If you are 65 or older, you may get the pneumonia vaccine as a series of two separate shots.  The hepatitis B vaccine is recommended for adults shortly after they have been diagnosed with diabetes.  The Tdap (tetanus, diphtheria, and pertussis) vaccine should be given:  According to normal childhood vaccination schedules, for children.  Every 10 years, for adults who have diabetes.  The shingles vaccine is recommended for people who have had chicken pox and are 50 years or older. Mental and emotional health  Screening for symptoms of eating disorders, anxiety, and  depression is recommended at the time of diagnosis and afterward as needed. If your screening shows that you have symptoms (you have a positive screening result), you may need further evaluation and be referred to a mental health care provider. Diabetes self-management education  Education about how to manage your diabetes is recommended at diagnosis and ongoing as needed. Treatment plan  Your treatment plan will be reviewed at every medical visit. Summary  Managing diabetes (diabetes mellitus) can be complicated. Your diabetes treatment may be managed by a team of health care providers.  Your health care providers follow a schedule in order to help you get the best quality of care.  Standards of care including having regular physical exams, blood tests, blood pressure monitoring, immunizations, screening tests, and education about how to manage your diabetes.  Your health care providers may also give you more specific instructions based on your individual health. This information is not intended to replace advice given to you by your health care provider. Make sure you discuss any questions you have with your health care provider. Document Released: 06/20/2009 Document Revised: 05/21/2016 Document Reviewed: 05/21/2016 [employer] Interactive Patient Education  2017 [employer]    IF you received an x-ray today, you will receive an invoice from [facility]. Please contact  Radiology at [phone] with questions or concerns regarding your invoice.   IF you received labwork today, you will receive an invoice from [facility]. Please contact [facility] at [phone] with questions or concerns regarding your invoice.   Our billing staff will not be able to assist you with questions regarding bills from these companies.  You will be contacted with the lab results as soon as they are available. The fastest way to get your results is to activate your My Chart account.  Instructions are located on the last page of this paperwork. If you have not heard from us regarding the results in 2 weeks, please contact this office.     

## 2016-11-01 NOTE — Progress Notes (Signed)
 By signing my name below, I, [name], attest that this documentation has been prepared under the direction and in the presence of [name], MD.  Electronically Signed: [name], Medical Scribe. 11/01/16. 8:52 AM.  Subjective:    Patient ID: Wayne Perez, male    DOB: 08/29/1961, 56 y.o.   MRN: [id number]  HPI Chief Complaint  Patient presents with  . Annual Exam    HPI Comments: Wayne Perez is a 56 y.o. male who presents to the [facility] for a complete physical.  DM: He's on metformin, januvia, and amaryl. Last office visit with me was Feb 2016. Planned on 3 month follow-up. Pt is compliant with his medication and his blood ranges 130-140, but lately it's been around 160.  Lab Results  Component Value Date   HGBA1C 7.6 10/28/2015   Lab Results  Component Value Date   MICROALBUR 5.7 (H) 03/03/2015   HTN: He takes hyzaar 100/12.5 mg QD and ASA 81 mg QD. Pt's bp ran 190/100 over the weekend. Denies chest pain. Lab Results  Component Value Date   CREATININE 0.96 10/28/2015   BP Readings from Last 3 Encounters:  11/01/16 130/90  06/06/16 133/94  06/05/16 (!) 150/80   GERD/Heartburn: Takes nexium daily.  HLD: He takes zocor 20 mg QD. Pt is compliant with zocor. Lab Results  Component Value Date   CHOL 135 03/03/2015   HDL 53 03/03/2015   LDLCALC 67 03/03/2015   TRIG 74 03/03/2015   CHOLHDL 2.5 03/03/2015   Lab Results  Component Value Date   ALT 39 03/03/2015   AST 33 03/03/2015   ALKPHOS 57 03/03/2015   BILITOT 0.7 03/03/2015   Cancer Screening: Prostate CA: He was dx with prostatitis in Sept 2017. Evaluated in ER for urinary retention. Suspected underlying BPH, referred to urology and has been treated with flomax. Pt is compliant with flomax and he's followed by urologist, [name], and last week his PSA was  3.9. Pt's last DRE was 3 months ago.  Lab Results  Component Value Date   PSA 2.13 05/12/2015   PSA 2.04  03/04/2014   PSA 1.77 02/26/2013  Colon CA: Colonoscopy was Sept 2015; one polyp removed, repeat in 10 years.  Immunizations: Immunization History  Administered Date(s) Administered  . Hepatitis B 03/05/2013  . Hepatitis B, adult 04/04/2013, 09/03/2013  . Influenza Split 05/08/2011, 07/31/2012, 06/20/2013  . Influenza,inj,Quad PF,36+ Mos 05/12/2015  . Influenza-Unspecified 06/03/2014, 06/05/2016  . Td 05/16/2009  . Tdap 05/12/2015   Vision: Pt is followed by an ophthalmologist and his last visit was March 2017.  Visual Acuity Screening   Right eye Left eye Both eyes  Without correction:     With correction: 20/25 20/15 20/15   Dentist: Pt is followed by a dentist and his last visit was Oct 2017.  Exercise: Pt exercises 5x a week.  Depression Screening: Depression screen PHQ 2/9 11/01/2016 06/05/2016 11/03/2015 10/28/2015 05/27/2015  Decreased Interest 0 0 0 0 0  Down, Depressed, Hopeless 0 0 0 0 0  PHQ - 2 Score 0 0 0 0 0    Patient Active Problem List   Diagnosis Date Noted  . Erectile dysfunction due to arterial insufficiency 08/07/2015  . Varicocele 08/07/2015  . Diabetes (HCC) 07/31/2012  . HTN (hypertension) 07/31/2012   Past Medical History:  Diagnosis Date  . Diabetes mellitus without complication (HCC)   . GERD (gastroesophageal reflux disease)   . Hyperlipidemia   . Hypertension      Past Surgical History:  Procedure Laterality Date  . COLONOSCOPY     Allergies  Allergen Reactions  . Ace Inhibitors Itching   Prior to Admission medications   Medication Sig Start Date End Date Taking? Authorizing Provider  aspirin 81 MG tablet Take 81 mg by mouth daily.   Yes Historical Provider, MD  Blood Glucose Monitoring Suppl KIT One Touch Ultra or may substitute for what insurance covers.  May check once per day 09/09/14  Yes Jeffrey R Greene, MD  esomeprazole (NEXIUM) 40 MG capsule Take 1 capsule (40 mg total) by mouth daily. 06/05/16  Yes Samuel L Bradshaw, MD    glimepiride (AMARYL) 2 MG tablet Take 1.5 tablets (3 mg total) by mouth daily before breakfast. 06/05/16  Yes Samuel L Bradshaw, MD  JANUVIA 100 MG tablet take 1 tablet by mouth once daily 10/22/16  Yes Samuel L Bradshaw, MD  losartan-hydrochlorothiazide (HYZAAR) 100-12.5 MG tablet Take 1 tablet by mouth daily. 06/05/16  Yes Samuel L Bradshaw, MD  metFORMIN (GLUCOPHAGE) 1000 MG tablet Take 1 tablet (1,000 mg total) by mouth 2 (two) times daily with a meal. 06/05/16  Yes Samuel L Bradshaw, MD  simvastatin (ZOCOR) 20 MG tablet Take 1 tablet (20 mg total) by mouth every evening. 06/05/16  Yes Samuel L Bradshaw, MD  tamsulosin (FLOMAX) 0.4 MG CAPS capsule Take 1 capsule (0.4 mg total) by mouth daily. 06/05/16  Yes Samuel L Bradshaw, MD   Social History   Social History  . Marital status: Married    Spouse name: N/A  . Number of children: 3  . Years of education: N/A   Occupational History  . [employer]   Social History Main Topics  . Smoking status: Never Smoker  . Smokeless tobacco: Never Used  . Alcohol use No  . Drug use: No  . Sexual activity: Yes   Other Topics Concern  . Not on file   Social History Narrative   Married. Education: College. Exercise: Walks twice a week for 1-2 hours.   Review of Systems  13 point ROS was negative. Objective:  Physical Exam  Constitutional: He is oriented to person, place, and time. He appears well-developed and well-nourished.  HENT:  Head: Normocephalic and atraumatic.  Right Ear: External ear normal.  Left Ear: External ear normal.  Mouth/Throat: Oropharynx is clear and moist.  Eyes: Conjunctivae and EOM are normal. Pupils are equal, round, and reactive to light.  Neck: Normal range of motion. Neck supple. No thyromegaly present.  Cardiovascular: Normal rate, regular rhythm, normal heart sounds and intact distal pulses.   Pulmonary/Chest: Effort normal and breath sounds normal. No respiratory distress. He has no wheezes.  Abdominal:  Soft. He exhibits no distension. There is no tenderness.  Genitourinary:  Genitourinary Comments: DRE deferred as followed by urology.  Musculoskeletal: Normal range of motion. He exhibits no edema or tenderness.  Lymphadenopathy:    He has no cervical adenopathy.  Neurological: He is alert and oriented to person, place, and time. He has normal reflexes.  Skin: Skin is warm and dry.  Psychiatric: He has a normal mood and affect. His behavior is normal.  Vitals reviewed.   Vitals:   11/01/16 0824 11/01/16 0835  BP: (!) 144/98 130/90  Pulse: 78   Resp: 16   Temp: 98.2 F (36.8 C)   TempSrc: Oral   SpO2: 98%   Weight: 209 lb (94.8 kg)   Height: 5' 9" (1.753 m)   Body mass index is 30.86 kg/m.   [  EKG Reading]: Sinus rhythm. No acute findings. No apparent significant change from 2014. Assessment & Plan:    Justine Cossin is a 56 y.o. male Annual physical exam - Plan: EKG 12-Lead  --anticipatory guidance as below in AVS, screening labs above. Health maintenance items as above in HPI discussed/recommended as applicable.   Type 2 diabetes mellitus without complication, without long-term current use of insulin ([place]) - Plan: HM Diabetes Foot Exam, Hemoglobin A1c, Microalbumin, urine, metFORMIN (GLUCOPHAGE) 1000 MG tablet, sitaGLIPtin (JANUVIA) 100 MG tablet, glimepiride (AMARYL) 2 MG tablet, EKG 12-Lead  -Check A1c, continue same doses of medication for now. Recommended routine follow-up discussed. Also obtain urine microalbumin, fasting lipids planned.   Hyperlipidemia, unspecified hyperlipidemia type - Plan: Comprehensive metabolic panel, Lipid panel, simvastatin (ZOCOR) 20 MG tablet  -Continue Zocor for now, plan on fasting lipids, CMP.  Essential hypertension - Plan: Comprehensive metabolic panel, losartan-hydrochlorothiazide (HYZAAR) 100-25 MG tablet, EKG 12-Lead  -Decreased control, including with having diabetes. Increase HCTZ to 25 mg, continue same dose of losartan in combination.  EKG without concerning findings. Monitor home readings, RTC precautions if remains elevated.  Gastroesophageal reflux disease, esophagitis presence not specified - Plan: esomeprazole (NEXIUM) 40 MG capsule  -Tolerating Nexium, advised to try half pill dosing or every other day if possible. I also discussed with pharmacist if unable to split current medication as other options may be available. Monitoring for B12, vitamin D deficiency and magnesium obtained on blood work. Potential risks with medications discussed  Medication monitoring encounter - Plan: Vitamin B12, Vitamin D, 25-hydroxy, Magnesium  - as above as on PPI.   Meds ordered this encounter  Medications  . metFORMIN (GLUCOPHAGE) 1000 MG tablet    Sig: Take 1 tablet (1,000 mg total) by mouth 2 (two) times daily with a meal.    Dispense:  180 tablet    Refill:  1  . simvastatin (ZOCOR) 20 MG tablet    Sig: Take 1 tablet (20 mg total) by mouth every evening.    Dispense:  90 tablet    Refill:  1  . losartan-hydrochlorothiazide (HYZAAR) 100-25 MG tablet    Sig: Take 1 tablet by mouth daily.    Dispense:  90 tablet    Refill:  1  . sitaGLIPtin (JANUVIA) 100 MG tablet    Sig: Take 1 tablet (100 mg total) by mouth daily.    Dispense:  90 tablet    Refill:  1  . glimepiride (AMARYL) 2 MG tablet    Sig: Take 1.5 tablets (3 mg total) by mouth daily before breakfast.    Dispense:  135 tablet    Refill:  1  . esomeprazole (NEXIUM) 40 MG capsule    Sig: Take 1 capsule (40 mg total) by mouth daily.    Dispense:  90 capsule    Refill:  1   Patient Instructions   Monitor your blood pressures on the higher dose of HCTZ. If still remaining over 140/90, return to discuss other changes.  I will check some different electrolytes as the Nexium can cause low levels. Try one half pill per day to see if that controls  symptoms just as well.  Depending on diabetes level, may need to change medications. Either way, follow-up with me in 3  months for diabetes follow-up.   Keeping you healthy  Get these tests  Blood pressure- Have your blood pressure checked once a year by your healthcare provider.  Normal blood pressure is 120/80  Weight- Have your body mass  index (BMI) calculated to screen for obesity.  BMI is a measure of body fat based on height and weight. You can also calculate your own BMI at ViewBank[employer].si.  Cholesterol- Have your cholesterol checked every year.  Diabetes- Have your blood sugar checked regularly if you have high blood pressure, high cholesterol, have a family history of diabetes or if you are overweight.  Screening for Colon Cancer- Colonoscopy starting at age 64.  Screening may begin sooner depending on your family history and other health conditions. Follow up colonoscopy as directed by your Gastroenterologist.  Screening for Prostate Cancer- Both blood work (PSA) and a rectal exam help screen for Prostate Cancer.  Screening begins at age 70 with African-American men and at age 58 with Caucasian men.  Screening may begin sooner depending on your family history.  Take these medicines  Aspirin- One aspirin daily can help prevent Heart disease and Stroke.  Flu shot- Every fall.  Tetanus- Every 10 years.  Zostavax- Once after the age of 77 to prevent Shingles.  Pneumonia shot- Once after the age of 36; if you are younger than 20, ask your healthcare provider if you need a Pneumonia shot.  Take these steps  Don't smoke- If you do smoke, talk to your doctor about quitting.  For tips on how to quit, go to www.smokefree.gov or call 1-800-QUIT-NOW.  Be physically active- Exercise 5 days a week for at least 30 minutes.  If you are not already physically active start slow and gradually work up to 30 minutes of moderate physical activity.  Examples of moderate activity include walking briskly, mowing the yard, dancing, swimming, bicycling, etc.  Eat a healthy diet- Eat a variety of healthy  food such as fruits, vegetables, low fat milk, low fat cheese, yogurt, lean meant, poultry, fish, beans, tofu, etc. For more information go to www.thenutritionsource.org  Drink alcohol in moderation- Limit alcohol intake to less than two drinks a day. Never drink and drive.  Dentist- Brush and floss twice daily; visit your dentist twice a year.  Depression- Your emotional health is as important as your physical health. If you're feeling down, or losing interest in things you would normally enjoy please talk to your healthcare provider.  Eye exam- Visit your eye doctor every year.  Safe sex- If you may be exposed to a sexually transmitted infection, use a condom.  Seat belts- Seat belts can save your life; always wear one.  Smoke/Carbon Monoxide detectors- These detectors need to be installed on the appropriate level of your home.  Replace batteries at least once a year.  Skin cancer- When out in the sun, cover up and use sunscreen 15 SPF or higher.  Violence- If anyone is threatening you, please tell your healthcare provider.  Living Will/ Health care power of attorney- Speak with your healthcare provider and family. Diabetes Mellitus and Standards of Medical Care Managing diabetes (diabetes mellitus) can be complicated. Your diabetes treatment may be managed by a team of health care providers, including:  A diet and nutrition specialist (registered dietitian).  A nurse.  A certified diabetes educator (CDE).  A diabetes specialist (endocrinologist).  An eye doctor.  A primary care provider.  A dentist. Your health care providers follow a schedule in order to help you get the best quality of care. The following schedule is a general guideline for your diabetes management plan. Your health care providers may also give you more specific instructions. HbA1c ( hemoglobin A1c) test This test provides information about  blood sugar (glucose) control over the previous 2-3 months. It is  used to check whether your diabetes management plan needs to be adjusted.  If you are meeting your treatment goals, this test is done at least 2 times a year.  If you are not meeting treatment goals or if your treatment goals have changed, this test is done 4 times a year. Blood pressure test  This test is done at every routine medical visit. For most people, the goal is less than 140/90. In some cases, your goal blood pressure may be 130/80 or less. Ask your health care provider what your goal blood pressure should be. Dental and eye exams  Visit your dentist two times a year.  If you have type 1 diabetes, get an eye exam 3-5 years after you are diagnosed, and then once a year after your first exam.  If you were diagnosed with type 1 diabetes as a child, get an eye exam when you are age 57 or older and have had diabetes for 3-5 years. After the first exam, you should get an eye exam once a year.  If you have type 2 diabetes, have an eye exam as soon as you are diagnosed, and then once a year after your first exam. Foot care exam  Visual foot exams are done at every routine medical visit. The exams check for cuts, bruises, redness, blisters, sores, or other problems with the feet.  A complete foot exam is done by your health care provider once a year. This exam includes an inspection of the structure and skin of your feet, and a check of the pulses and sensation in your feet.  Type 1 diabetes: Get your first exam 3-5 years after diagnosis.  Type 2 diabetes: Get your first exam as soon as you are diagnosed.  Check your feet every day for cuts, bruises, redness, blisters, or sores. If you have any of these or other problems that are not healing, contact your health care provider. Kidney function test ( urine microalbumin)  This test is done once a year.  Type 1 diabetes: Get your first test 5 years after diagnosis.  Type 2 diabetes: Get your first test as soon as you are  diagnosed.  If you have chronic kidney disease (CKD), get a serum creatinine and estimated glomerular filtration rate (eGFR) test once a year. Lipid profile (cholesterol, HDL, LDL, triglycerides)  This test should be done when you are diagnosed with diabetes, and every 5 years after the first test. If you are on medicines to lower your cholesterol, you may need to get this test done every year.  The goal for LDL is less than 100 mg/dL (5.5 mmol/L). If you are at high risk, the goal is less than 70 mg/dL (3.9 mmol/L).  The goal for HDL is 40 mg/dL (2.2 mmol/L) for men and 50 mg/dL(2.8 mmol/L) for women. An HDL cholesterol of 60 mg/dL (3.3 mmol/L) or higher gives some protection against heart disease.  The goal for triglycerides is less than 150 mg/dL (8.3 mmol/L). Immunizations  The yearly flu (influenza) vaccine is recommended for everyone 6 months or older who has diabetes.  The pneumonia (pneumococcal) vaccine is recommended for everyone 2 years or older who has diabetes. If you are 34 or older, you may get the pneumonia vaccine as a series of two separate shots.  The hepatitis B vaccine is recommended for adults shortly after they have been diagnosed with diabetes.  The Tdap (tetanus, diphtheria, and  pertussis) vaccine should be given:  According to normal childhood vaccination schedules, for children.  Every 10 years, for adults who have diabetes.  The shingles vaccine is recommended for people who have had chicken pox and are 50 years or older. Mental and emotional health  Screening for symptoms of eating disorders, anxiety, and depression is recommended at the time of diagnosis and afterward as needed. If your screening shows that you have symptoms (you have a positive screening result), you may need further evaluation and be referred to a mental health care provider. Diabetes self-management education  Education about how to manage your diabetes is recommended at diagnosis and  ongoing as needed. Treatment plan  Your treatment plan will be reviewed at every medical visit. Summary  Managing diabetes (diabetes mellitus) can be complicated. Your diabetes treatment may be managed by a team of health care providers.  Your health care providers follow a schedule in order to help you get the best quality of care.  Standards of care including having regular physical exams, blood tests, blood pressure monitoring, immunizations, screening tests, and education about how to manage your diabetes.  Your health care providers may also give you more specific instructions based on your individual health. This information is not intended to replace advice given to you by your health care provider. Make sure you discuss any questions you have with your health care provider. Document Released: 06/20/2009 Document Revised: 05/21/2016 Document Reviewed: 05/21/2016 Elsevier Interactive Patient Education  2017 [employer]vier [employer].    IF you received an x-ray today, you will receive an invoice from [facility]. Please contact [facility] at [phone] with questions or concerns regarding your invoice.   IF you received labwork today, you will receive an invoice from [facility]. Please contact [facility] at [phone] with questions or concerns regarding your invoice.   Our billing staff will not be able to assist you with questions regarding bills from these companies.  You will be contacted with the lab results as soon as they are available. The fastest way to get your results is to activate your My Chart account. Instructions are located on the last page of this paperwork. If you have not heard from us regarding the results in 2 weeks, please contact this office.      I personally performed the services described in this documentation, which was scribed in my presence. The recorded information has been reviewed and considered for accuracy and completeness, addended by me  as needed, and agree with information above.  Signed,   [name], MD [facility].  11/03/16 4:44 PM   

## 2016-11-02 LAB — COMPREHENSIVE METABOLIC PANEL
A/G RATIO: 1.3 (ref 1.2–2.2)
ALK PHOS: 58 IU/L (ref 39–117)
ALT: 26 IU/L (ref 0–44)
AST: 24 IU/L (ref 0–40)
Albumin: 4 g/dL (ref 3.5–5.5)
BILIRUBIN TOTAL: 0.3 mg/dL (ref 0.0–1.2)
BUN/Creatinine Ratio: 11 (ref 9–20)
BUN: 11 mg/dL (ref 6–24)
CHLORIDE: 103 mmol/L (ref 96–106)
CO2: 22 mmol/L (ref 18–29)
Calcium: 9.4 mg/dL (ref 8.7–10.2)
Creatinine, Ser: 1 mg/dL (ref 0.76–1.27)
GFR calc non Af Amer: 86 mL/min/{1.73_m2} (ref >59)
GFR, EST AFRICAN AMERICAN: 99 mL/min/{1.73_m2} (ref >59)
Globulin, Total: 3.1 g/dL (ref 1.5–4.5)
Glucose: 134 mg/dL — ABNORMAL HIGH (ref 65–99)
POTASSIUM: 4.4 mmol/L (ref 3.5–5.2)
Sodium: 142 mmol/L (ref 134–144)
Total Protein: 7.1 g/dL (ref 6.0–8.5)

## 2016-11-02 LAB — MICROALBUMIN, URINE: Microalbumin, Urine: 34.1 ug/mL

## 2016-11-02 LAB — HEMOGLOBIN A1C
Est. average glucose Bld gHb Est-mCnc: 192 mg/dL
HEMOGLOBIN A1C: 8.3 % — AB (ref 4.8–5.6)

## 2016-11-02 LAB — LIPID PANEL
CHOLESTEROL TOTAL: 139 mg/dL (ref 100–199)
Chol/HDL Ratio: 3.2 ratio units (ref 0.0–5.0)
HDL: 44 mg/dL (ref >39)
LDL Calculated: 72 mg/dL (ref 0–99)
TRIGLYCERIDES: 115 mg/dL (ref 0–149)
VLDL Cholesterol Cal: 23 mg/dL (ref 5–40)

## 2016-11-02 LAB — MAGNESIUM: MAGNESIUM: 1.7 mg/dL (ref 1.6–2.3)

## 2016-11-02 LAB — VITAMIN D 25 HYDROXY (VIT D DEFICIENCY, FRACTURES): VIT D 25 HYDROXY: 29 ng/mL — AB (ref 30.0–100.0)

## 2016-11-02 LAB — VITAMIN B12: Vitamin B-12: 537 pg/mL (ref 232–1245)

## 2016-11-10 ENCOUNTER — Other Ambulatory Visit: Payer: Self-pay | Admitting: Family Medicine

## 2016-11-10 DIAGNOSIS — E119 Type 2 diabetes mellitus without complications: Secondary | ICD-10-CM

## 2016-11-10 MED ORDER — GLIMEPIRIDE 4 MG PO TABS: 4.0000 mg | ORAL_TABLET | Freq: Every day | ORAL | 3 refills | Status: DC

## 2017-02-02 ENCOUNTER — Ambulatory Visit (INDEPENDENT_AMBULATORY_CARE_PROVIDER_SITE_OTHER): Payer: Managed Care, Other (non HMO) | Admitting: Family Medicine

## 2017-02-02 ENCOUNTER — Encounter: Payer: Self-pay | Admitting: Family Medicine

## 2017-02-02 VITALS — BP 128/84 | HR 79 | Temp 98.2°F | Resp 16 | Ht 69.0 in | Wt 210.0 lb

## 2017-02-02 DIAGNOSIS — Z23 Encounter for immunization: Secondary | ICD-10-CM

## 2017-02-02 DIAGNOSIS — R809 Proteinuria, unspecified: Secondary | ICD-10-CM | POA: Diagnosis not present

## 2017-02-02 DIAGNOSIS — I10 Essential (primary) hypertension: Secondary | ICD-10-CM

## 2017-02-02 DIAGNOSIS — E1129 Type 2 diabetes mellitus with other diabetic kidney complication: Secondary | ICD-10-CM

## 2017-02-02 DIAGNOSIS — E785 Hyperlipidemia, unspecified: Secondary | ICD-10-CM | POA: Diagnosis not present

## 2017-02-02 DIAGNOSIS — M25561 Pain in right knee: Secondary | ICD-10-CM

## 2017-02-02 DIAGNOSIS — K219 Gastro-esophageal reflux disease without esophagitis: Secondary | ICD-10-CM | POA: Diagnosis not present

## 2017-02-02 DIAGNOSIS — E119 Type 2 diabetes mellitus without complications: Secondary | ICD-10-CM | POA: Diagnosis not present

## 2017-02-02 MED ORDER — SIMVASTATIN 20 MG PO TABS: 20.0000 mg | ORAL_TABLET | Freq: Every evening | ORAL | 1 refills | Status: DC

## 2017-02-02 MED ORDER — LOSARTAN POTASSIUM-HCTZ 100-25 MG PO TABS: 1.0000 | ORAL_TABLET | Freq: Every day | ORAL | 1 refills | Status: DC

## 2017-02-02 MED ORDER — ESOMEPRAZOLE MAGNESIUM 20 MG PO CPDR: 20.0000 mg | DELAYED_RELEASE_CAPSULE | Freq: Every day | ORAL | 1 refills | Status: DC

## 2017-02-02 MED ORDER — GLIMEPIRIDE 4 MG PO TABS: 4.0000 mg | ORAL_TABLET | Freq: Every day | ORAL | 1 refills | Status: DC

## 2017-02-02 MED ORDER — METFORMIN HCL 1000 MG PO TABS: 1000.0000 mg | ORAL_TABLET | Freq: Two times a day (BID) | ORAL | 1 refills | Status: DC

## 2017-02-02 MED ORDER — SITAGLIPTIN PHOSPHATE 100 MG PO TABS: 100.0000 mg | ORAL_TABLET | Freq: Every day | ORAL | 1 refills | Status: DC

## 2017-02-02 NOTE — Patient Instructions (Addendum)
  Blood pressure better on new dose of meds.   I will check diabetes test to see if other changes needed.   Tylenol if needed occasionally for knee pain, but if that is persistent - return for recheck and possible xrays.   Try the lower dose of Nexium to see if that controls heartburn just as well. If you have breakthrough heartburn symptoms on the 20mg  dose, then can return to 2 pills each day and let me know so I can send in the higher dose.  Recheck in 3 months   IF you received an x-ray today, you will receive an invoice from [facility]. Please contact [facility] at [phone] with questions or concerns regarding your invoice.   IF you received labwork today, you will receive an invoice from [facility]. Please contact [facility] at [phone] with questions or concerns regarding your invoice.   Our billing staff will not be able to assist you with questions regarding bills from these companies.  You will be contacted with the lab results as soon as they are available. The fastest way to get your results is to activate your My Chart account. Instructions are located on the last page of this paperwork. If you have not heard from [place] regarding the results in 2 weeks, please contact this office.

## 2017-02-02 NOTE — Progress Notes (Signed)
Subjective:  This chart was scribed for Wayne Agreste, MD by Wayne Perez, at [place] at [facility].  This patient was seen in room 3 and the patient's care was started at 11:04 AM.   Chief Complaint  Patient presents with  . Follow-up    3 month follow up. diabetes, hypertension  . Medication Refill    all except flomax     Patient ID: Wayne Perez, male    DOB: 1960/12/25, 56 y.o.   MRN: [id number]  HPI HPI Comments: Wayne Perez is a 56 y.o. male who presents to Primary Care at [facility] for a follow up (diabetes, hypertension) as well as a refill for her medications.Denies chest pains, SOB, abdominal pain, dark or tarry stools.  Patient would like to be updated on his pneumonia vaccination today.   He is complaining of some knee pain bilaterally.  Denies any recent injuries.  He will come in for another visit if this continues to bother him. Patient is no longer taking Viagra.  He would not like a refill for Flomax.    Hypertension: Hyzaar 100-25 mg QD  Lab Results  Component Value Date   CREATININE 1.00 11/01/2016    Hyperlipidemia: Simvastatin 20 mg QD--- Patient denies any new side effects.  Lab Results  Component Value Date   CHOL 139 11/01/2016   HDL 44 11/01/2016   LDLCALC 72 11/01/2016   TRIG 115 11/01/2016   CHOLHDL 3.2 11/01/2016    Lab Results  Component Value Date   ALT 26 11/01/2016   AST 24 11/01/2016   ALKPHOS 58 11/01/2016   BILITOT 0.3 11/01/2016      Diabetes: Patient is currently on Januvia 100 mg QD, metformin 100 mg BID,  Amaryl 4 mg BID: Was increased to 4 mg last visit due to elevated A1C.--- Patient will see his eye doctor on August 14th and his dentist August 24th. The last time he checked his blood sugar it was around  170.  Lab Results  Component Value Date   HGBA1C 8.3 (H) 11/01/2016   Lab Results  Component Value Date   MICROALBUR 5.7 (H) 03/03/2015    Blood pressure: HCTZ component was increased to 25 mg at last  visit for improved control.  --- Patient denies any side effects from the medication and feels that it works.  He states that he did not take it this morning as he thought he should not take it before an office visit but states that he has been complaint with it otherwise.  He checks his blood pressure weekly at the pharmacy - most recently 117/73.   GERD: See last visit. Dosing to symptoms as tolerated , B12 and magnesium were normal. Borderline low vitamin D. --- Patient is complaint with his GERD medication.  He has been compliant with his Nexium.     Patient Active Problem List   Diagnosis Date Noted  . Erectile dysfunction due to arterial insufficiency 08/07/2015  . Varicocele 08/07/2015  . Diabetes ([place]) 07/31/2012  . HTN (hypertension) 07/31/2012   Past Medical History:  Diagnosis Date  . Diabetes mellitus without complication ([place])   . GERD (gastroesophageal reflux disease)   . Hyperlipidemia   . Hypertension    Past Surgical History:  Procedure Laterality Date  . COLONOSCOPY     Allergies  Allergen Reactions  . Ace Inhibitors Itching   Prior to Admission medications   Medication Sig Start Date End Date Taking? Authorizing Provider  aspirin 81 MG tablet Take  81 mg by mouth daily.    [provider]  Blood Glucose Monitoring Suppl KIT One Touch Ultra or may substitute for what insurance covers.  May check once per day 09/09/14   Wayne Agreste, MD  esomeprazole (NEXIUM) 40 MG capsule Take 1 capsule (40 mg total) by mouth daily. 11/01/16   Wayne Agreste, MD  glimepiride (AMARYL) 4 MG tablet Take 1 tablet (4 mg total) by mouth daily with breakfast. 11/10/16   Wayne Agreste, MD  losartan-hydrochlorothiazide (HYZAAR) 100-25 MG tablet Take 1 tablet by mouth daily. 11/01/16   Wayne Agreste, MD  metFORMIN (GLUCOPHAGE) 1000 MG tablet Take 1 tablet (1,000 mg total) by mouth 2 (two) times daily with a meal. 11/01/16   Wayne Agreste, MD  simvastatin (ZOCOR) 20 MG  tablet Take 1 tablet (20 mg total) by mouth every evening. 11/01/16   Wayne Agreste, MD  sitaGLIPtin (JANUVIA) 100 MG tablet Take 1 tablet (100 mg total) by mouth daily. 11/01/16   Wayne Agreste, MD  tamsulosin (FLOMAX) 0.4 MG CAPS capsule Take 1 capsule (0.4 mg total) by mouth daily. 06/05/16   Timmothy Euler, MD   Social History   Social History  . Marital status: Married    Spouse name: N/A  . Number of children: 3  . Years of education: N/A   Occupational History  . Wayne Perez   Social History Main Topics  . Smoking status: Never Smoker  . Smokeless tobacco: Never Used  . Alcohol use No  . Drug use: No  . Sexual activity: Yes   Other Topics Concern  . Not on file   Social History Narrative   Married. Education: [employer]. Exercise: Walks twice a week for 1-2 hours.      Review of Systems  Constitutional: Negative for fatigue and unexpected weight change.  Eyes: Negative for visual disturbance.  Respiratory: Negative for cough, chest tightness and shortness of breath.   Cardiovascular: Negative for chest pain, palpitations and leg swelling.  Gastrointestinal: Negative for abdominal pain and blood in stool.  Neurological: Negative for dizziness, light-headedness and headaches.       Objective:   Physical Exam  Constitutional: He is oriented to person, place, and time. He appears well-developed and well-nourished.  HENT:  Head: Normocephalic and atraumatic.  Eyes: EOM are normal. Pupils are equal, round, and reactive to light.  Neck: No JVD present. Carotid bruit is not present.  Cardiovascular: Normal rate, regular rhythm and normal heart sounds.   No murmur heard. Pulmonary/Chest: Effort normal and breath sounds normal. He has no rales.  Musculoskeletal: He exhibits no edema.  Full ROM of the night knee, no appreciable swelling, no joint line tenderness.   Neurological: He is alert and oriented to person, place, and time.  Skin: Skin is warm and  dry.  Psychiatric: He has a normal mood and affect.  Vitals reviewed.  Vitals:   02/02/17 1016 02/02/17 1046  BP: (!) 143/89 128/84  Pulse: 79   Resp: 16   Temp: 98.2 F (36.8 C)   TempSrc: Oral   SpO2: 98%   Weight: 210 lb (95.3 kg)   Height: 5' 9"  (1.753 m)        Assessment & Plan:   Wayne Perez is a 56 y.o. male Type 2 diabetes mellitus with microalbuminuria, without long-term current use of insulin ([place]) - Plan: Hemoglobin A1c, Care order/instruction:glimepiride (AMARYL) 4 MG tablet, metFORMIN (GLUCOPHAGE) 1000 MG tablet, sitaGLIPtin (JANUVIA) 100  MG tablet  - Check A1c on new dose of Amaryl. Continue same dose of Amaryl, Januvia, metformin for now. Continue ARB for microalbuminuria.  Essential hypertension - Plan: Basic metabolic panel, losartan-hydrochlorothiazide (HYZAAR) 100-25 MG tablet  - Improved on higher dose of hydrochlorothiazide. Check BMP, continue same dose of Hyzaar  Hyperlipidemia, unspecified hyperlipidemia type - Plan: simvastatin (ZOCOR) 20 MG tablet  -Tolerating statin, continue same dose, with repeat lipid panel at next visit  Need for prophylactic vaccination against Streptococcus pneumoniae (pneumococcus)  -Pneumovax given with history of diabetes  Gastroesophageal reflux disease, esophagitis presence not specified - Plan: esomeprazole (NEXIUM) 20 MG capsule  - Trial of lower dose of Nexium at 20 mg daily. If symptoms return at lower dose, can restart 40 mg per day. Continue trigger avoidance.   Right knee pain, unspecified chronicity  - Intermittent, reassuring exam. Tylenol over-the-counter, return if persistent pain for further evaluation and possible imaging.  Meds ordered this encounter  Medications  . sildenafil (VIAGRA) 100 MG tablet    Sig: take 1/2 to 1 tablet by mouth once daily if needed as directed    Refill:  0  . esomeprazole (NEXIUM) 20 MG capsule    Sig: Take 1 capsule (20 mg total) by mouth daily at 12 noon.    Dispense:  90  capsule    Refill:  1  . glimepiride (AMARYL) 4 MG tablet    Sig: Take 1 tablet (4 mg total) by mouth daily with breakfast.    Dispense:  90 tablet    Refill:  1  . losartan-hydrochlorothiazide (HYZAAR) 100-25 MG tablet    Sig: Take 1 tablet by mouth daily.    Dispense:  90 tablet    Refill:  1  . metFORMIN (GLUCOPHAGE) 1000 MG tablet    Sig: Take 1 tablet (1,000 mg total) by mouth 2 (two) times daily with a meal.    Dispense:  180 tablet    Refill:  1  . simvastatin (ZOCOR) 20 MG tablet    Sig: Take 1 tablet (20 mg total) by mouth every evening.    Dispense:  90 tablet    Refill:  1  . sitaGLIPtin (JANUVIA) 100 MG tablet    Sig: Take 1 tablet (100 mg total) by mouth daily.    Dispense:  90 tablet    Refill:  1   Patient Instructions    Blood pressure better on new dose of meds.   I will check diabetes test to see if other changes needed.   Tylenol if needed occasionally for knee pain, but if that is persistent - return for recheck and possible xrays.   Try the lower dose of Nexium to see if that controls heartburn just as well. If you have breakthrough heartburn symptoms on the 50m dose, then can return to 2 pills each day and let me know so I can send in the higher dose.  Recheck in 3 months   IF you received an x-ray today, you will [facility]. Please contact [facility] at [phone] questions or concerns regarding your invoice.   IF you received labwork today, you will receive an invoice from [facility] Please contact [facility] at [phone] questions or concerns regarding your invoice.   Our billing staff will not be able to assist you with questions regarding bills from these companies.  You will be contacted with the lab results as soon as they are available. The fastest way to get your  results is to activate your My Chart account. Instructions are located on the last page of this paperwork. If you have not  heard from [place] regarding the results in 2 weeks, please contact this office.      I personally performed the services described in this documentation, which was scribed in my presence. The recorded information has been reviewed and considered for accuracy and completeness, addended by me as needed, and agree with information above.  Signed,   [name], MD Primary Care at [place].  02/02/17 2:15 PM

## 2017-02-03 ENCOUNTER — Ambulatory Visit: Payer: Managed Care, Other (non HMO) | Admitting: Family Medicine

## 2017-02-03 LAB — BASIC METABOLIC PANEL
BUN / CREAT RATIO: 12 (ref 9–20)
BUN: 13 mg/dL (ref 6–24)
CO2: 23 mmol/L (ref 18–29)
CREATININE: 1.09 mg/dL (ref 0.76–1.27)
Calcium: 9.6 mg/dL (ref 8.7–10.2)
Chloride: 98 mmol/L (ref 96–106)
GFR, EST AFRICAN AMERICAN: 89 mL/min/{1.73_m2} (ref >59)
GFR, EST NON AFRICAN AMERICAN: 77 mL/min/{1.73_m2} (ref >59)
Glucose: 169 mg/dL — ABNORMAL HIGH (ref 65–99)
Potassium: 4.2 mmol/L (ref 3.5–5.2)
Sodium: 136 mmol/L (ref 134–144)

## 2017-02-03 LAB — HEMOGLOBIN A1C
Est. average glucose Bld gHb Est-mCnc: 232 mg/dL
HEMOGLOBIN A1C: 9.7 % — AB (ref 4.8–5.6)

## 2017-02-21 ENCOUNTER — Encounter: Payer: Self-pay | Admitting: Family Medicine

## 2017-02-21 ENCOUNTER — Ambulatory Visit (INDEPENDENT_AMBULATORY_CARE_PROVIDER_SITE_OTHER): Payer: Managed Care, Other (non HMO) | Admitting: Family Medicine

## 2017-02-21 VITALS — BP 114/73 | HR 93 | Temp 98.0°F | Resp 18 | Ht 69.09 in | Wt 212.0 lb

## 2017-02-21 DIAGNOSIS — E1165 Type 2 diabetes mellitus with hyperglycemia: Secondary | ICD-10-CM | POA: Diagnosis not present

## 2017-02-21 DIAGNOSIS — K219 Gastro-esophageal reflux disease without esophagitis: Secondary | ICD-10-CM | POA: Diagnosis not present

## 2017-02-21 MED ORDER — GLIMEPIRIDE 2 MG PO TABS: 2.0000 mg | ORAL_TABLET | Freq: Every day | ORAL | 3 refills | Status: DC

## 2017-02-21 MED ORDER — ESOMEPRAZOLE MAGNESIUM 40 MG PO CPDR: 40.0000 mg | DELAYED_RELEASE_CAPSULE | Freq: Every day | ORAL | 3 refills | Status: DC

## 2017-02-21 MED ORDER — DULAGLUTIDE 0.75 MG/0.5ML ~~LOC~~ SOAJ: 0.7500 mg | SUBCUTANEOUS | 1 refills | Status: DC

## 2017-02-21 NOTE — Patient Instructions (Addendum)
Okay to return to 40 mg Nexium dose. I will refer you to gastroenterology to make sure no other testing or evaluation is required for the persistent heartburn.  For diabetes, increase glimepiride to 6 mg total dose per day (I sent in the 2 mg dose, continue 4 mg dose as well to achieve the total 6 mg per day).   Start Trulicity 1 injection per week. If that is not covered by your insurance, I can change to another similar medication. As we discussed with change in diabetes medications, risk of hypoglycemia or low blood sugars increases. See information and precautions on  this below, and if that does occur, return to discuss.  Otherwise return in 6 weeks with home readings.   Hypoglycemia Hypoglycemia occurs when the level of sugar (glucose) in the blood is too low. Glucose is a type of sugar that provides the body's main source of energy. Certain hormones (insulin and glucagon) control the level of glucose in the blood. Insulin lowers blood glucose, and glucagon increases blood glucose. Hypoglycemia can result from having too much insulin in the bloodstream, or from not eating enough food that contains glucose. Hypoglycemia can happen in people who do or do not have diabetes. It can develop quickly, and it can be a medical emergency. What are the causes? Hypoglycemia occurs most often in people who have diabetes. If you have diabetes, hypoglycemia may be caused by:  Diabetes medicine.  Not eating enough, or not eating often enough.  Increased physical activity.  Drinking alcohol, especially when you have not eaten recently.  If you do not have diabetes, hypoglycemia may be caused by:  A tumor in the pancreas. The pancreas is the organ that makes insulin.  Not eating enough, or not eating for long periods at a time (fasting).  Severe infection or illness that affects the liver, heart, or kidneys.  Certain medicines.  You may also have reactive hypoglycemia. This condition causes  hypoglycemia within 4 hours of eating a meal. This may occur after having stomach surgery. Sometimes, the cause of reactive hypoglycemia is not known. What increases the risk? Hypoglycemia is more likely to develop in:  People who have diabetes and take medicines to lower blood glucose.  People who abuse alcohol.  People who have a severe illness.  What are the signs or symptoms? Hypoglycemia may not cause any symptoms. If you have symptoms, they may include:  Hunger.  Anxiety.  Sweating and feeling clammy.  Confusion.  Dizziness or feeling light-headed.  Sleepiness.  Nausea.  Increased heart rate.  Headache.  Blurry vision.  Seizure.  Nightmares.  Tingling or numbness around the mouth, lips, or tongue.  A change in speech.  Decreased ability to concentrate.  A change in coordination.  Restless sleep.  Tremors or shakes.  Fainting.  Irritability.  How is this diagnosed? Hypoglycemia is diagnosed with a blood test to measure your blood glucose level. This blood test is done while you are having symptoms. Your health care provider may also do a physical exam and review your medical history. If you do not have diabetes, other tests may be done to find the cause of your hypoglycemia. How is this treated? This condition can often be treated by immediately eating or drinking something that contains glucose, such as:  3-4 sugar tablets (glucose pills).  Glucose gel, 15-gram tube.  Fruit juice, 4 oz (120 mL).  Regular soda (not diet soda), 4 oz (120 mL).  Low-fat milk, 4 oz (120 mL).  Several pieces of hard candy.  Sugar or honey, 1 Tbsp.  Treating Hypoglycemia If You Have Diabetes  If you are alert and able to swallow safely, follow the 15:15 rule:  Take 15 grams of a rapid-acting carbohydrate. Rapid-acting options include: ? 1 tube of glucose gel. ? 3 glucose pills. ? 6-8 pieces of hard candy. ? 4 oz (120 mL) of fruit juice. ? 4 oz (120 ml)  of regular (not diet) soda.  Check your blood glucose 15 minutes after you take the carbohydrate.  If the repeat blood glucose level is still at or below 70 mg/dL (3.9 mmol/L), take 15 grams of a carbohydrate again.  If your blood glucose level does not increase above 70 mg/dL (3.9 mmol/L) after 3 tries, seek emergency medical care.  After your blood glucose level returns to normal, eat a meal or a snack within 1 hour.  Treating Severe Hypoglycemia Severe hypoglycemia is when your blood glucose level is at or below 54 mg/dL (3 mmol/L). Severe hypoglycemia is an emergency. Do not wait to see if the symptoms will go away. Get medical help right away. Call your local emergency services (911 in the [place] Do not drive yourself to the hospital. If you have severe hypoglycemia and you cannot eat or drink, you may need an injection of glucagon. A family member or close friend should learn how to check your blood glucose and how to give you a glucagon injection. Ask your health care provider if you need to have an emergency glucagon injection kit available. Severe hypoglycemia may need to be treated in a hospital. The treatment may include getting glucose through an IV tube. You may also need treatment for the cause of your hypoglycemia. Follow these instructions at home: General instructions  Avoid any diets that cause you to not eat enough food. Talk with your health care provider before you start any new diet.  Take over-the-counter and prescription medicines only as told by your health care provider.  Limit alcohol intake to no more than 1 drink per day for nonpregnant women and 2 drinks per day for men. One drink equals 12 oz of beer, 5 oz of wine, or 1 oz of hard liquor.  Keep all follow-up visits as told by your health care provider. This is important. If You Have Diabetes:   Make sure you know the symptoms of hypoglycemia.  Always have a rapid-acting carbohydrate snack with you to treat  low blood sugar.  Follow your diabetes management plan, as told by your health care provider. Make sure you: ? Take your medicines as directed. ? Follow your exercise plan. ? Follow your meal plan. Eat on time, and do not skip meals. ? Check your blood glucose as often as directed. Make sure to check your blood glucose before and after exercise. If you exercise longer or in a different way than usual, check your blood glucose more often. ? Follow your sick day plan whenever you cannot eat or drink normally. Make this plan in advance with your health care provider.  Share your diabetes management plan with people in your workplace, school, and household.  Check your urine for ketones when you are ill and as told by your health care provider.  Carry a medical alert card or wear medical alert jewelry. If You Have Reactive Hypoglycemia or Low Blood Sugar From Other Causes:  Monitor your blood glucose as told by your health care provider.  Follow instructions from your health care provider about  eating or drinking restrictions. Contact a health care provider if:  You have problems keeping your blood glucose in your target range.  You have frequent episodes of hypoglycemia. Get help right away if:  You continue to have hypoglycemia symptoms after eating or drinking something containing glucose.  Your blood glucose is at or below 54 mg/dL (3 mmol/L).  You have a seizure.  You faint. These symptoms may represent a serious problem that is an emergency. Do not wait to see if the symptoms will go away. Get medical help right away. Call your local emergency services (911 in the [place] Do not drive yourself to the hospital. This information is not intended to replace advice given to you by your health care provider. Make sure you discuss any questions you have with your health care provider. Document Released: 08/23/2005 Document Revised: 02/04/2016 Document Reviewed: 09/26/2015 Elsevier  Interactive Patient Education  2018 [employer].     IF you received an x-ray today, you will receive an invoice from [facility]. Please contact [facility] at [phone] with questions or concerns regarding your invoice.   IF you received labwork today, you will receive an invoice from [place]. Please contact [facility] at [phone] with questions or concerns regarding your invoice.   Our billing staff will not be able to assist you with questions regarding bills from these companies.  You will be contacted with the lab results as soon as they are available. The fastest way to get your results is to activate your My Chart account. Instructions are located on the last page of this paperwork. If you have not heard from [place] regarding the results in 2 weeks, please contact this office.

## 2017-02-21 NOTE — Progress Notes (Addendum)
Subjective:  By signing my name below, I, Wayne Perez, attest that this documentation has been prepared under the direction and in the presence of Wayne Agreste, MD Electronically Signed: Ladene Artist, ED Scribe 02/21/2017 at 5:14 PM.   Patient ID: Wayne Perez, male    DOB: 1961-09-06, 56 y.o.   MRN: [id number]  Chief Complaint  Patient presents with  . Medication Management    possible change in DM medication and go over some other medication he has questions about   HPI Wayne Perez is a 56 y.o. male who presents to Primary Care at [facility] to discuss additional medication.   Type 2 Diabetes Lab Results  Component Value Date   HGBA1C 9.7 (H) 02/02/2017  Apparently he is on Amaryl 4 mg qd, Metformin 1000 mg bid and Januvia 100 mg qd. His A1C had increased from February at 8.3. Increased Amaryl at that visit in February to 4 mg qd. Pt expresses his frustration, stating he is currently exercising 4 days/week and has only missed 1 dose of his medication. He states that he does not eat fast food at all and does not even recall the last time he visited a fast food restaurant. Lowest home CBG was 179. Pt denies h/o pancreatitis, family h/o thyroid or endocrine CA.  Wt Readings from Last 3 Encounters:  02/21/17 212 lb (96.2 kg)  02/02/17 210 lb (95.3 kg)  11/01/16 209 lb (94.8 kg)   GERD Pt has been taking Nexium 20 mg daily but is still having breakthrough heartburn. States heartburn resolves with taking 40 mg. He has never had an endoscopy done. Denies weight loss, night seats, blood in stools, melena, vomiting, choking, difficulty swallowing.   Patient Active Problem List   Diagnosis Date Noted  . Erectile dysfunction due to arterial insufficiency 08/07/2015  . Varicocele 08/07/2015  . Diabetes ([place]) 07/31/2012  . HTN (hypertension) 07/31/2012   Past Medical History:  Diagnosis Date  . Diabetes mellitus without complication ([place])   . GERD (gastroesophageal reflux disease)     . Hyperlipidemia   . Hypertension    Past Surgical History:  Procedure Laterality Date  . COLONOSCOPY     Allergies  Allergen Reactions  . Ace Inhibitors Itching   Prior to Admission medications   Medication Sig Start Date End Date Taking? Authorizing Provider  aspirin 81 MG tablet Take 81 mg by mouth daily.   Yes [provider]  Blood Glucose Monitoring Suppl KIT One Touch Ultra or may substitute for what insurance covers.  May check once per day 09/09/14  Yes Wayne Agreste, MD  esomeprazole (NEXIUM) 20 MG capsule Take 1 capsule (20 mg total) by mouth daily at 12 noon. 02/02/17  Yes Wayne Agreste, MD  glimepiride (AMARYL) 4 MG tablet Take 1 tablet (4 mg total) by mouth daily with breakfast. 02/02/17  Yes Wayne Agreste, MD  losartan-hydrochlorothiazide (HYZAAR) 100-25 MG tablet Take 1 tablet by mouth daily. 02/02/17  Yes Wayne Agreste, MD  metFORMIN (GLUCOPHAGE) 1000 MG tablet Take 1 tablet (1,000 mg total) by mouth 2 (two) times daily with a meal. 02/02/17  Yes Wayne Agreste, MD  sildenafil (VIAGRA) 100 MG tablet take 1/2 to 1 tablet by mouth once daily if needed as directed 01/03/17  Yes [provider]  simvastatin (ZOCOR) 20 MG tablet Take 1 tablet (20 mg total) by mouth every evening. 02/02/17  Yes Wayne Agreste, MD  sitaGLIPtin (JANUVIA) 100 MG tablet Take 1 tablet (100  mg total) by mouth daily. 02/02/17  Yes Wayne Agreste, MD  tamsulosin (FLOMAX) 0.4 MG CAPS capsule Take 1 capsule (0.4 mg total) by mouth daily. 06/05/16  Yes Timmothy Euler, MD   Social History   Social History  . Marital status: Married    Spouse name: N/A  . Number of children: 3  . Years of education: N/A   Occupational History  . [employer]   Social History Main Topics  . Smoking status: Never Smoker  . Smokeless tobacco: Never Used  . Alcohol use No  . Drug use: No  . Sexual activity: Yes   Other Topics Concern  . Not on file   Social History  Narrative   Married. Education: [employer]. Exercise: Walks twice a week for 1-2 hours.   Review of Systems  Constitutional: Negative for diaphoresis and unexpected weight change.  HENT: Negative for trouble swallowing.   Respiratory: Negative for choking.   Gastrointestinal: Negative for blood in stool and vomiting.      Objective:   Physical Exam  Constitutional: He is oriented to person, place, and time. He appears well-developed and well-nourished.  HENT:  Head: Normocephalic and atraumatic.  Eyes: EOM are normal. Pupils are equal, round, and reactive to light.  Neck: No JVD present. Carotid bruit is not present.  Cardiovascular: Normal rate, regular rhythm and normal heart sounds.   No murmur heard. Pulmonary/Chest: Effort normal and breath sounds normal. He has no rales.  Musculoskeletal: He exhibits no edema.  Neurological: He is alert and oriented to person, place, and time.  Skin: Skin is warm and dry.  Psychiatric: He has a normal mood and affect.  Vitals reviewed.  Vitals:   02/21/17 1633  BP: 114/73  Pulse: 93  Resp: 18  Temp: 98 F (36.7 C)  TempSrc: Oral  SpO2: 98%  Weight: 212 lb (96.2 kg)  Height: 5' 9.09" (1.755 m)   Teaching of Trulicity injection was performed with injection pen.     Assessment & Plan:    Wayne Perez is a 56 y.o. male Type 2 diabetes mellitus with hyperglycemia, without long-term current use of insulin ([place]) - Plan: Dulaglutide (TRULICITY) 2.09 OB/0.9GG SOPN  - Controlled on current 3 med regimen. Options discussed including long-acting insulin, but would like to try Trulicity initially. Teaching discussed in office. Start at 0.75 mg every 7 days, potential side effects discussed. Will also increase Amaryl by 2 mg for total dose of 6 mg daily. Continue metformin at current doses. Recheck within 6 weeks to 3 months. Hypoglycemic precautions discussed.  Gastroesophageal reflux disease, esophagitis presence not specified - Plan: Ambulatory  referral to Gastroenterology  - Did not tolerate lower dose Nexium. Restart 40 mg daily, referred to gastroenterology due to persistent high dose PPI need.  Meds ordered this encounter  Medications  . Dulaglutide (TRULICITY) 8.36 OQ/9.4TM SOPN    Sig: Inject 0.75 mg into the skin every 7 (seven) days.    Dispense:  6 mL    Refill:  1  . glimepiride (AMARYL) 2 MG tablet    Sig: Take 1 tablet (2 mg total) by mouth daily before breakfast.    Dispense:  30 tablet    Refill:  3  . esomeprazole (NEXIUM) 40 MG capsule    Sig: Take 1 capsule (40 mg total) by mouth daily.    Dispense:  30 capsule    Refill:  3   Patient Instructions   Okay to return to 40 mg  Nexium dose. I will refer you to gastroenterology to make sure no other testing or evaluation is required for the persistent heartburn.  For diabetes, increase glimepiride to 6 mg total dose per day (I sent in the 2 mg dose, continue 4 mg dose as well to achieve the total 6 mg per day).   Start Trulicity 1 injection per week. If that is not covered by your insurance, I can change to another similar medication. As we discussed with change in diabetes medications, risk of hypoglycemia or low blood sugars increases. See information and precautions on  this below, and if that does occur, return to discuss.  Otherwise return in 6 weeks with home readings.   Hypoglycemia Hypoglycemia occurs when the level of sugar (glucose) in the blood is too low. Glucose is a type of sugar that provides the body's main source of energy. Certain hormones (insulin and glucagon) control the level of glucose in the blood. Insulin lowers blood glucose, and glucagon increases blood glucose. Hypoglycemia can result from having too much insulin in the bloodstream, or from not eating enough food that contains glucose. Hypoglycemia can happen in people who do or do not have diabetes. It can develop quickly, and it can be a medical emergency. What are the  causes? Hypoglycemia occurs most often in people who have diabetes. If you have diabetes, hypoglycemia may be caused by:  Diabetes medicine.  Not eating enough, or not eating often enough.  Increased physical activity.  Drinking alcohol, especially when you have not eaten recently.  If you do not have diabetes, hypoglycemia may be caused by:  A tumor in the pancreas. The pancreas is the organ that makes insulin.  Not eating enough, or not eating for long periods at a time (fasting).  Severe infection or illness that affects the liver, heart, or kidneys.  Certain medicines.  You may also have reactive hypoglycemia. This condition causes hypoglycemia within 4 hours of eating a meal. This may occur after having stomach surgery. Sometimes, the cause of reactive hypoglycemia is not known. What increases the risk? Hypoglycemia is more likely to develop in:  People who have diabetes and take medicines to lower blood glucose.  People who abuse alcohol.  People who have a severe illness.  What are the signs or symptoms? Hypoglycemia may not cause any symptoms. If you have symptoms, they may include:  Hunger.  Anxiety.  Sweating and feeling clammy.  Confusion.  Dizziness or feeling light-headed.  Sleepiness.  Nausea.  Increased heart rate.  Headache.  Blurry vision.  Seizure.  Nightmares.  Tingling or numbness around the mouth, lips, or tongue.  A change in speech.  Decreased ability to concentrate.  A change in coordination.  Restless sleep.  Tremors or shakes.  Fainting.  Irritability.  How is this diagnosed? Hypoglycemia is diagnosed with a blood test to measure your blood glucose level. This blood test is done while you are having symptoms. Your health care provider may also do a physical exam and review your medical history. If you do not have diabetes, other tests may be done to find the cause of your hypoglycemia. How is this treated? This  condition can often be treated by immediately eating or drinking something that contains glucose, such as:  3-4 sugar tablets (glucose pills).  Glucose gel, 15-gram tube.  Fruit juice, 4 oz (120 mL).  Regular soda (not diet soda), 4 oz (120 mL).  Low-fat milk, 4 oz (120 mL).  Several pieces of hard  candy.  Sugar or honey, 1 Tbsp.  Treating Hypoglycemia If You Have Diabetes  If you are alert and able to swallow safely, follow the 15:15 rule:  Take 15 grams of a rapid-acting carbohydrate. Rapid-acting options include: ? 1 tube of glucose gel. ? 3 glucose pills. ? 6-8 pieces of hard candy. ? 4 oz (120 mL) of fruit juice. ? 4 oz (120 ml) of regular (not diet) soda.  Check your blood glucose 15 minutes after you take the carbohydrate.  If the repeat blood glucose level is still at or below 70 mg/dL (3.9 mmol/L), take 15 grams of a carbohydrate again.  If your blood glucose level does not increase above 70 mg/dL (3.9 mmol/L) after 3 tries, seek emergency medical care.  After your blood glucose level returns to normal, eat a meal or a snack within 1 hour.  Treating Severe Hypoglycemia Severe hypoglycemia is when your blood glucose level is at or below 54 mg/dL (3 mmol/L). Severe hypoglycemia is an emergency. Do not wait to see if the symptoms will go away. Get medical help right away. Call your local emergency services (911 in the [place] Do not drive yourself to the hospital. If you have severe hypoglycemia and you cannot eat or drink, you may need an injection of glucagon. A family member or close friend should learn how to check your blood glucose and how to give you a glucagon injection. Ask your health care provider if you need to have an emergency glucagon injection kit available. Severe hypoglycemia may need to be treated in a hospital. The treatment may include getting glucose through an IV tube. You may also need treatment for the cause of your hypoglycemia. Follow these  instructions at home: General instructions  Avoid any diets that cause you to not eat enough food. Talk with your health care provider before you start any new diet.  Take over-the-counter and prescription medicines only as told by your health care provider.  Limit alcohol intake to no more than 1 drink per day for nonpregnant women and 2 drinks per day for men. One drink equals 12 oz of beer, 5 oz of wine, or 1 oz of hard liquor.  Keep all follow-up visits as told by your health care provider. This is important. If You Have Diabetes:   Make sure you know the symptoms of hypoglycemia.  Always have a rapid-acting carbohydrate snack with you to treat low blood sugar.  Follow your diabetes management plan, as told by your health care provider. Make sure you: ? Take your medicines as directed. ? Follow your exercise plan. ? Follow your meal plan. Eat on time, and do not skip meals. ? Check your blood glucose as often as directed. Make sure to check your blood glucose before and after exercise. If you exercise longer or in a different way than usual, check your blood glucose more often. ? Follow your sick day plan whenever you cannot eat or drink normally. Make this plan in advance with your health care provider.  Share your diabetes management plan with people in your workplace, school, and household.  Check your urine for ketones when you are ill and as told by your health care provider.  Carry a medical alert card or wear medical alert jewelry. If You Have Reactive Hypoglycemia or Low Blood Sugar From Other Causes:  Monitor your blood glucose as told by your health care provider.  Follow instructions from your health care provider about eating or drinking restrictions. Contact  a health care provider if:  You have problems keeping your blood glucose in your target range.  You have frequent episodes of hypoglycemia. Get help right away if:  You continue to have hypoglycemia  symptoms after eating or drinking something containing glucose.  Your blood glucose is at or below 54 mg/dL (3 mmol/L).  You have a seizure.  You faint. These symptoms may represent a serious problem that is an emergency. Do not wait to see if the symptoms will go away. Get medical help right away. Call your local emergency services (911 in the [place] Do not drive yourself to the hospital. This information is not intended to replace advice given to you by your health care provider. Make sure you discuss any questions you have with your health care provider. Document Released: 08/23/2005 Document Revised: 02/04/2016 Document Reviewed: 09/26/2015 Elsevier Interactive Patient Education  2018 [employer].     IF you received an x-ray today, you will receive an invoice from [facility]. Please contact [facility] at [phone] with questions or concerns regarding your invoice.   IF you received labwork today, you will receive an invoice from [place]. Please contact [facility] at [phone] with questions or concerns regarding your invoice.   Our billing staff will not be able to assist you with questions regarding bills from these companies.  You will be contacted with the lab results as soon as they are available. The fastest way to get your results is to activate your My Chart account. Instructions are located on the last page of this paperwork. If you have not heard from [place] regarding the results in 2 weeks, please contact this office.       I personally performed the services described in this documentation, which was scribed in my presence. The recorded information has been reviewed and considered for accuracy and completeness, addended by me as needed, and agree with information above.  Signed,   [name], MD Primary [facility] at [facility].  02/23/17 2:57 PM

## 2017-03-16 IMAGING — CR DG CHEST 1V
2 series · 2 of 2 positions shown · non-contrast
Comparison: 05/27/2015.

CLINICAL DATA: Positive TB test.

EXAM:
CHEST 1 VIEW

[PA (1 of 2)]
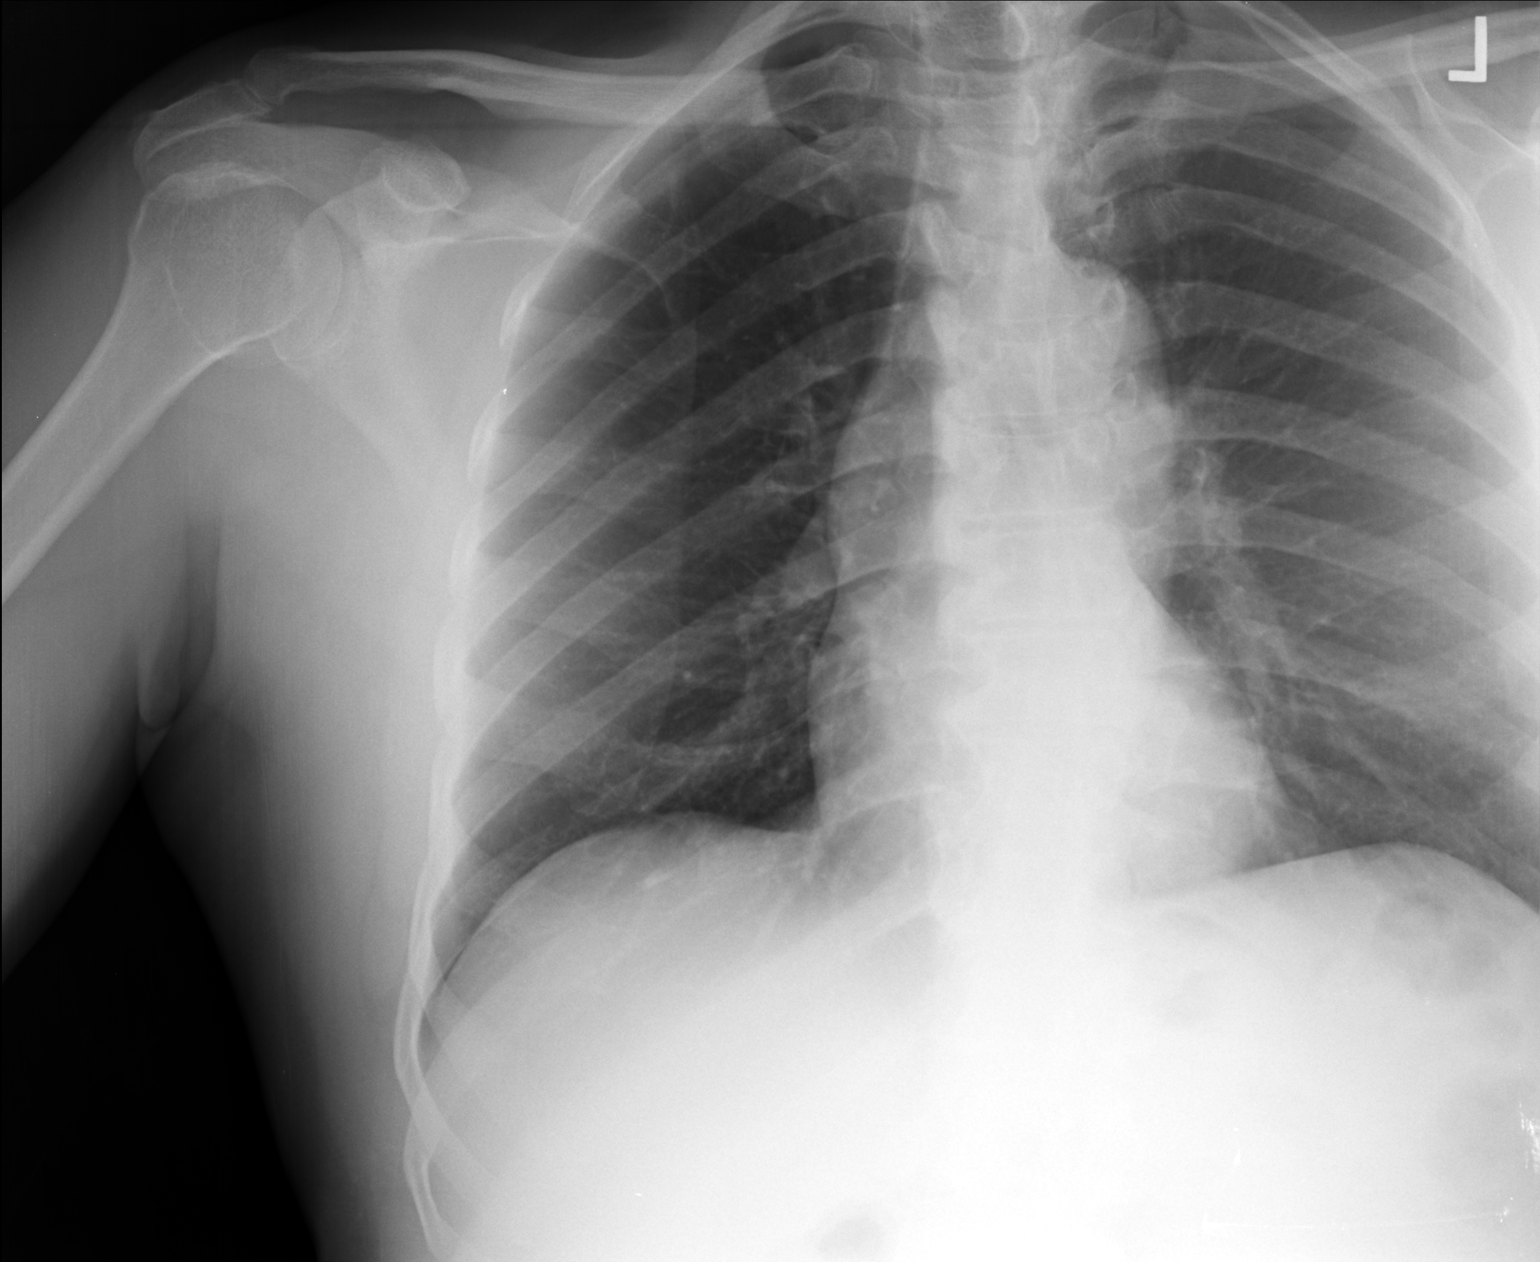

[PA (2 of 2)]
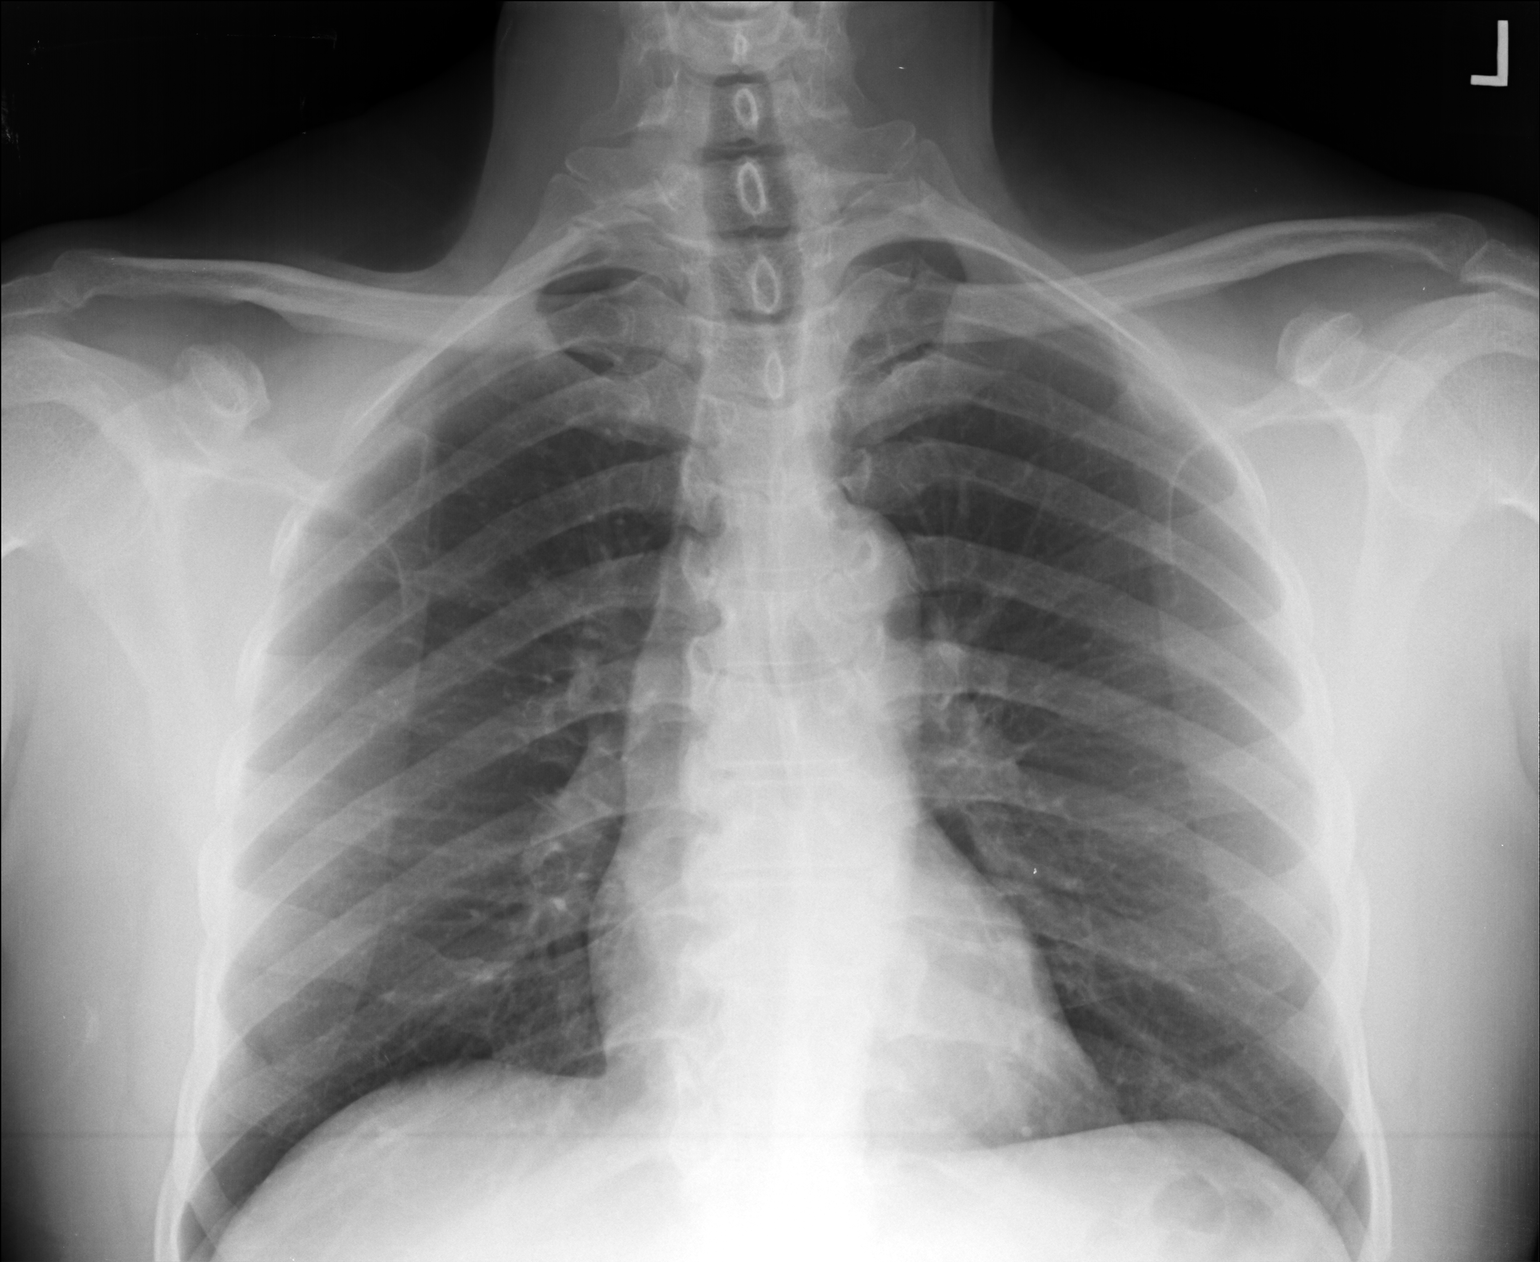

[2 of 2 positions shown; findings below may reference images not displayed]

FINDINGS: Mediastinum and hilar structures normal. Low lung volumes with mild
bibasilar atelectasis. Heart size normal. No pleural effusion
pneumothorax.
IMPRESSION: No acute cardiopulmonary disease. Mild bibasilar atelectasis and or
infiltrate.

## 2017-03-26 ENCOUNTER — Telehealth: Payer: Self-pay

## 2017-03-26 NOTE — Telephone Encounter (Signed)
I called the patient to verify if he is using the Acc-check aviva plus test strip, I left a message his vm to call us back [phone]

## 2017-04-07 ENCOUNTER — Ambulatory Visit (INDEPENDENT_AMBULATORY_CARE_PROVIDER_SITE_OTHER): Payer: Managed Care, Other (non HMO) | Admitting: Family Medicine

## 2017-04-07 ENCOUNTER — Encounter: Payer: Self-pay | Admitting: Family Medicine

## 2017-04-07 VITALS — BP 102/66 | HR 87 | Temp 98.0°F | Resp 18 | Ht 69.0 in | Wt 208.8 lb

## 2017-04-07 DIAGNOSIS — K219 Gastro-esophageal reflux disease without esophagitis: Secondary | ICD-10-CM

## 2017-04-07 DIAGNOSIS — E1165 Type 2 diabetes mellitus with hyperglycemia: Secondary | ICD-10-CM

## 2017-04-07 MED ORDER — GLUCOSE BLOOD VI STRP: ORAL_STRIP | 6 refills | Status: DC

## 2017-04-07 NOTE — Progress Notes (Signed)
By signing my name below, I, Wayne Perez, attest that this documentation has been prepared under the direction and in the presence of Wayne Ray, MD.  Electronically Signed: Verlee Perez, Medical Scribe. 04/07/17. 5:38 PM.  Subjective:    Patient ID: Wayne Perez, male    DOB: 19-Sep-1960, 56 y.o.   MRN: [id number]  HPI Chief Complaint  Patient presents with  . Hypertension    6 week f/u  . Diabetes    6 week f/u    HPI Comments: Wayne Perez is a 56 y.o. male who presents to Primary Care at [facility] for follow-up.  DM: Was last seen June 54TG, started on trulicity. Increased amaryl to 6 mg Qd, continued metformin same dose. Here to review home readings on new medications. Pt is compliant with his medications. He takes 2.56 of trulicity and he takes amaryl in the morning. Pt doesn't check his blood sugar since he ran out of test strips. For the first 4 weeks he experienced diarrhea as well as light-headedness and diaphoresis after exercising after work at times. These sxs have completely resolved. He would find relief with food after exercising. Pt eats in the morning, 11-12pm, and again at 8:30p-9p night.  Lab Results  Component Value Date   HGBA1C 9.7 (H) 02/02/2017   Lab Results  Component Value Date   MICROALBUR 5.7 (H) 03/03/2015   Wt Readings from Last 3 Encounters:  04/07/17 208 lb 12.8 oz (94.7 kg)  02/21/17 212 lb (96.2 kg)  02/02/17 210 lb (95.3 kg)   GERD: Nexium was continued at last visit at 40 mg and he was refrred to GI.  Pt is complaint with nexium and finds reflief to reflux, but he still has bleching. Pt has an appt with his GI in late Aug. Denies N/V.  Patient Active Problem List   Diagnosis Date Noted  . Erectile dysfunction due to arterial insufficiency 08/07/2015  . Varicocele 08/07/2015  . Diabetes ([place]) 07/31/2012  . HTN (hypertension) 07/31/2012   Past Medical History:  Diagnosis Date  . Diabetes mellitus without complication ([place])   .  GERD (gastroesophageal reflux disease)   . Hyperlipidemia   . Hypertension    Past Surgical History:  Procedure Laterality Date  . COLONOSCOPY     Allergies  Allergen Reactions  . Ace Inhibitors Itching   Prior to Admission medications   Medication Sig Start Date End Date Taking? Authorizing Provider  aspirin 81 MG tablet Take 81 mg by mouth daily.   Yes [provider]  Blood Glucose Monitoring Suppl KIT One Touch Ultra or may substitute for what insurance covers.  May check once per day 09/09/14  Yes [name], MD  Dulaglutide (TRULICITY) 3.89 HT/3.4KA SOPN Inject 0.75 mg into the skin every 7 (seven) days. 02/21/17  Yes [name], MD  esomeprazole (NEXIUM) 40 MG capsule Take 1 capsule (40 mg total) by mouth daily. 02/21/17  Yes [name], MD  glimepiride (AMARYL) 2 MG tablet Take 1 tablet (2 mg total) by mouth daily before breakfast. 02/21/17  Yes [name], MD  glimepiride (AMARYL) 4 MG tablet Take 1 tablet (4 mg total) by mouth daily with breakfast. 02/02/17  Yes [name], MD  losartan-hydrochlorothiazide (HYZAAR) 100-25 MG tablet Take 1 tablet by mouth daily. 02/02/17  Yes [name], MD  metFORMIN (GLUCOPHAGE) 1000 MG tablet Take 1 tablet (1,000 mg total) by mouth 2 (two) times daily with a meal. 02/02/17  Yes Wayne Perez  R, MD  sildenafil (VIAGRA) 100 MG tablet take 1/2 to 1 tablet by mouth once daily if needed as directed 01/03/17  Yes [provider]  simvastatin (ZOCOR) 20 MG tablet Take 1 tablet (20 mg total) by mouth every evening. 02/02/17  Yes [name], MD  sitaGLIPtin (JANUVIA) 100 MG tablet Take 1 tablet (100 mg total) by mouth daily. 02/02/17  Yes [name], MD  tamsulosin (FLOMAX) 0.4 MG CAPS capsule Take 1 capsule (0.4 mg total) by mouth daily. 06/05/16  Yes Timmothy Euler, MD   Social History   Social History  . Marital status: Married    Spouse name: N/A  . Number of children: 3  .  Years of education: N/A   Occupational History  . Wayne Perez   Social History Main Topics  . Smoking status: Never Smoker  . Smokeless tobacco: Never Used  . Alcohol use No  . Drug use: No  . Sexual activity: Yes   Other Topics Concern  . Not on file   Social History Narrative   Married. Education: [employer]. Exercise: Walks twice a week for 1-2 hours.   Review of Systems  Constitutional: Negative for diaphoresis.  Cardiovascular: Negative for chest pain (from GERD).  Gastrointestinal: Positive for abdominal distention. Negative for diarrhea, nausea and vomiting.  Neurological: Negative for light-headedness.   Objective:  Physical Exam  Constitutional: He appears well-developed and well-nourished. No distress.  HENT:  Head: Normocephalic and atraumatic.  Eyes: Conjunctivae are normal.  Neck: Neck supple.  Cardiovascular: Normal rate, regular rhythm and normal heart sounds.  Exam reveals no gallop and no friction rub.   No murmur heard. Pulmonary/Chest: Effort normal and breath sounds normal. No respiratory distress. He has no wheezes. He has no rales.  Neurological: He is alert.  Skin: Skin is warm and dry.  Psychiatric: He has a normal mood and affect. His behavior is normal.  Nursing note and vitals reviewed.   Vitals:   04/07/17 1646  BP: 102/66  Pulse: 87  Resp: 18  Temp: 98 F (36.7 C)  TempSrc: Oral  SpO2: 98%  Weight: 208 lb 12.8 oz (94.7 kg)  Height: 5' 9" (1.753 m)  Body mass index is 30.83 kg/m. Assessment & Plan:    Wayne Perez is a 56 y.o. male Gastroesophageal reflux disease, esophagitis presence not specified  - Stable/improved. Episodic belching initially may have been in part due to Trulicity. Continue PPI daily, follow-up with Gastroenterology as planned.  Type 2 diabetes mellitus with hyperglycemia, without long-term current use of insulin (Wayne Perez) - Plan: glucose blood test strip  - Some initial reported possible hypoglycemia after  exercising, but the symptoms have resolved. Tolerating current regimen of glimepiride 6 mg total at breakfast, metformin 1032m BID, and new addition of Trulicity.   - Testing strips refilled, check home readings at various times of the day, and follow-up in 4-6 weeks for repeat A1c, fasting for lipid panel that time  - Hypoglycemic precautions and handout given. Depending on readings, could consider splitting glimepiride to breakfast and dinner dosing.   Meds ordered this encounter  Medications  . glucose blood test strip    Sig: accu check aviva plus - check BID (uncontrolled diabetes, on sulfonyurea with reported hypoglycemia).    Dispense:  100 each    Refill:  6   Patient Instructions    No change in dose of medications for now. Check your blood sugar 1-2 times per day, but keep a record  of those readings to bring to visit in 4-6 weeks. You can alternate the times of those readings, including fasting, 2 hours after meals, or at bedtime. Additionally any time you have any symptoms of low blood sugar, check your blood sugar right away. I have included information on low blood sugar symptoms below. If you notice low blood sugar readings occurring, let me know.   Hypoglycemia Hypoglycemia occurs when the level of sugar (glucose) in the blood is too low. Glucose is a type of sugar that provides the body's main source of energy. Certain hormones (insulin and glucagon) control the level of glucose in the blood. Insulin lowers blood glucose, and glucagon increases blood glucose. Hypoglycemia can result from having too much insulin in the bloodstream, or from not eating enough food that contains glucose. Hypoglycemia can happen in people who do or do not have diabetes. It can develop quickly, and it can be a medical emergency. What are the causes? Hypoglycemia occurs most often in people who have diabetes. If you have diabetes, hypoglycemia may be caused by:  Diabetes medicine.  Not eating  enough, or not eating often enough.  Increased physical activity.  Drinking alcohol, especially when you have not eaten recently.  If you do not have diabetes, hypoglycemia may be caused by:  A tumor in the pancreas. The pancreas is the organ that makes insulin.  Not eating enough, or not eating for long periods at a time (fasting).  Severe infection or illness that affects the liver, heart, or kidneys.  Certain medicines.  You may also have reactive hypoglycemia. This condition causes hypoglycemia within 4 hours of eating a meal. This may occur after having stomach surgery. Sometimes, the cause of reactive hypoglycemia is not known. What increases the risk? Hypoglycemia is more likely to develop in:  People who have diabetes and take medicines to lower blood glucose.  People who abuse alcohol.  People who have a severe illness.  What are the signs or symptoms? Hypoglycemia may not cause any symptoms. If you have symptoms, they may include:  Hunger.  Anxiety.  Sweating and feeling clammy.  Confusion.  Dizziness or feeling light-headed.  Sleepiness.  Nausea.  Increased heart rate.  Headache.  Blurry vision.  Seizure.  Nightmares.  Tingling or numbness around the mouth, lips, or tongue.  A change in speech.  Decreased ability to concentrate.  A change in coordination.  Restless sleep.  Tremors or shakes.  Fainting.  Irritability.  How is this diagnosed? Hypoglycemia is diagnosed with a blood test to measure your blood glucose level. This blood test is done while you are having symptoms. Your health care provider may also do a physical exam and review your medical history. If you do not have diabetes, other tests may be done to find the cause of your hypoglycemia. How is this treated? This condition can often be treated by immediately eating or drinking something that contains glucose, such as:  3-4 sugar tablets (glucose pills).  Glucose gel,  15-gram tube.  Fruit juice, 4 oz (120 mL).  Regular soda (not diet soda), 4 oz (120 mL).  Low-fat milk, 4 oz (120 mL).  Several pieces of hard candy.  Sugar or honey, 1 Tbsp.  Treating Hypoglycemia If You Have Diabetes  If you are alert and able to swallow safely, follow the 15:15 rule:  Take 15 grams of a rapid-acting carbohydrate. Rapid-acting options include: ? 1 tube of glucose gel. ? 3 glucose pills. ? 6-8 pieces of hard  candy. ? 4 oz (120 mL) of fruit juice. ? 4 oz (120 ml) of regular (not diet) soda.  Check your blood glucose 15 minutes after you take the carbohydrate.  If the repeat blood glucose level is still at or below 70 mg/dL (3.9 mmol/L), take 15 grams of a carbohydrate again.  If your blood glucose level does not increase above 70 mg/dL (3.9 mmol/L) after 3 tries, seek emergency medical care.  After your blood glucose level returns to normal, eat a meal or a snack within 1 hour.  Treating Severe Hypoglycemia Severe hypoglycemia is when your blood glucose level is at or below 54 mg/dL (3 mmol/L). Severe hypoglycemia is an emergency. Do not wait to see if the symptoms will go away. Get medical help right away. Call your local emergency services (911 in the [place] Do not drive yourself to the hospital. If you have severe hypoglycemia and you cannot eat or drink, you may need an injection of glucagon. A family member or close friend should learn how to check your blood glucose and how to give you a glucagon injection. Ask your health care provider if you need to have an emergency glucagon injection kit available. Severe hypoglycemia may need to be treated in a hospital. The treatment may include getting glucose through an IV tube. You may also need treatment for the cause of your hypoglycemia. Follow these instructions at home: General instructions  Avoid any diets that cause you to not eat enough food. Talk with your health care provider before you start any new  diet.  Take over-the-counter and prescription medicines only as told by your health care provider.  Limit alcohol intake to no more than 1 drink per day for nonpregnant women and 2 drinks per day for men. One drink equals 12 oz of beer, 5 oz of wine, or 1 oz of hard liquor.  Keep all follow-up visits as told by your health care provider. This is important. If You Have Diabetes:   Make sure you know the symptoms of hypoglycemia.  Always have a rapid-acting carbohydrate snack with you to treat low blood sugar.  Follow your diabetes management plan, as told by your health care provider. Make sure you: ? Take your medicines as directed. ? Follow your exercise plan. ? Follow your meal plan. Eat on time, and do not skip meals. ? Check your blood glucose as often as directed. Make sure to check your blood glucose before and after exercise. If you exercise longer or in a different way than usual, check your blood glucose more often. ? Follow your sick day plan whenever you cannot eat or drink normally. Make this plan in advance with your health care provider.  Share your diabetes management plan with people in your workplace, school, and household.  Check your urine for ketones when you are ill and as told by your health care provider.  Carry a medical alert card or wear medical alert jewelry. If You Have Reactive Hypoglycemia or Low Blood Sugar From Other Causes:  Monitor your blood glucose as told by your health care provider.  Follow instructions from your health care provider about eating or drinking restrictions. Contact a health care provider if:  You have problems keeping your blood glucose in your target range.  You have frequent episodes of hypoglycemia. Get help right away if:  You continue to have hypoglycemia symptoms after eating or drinking something containing glucose.  Your blood glucose is at or below 54 mg/dL (3  mmol/L).  You have a seizure.  You faint. These  symptoms may represent a serious problem that is an emergency. Do not wait to see if the symptoms will go away. Get medical help right away. Call your local emergency services (911 in the [place] Do not drive yourself to the hospital. This information is not intended to replace advice given to you by your health care provider. Make sure you discuss any questions you have with your health care provider. Document Released: 08/23/2005 Document Revised: 02/04/2016 Document Reviewed: 09/26/2015 Elsevier Interactive Patient Education  2018 [employer].    IF you received an x-Perez today, you will receive an invoice from [facility]. Please contact [facility] at [phone] with questions or concerns regarding your invoice.   IF you received labwork today, you will receive an invoice from [facility]. Please contact [facility] at [phone] with questions or concerns regarding your invoice.   Our billing staff will not be able to assist you with questions regarding bills from these companies.  You will be contacted with the lab results as soon as they are available. The fastest way to get your results is to activate your My Chart account. Instructions are located on the last page of this paperwork. If you have not heard from [place] regarding the results in 2 weeks, please contact this office.      I personally performed the services described in this documentation, which was scribed in my presence. The recorded information has been reviewed and considered for accuracy and completeness, addended by me as needed, and agree with information above.  Signed,   Wayne Ray, MD [facility].  04/07/17 6:11 PM

## 2017-04-07 NOTE — Patient Instructions (Addendum)
No change in dose of medications for now. Check your blood sugar 1-2 times per day, but keep a record of those readings to bring to visit in 4-6 weeks. You can alternate the times of those readings, including fasting, 2 hours after meals, or at bedtime. Additionally any time you have any symptoms of low blood sugar, check your blood sugar right away. I have included information on low blood sugar symptoms below. If you notice low blood sugar readings occurring, let me know.   Hypoglycemia Hypoglycemia occurs when the level of sugar (glucose) in the blood is too low. Glucose is a type of sugar that provides the body's main source of energy. Certain hormones (insulin and glucagon) control the level of glucose in the blood. Insulin lowers blood glucose, and glucagon increases blood glucose. Hypoglycemia can result from having too much insulin in the bloodstream, or from not eating enough food that contains glucose. Hypoglycemia can happen in people who do or do not have diabetes. It can develop quickly, and it can be a medical emergency. What are the causes? Hypoglycemia occurs most often in people who have diabetes. If you have diabetes, hypoglycemia may be caused by:  Diabetes medicine.  Not eating enough, or not eating often enough.  Increased physical activity.  Drinking alcohol, especially when you have not eaten recently.  If you do not have diabetes, hypoglycemia may be caused by:  A tumor in the pancreas. The pancreas is the organ that makes insulin.  Not eating enough, or not eating for long periods at a time (fasting).  Severe infection or illness that affects the liver, heart, or kidneys.  Certain medicines.  You may also have reactive hypoglycemia. This condition causes hypoglycemia within 4 hours of eating a meal. This may occur after having stomach surgery. Sometimes, the cause of reactive hypoglycemia is not known. What increases the risk? Hypoglycemia is more likely to  develop in:  People who have diabetes and take medicines to lower blood glucose.  People who abuse alcohol.  People who have a severe illness.  What are the signs or symptoms? Hypoglycemia may not cause any symptoms. If you have symptoms, they may include:  Hunger.  Anxiety.  Sweating and feeling clammy.  Confusion.  Dizziness or feeling light-headed.  Sleepiness.  Nausea.  Increased heart rate.  Headache.  Blurry vision.  Seizure.  Nightmares.  Tingling or numbness around the mouth, lips, or tongue.  A change in speech.  Decreased ability to concentrate.  A change in coordination.  Restless sleep.  Tremors or shakes.  Fainting.  Irritability.  How is this diagnosed? Hypoglycemia is diagnosed with a blood test to measure your blood glucose level. This blood test is done while you are having symptoms. Your health care provider may also do a physical exam and review your medical history. If you do not have diabetes, other tests may be done to find the cause of your hypoglycemia. How is this treated? This condition can often be treated by immediately eating or drinking something that contains glucose, such as:  3-4 sugar tablets (glucose pills).  Glucose gel, 15-gram tube.  Fruit juice, 4 oz (120 mL).  Regular soda (not diet soda), 4 oz (120 mL).  Low-fat milk, 4 oz (120 mL).  Several pieces of hard candy.  Sugar or honey, 1 Tbsp.  Treating Hypoglycemia If You Have Diabetes  If you are alert and able to swallow safely, follow the 15:15 rule:  Take 15 grams of a rapid-acting  carbohydrate. Rapid-acting options include: ? 1 tube of glucose gel. ? 3 glucose pills. ? 6-8 pieces of hard candy. ? 4 oz (120 mL) of fruit juice. ? 4 oz (120 ml) of regular (not diet) soda.  Check your blood glucose 15 minutes after you take the carbohydrate.  If the repeat blood glucose level is still at or below 70 mg/dL (3.9 mmol/L), take 15 grams of a  carbohydrate again.  If your blood glucose level does not increase above 70 mg/dL (3.9 mmol/L) after 3 tries, seek emergency medical care.  After your blood glucose level returns to normal, eat a meal or a snack within 1 hour.  Treating Severe Hypoglycemia Severe hypoglycemia is when your blood glucose level is at or below 54 mg/dL (3 mmol/L). Severe hypoglycemia is an emergency. Do not wait to see if the symptoms will go away. Get medical help right away. Call your local emergency services (911 in the [place] Do not drive yourself to the hospital. If you have severe hypoglycemia and you cannot eat or drink, you may need an injection of glucagon. A family member or close friend should learn how to check your blood glucose and how to give you a glucagon injection. Ask your health care provider if you need to have an emergency glucagon injection kit available. Severe hypoglycemia may need to be treated in a hospital. The treatment may include getting glucose through an IV tube. You may also need treatment for the cause of your hypoglycemia. Follow these instructions at home: General instructions  Avoid any diets that cause you to not eat enough food. Talk with your health care provider before you start any new diet.  Take over-the-counter and prescription medicines only as told by your health care provider.  Limit alcohol intake to no more than 1 drink per day for nonpregnant women and 2 drinks per day for men. One drink equals 12 oz of beer, 5 oz of wine, or 1 oz of hard liquor.  Keep all follow-up visits as told by your health care provider. This is important. If You Have Diabetes:   Make sure you know the symptoms of hypoglycemia.  Always have a rapid-acting carbohydrate snack with you to treat low blood sugar.  Follow your diabetes management plan, as told by your health care provider. Make sure you: ? Take your medicines as directed. ? Follow your exercise plan. ? Follow your meal  plan. Eat on time, and do not skip meals. ? Check your blood glucose as often as directed. Make sure to check your blood glucose before and after exercise. If you exercise longer or in a different way than usual, check your blood glucose more often. ? Follow your sick day plan whenever you cannot eat or drink normally. Make this plan in advance with your health care provider.  Share your diabetes management plan with people in your workplace, school, and household.  Check your urine for ketones when you are ill and as told by your health care provider.  Carry a medical alert card or wear medical alert jewelry. If You Have Reactive Hypoglycemia or Low Blood Sugar From Other Causes:  Monitor your blood glucose as told by your health care provider.  Follow instructions from your health care provider about eating or drinking restrictions. Contact a health care provider if:  You have problems keeping your blood glucose in your target range.  You have frequent episodes of hypoglycemia. Get help right away if:  You continue to have  hypoglycemia symptoms after eating or drinking something containing glucose.  Your blood glucose is at or below 54 mg/dL (3 mmol/L).  You have a seizure.  You faint. These symptoms may represent a serious problem that is an emergency. Do not wait to see if the symptoms will go away. Get medical help right away. Call your local emergency services (911 in the [place] Do not drive yourself to the hospital. This information is not intended to replace advice given to you by your health care provider. Make sure you discuss any questions you have with your health care provider. Document Released: 08/23/2005 Document Revised: 02/04/2016 Document Reviewed: 09/26/2015 Elsevier Interactive Patient Education  2018 [employer].    IF you received an x-ray today, you will receive an invoice from [facility]. Please contact [facility] Radiology at [phone] with  questions or concerns regarding your invoice.   IF you received labwork today, you will receive an invoice from [place]. Please contact [facility] at [phone] with questions or concerns regarding your invoice.   Our billing staff will not be able to assist you with questions regarding bills from these companies.  You will be contacted with the lab results as soon as they are available. The fastest way to get your results is to activate your My Chart account. Instructions are located on the last page of this paperwork. If you have not heard from [place] regarding the results in 2 weeks, please contact this office.

## 2017-05-03 ENCOUNTER — Ambulatory Visit (INDEPENDENT_AMBULATORY_CARE_PROVIDER_SITE_OTHER): Payer: Managed Care, Other (non HMO) | Admitting: Gastroenterology

## 2017-05-03 ENCOUNTER — Encounter: Payer: Self-pay | Admitting: Gastroenterology

## 2017-05-03 VITALS — BP 110/80 | HR 88 | Ht 69.0 in | Wt 210.0 lb

## 2017-05-03 DIAGNOSIS — K219 Gastro-esophageal reflux disease without esophagitis: Secondary | ICD-10-CM | POA: Diagnosis not present

## 2017-05-03 NOTE — Patient Instructions (Signed)
Continue your nexium once daily.

## 2017-05-03 NOTE — Progress Notes (Signed)
Review of pertinent gastrointestinal problems: 1.  Routine risk for colon cancer. Colonoscopy September 2015, Dr. Ardis Hughs, found a single small polyp that was not precancerous. He was recommended for repeat colonoscopy at 10 year interval  2. GERD without alarm symptoms, response to PPI.  HPI: This is a very pleasant 56 year old man whom I last saw about 3 years ago  Chief complaint is GERD  Takes PPI nexium once daily before BF, on this he feels perfectly   No dysphagia.  Overall stable weight.  He does not drink alcohol and he does not smoke cigarettes.  No overt GI bleeding.  ROS: complete GI ROS as described in HPI, all other review negative.  Constitutional:  No unintentional weight loss   Past Medical History:  Diagnosis Date  . Diabetes mellitus without complication ([place])   . GERD (gastroesophageal reflux disease)   . Hyperlipidemia   . Hypertension     Past Surgical History:  Procedure Laterality Date  . COLONOSCOPY      Current Outpatient Prescriptions  Medication Sig Dispense Refill  . aspirin 81 MG tablet Take 81 mg by mouth daily.    . Blood Glucose Monitoring Suppl KIT One Touch Ultra or may substitute for what insurance covers.  May check once per day 100 each 6  . Dulaglutide (TRULICITY) 8.31 DV/7.6HY SOPN Inject 0.75 mg into the skin every 7 (seven) days. 6 mL 1  . esomeprazole (NEXIUM) 40 MG capsule Take 1 capsule (40 mg total) by mouth daily. 30 capsule 3  . glimepiride (AMARYL) 2 MG tablet Take 1 tablet (2 mg total) by mouth daily before breakfast. 30 tablet 3  . glimepiride (AMARYL) 4 MG tablet Take 1 tablet (4 mg total) by mouth daily with breakfast. 90 tablet 1  . glucose blood test strip accu check aviva plus - check BID (uncontrolled diabetes, on sulfonyurea with reported hypoglycemia). 100 each 6  . losartan-hydrochlorothiazide (HYZAAR) 100-25 MG tablet Take 1 tablet by mouth daily. 90 tablet 1  . metFORMIN (GLUCOPHAGE) 1000 MG tablet Take 1  tablet (1,000 mg total) by mouth 2 (two) times daily with a meal. 180 tablet 1  . simvastatin (ZOCOR) 20 MG tablet Take 1 tablet (20 mg total) by mouth every evening. 90 tablet 1  . sitaGLIPtin (JANUVIA) 100 MG tablet Take 1 tablet (100 mg total) by mouth daily. 90 tablet 1  . tamsulosin (FLOMAX) 0.4 MG CAPS capsule Take 1 capsule (0.4 mg total) by mouth daily. 30 capsule 3   No current facility-administered medications for this visit.     Allergies as of 05/03/2017 - Review Complete 05/03/2017  Allergen Reaction Noted  . Ace inhibitors Itching 07/31/2012    Family History  Problem Relation Age of Onset  . Colon cancer Neg Hx   . Esophageal cancer Neg Hx     Social History   Social History  . Marital status: Married    Spouse name: N/A  . Number of children: 3  . Years of education: N/A   Occupational History  . [employer]   Social History Main Topics  . Smoking status: Never Smoker  . Smokeless tobacco: Never Used  . Alcohol use No  . Drug use: No  . Sexual activity: Yes   Other Topics Concern  . Not on file   Social History Narrative   Married. Education: [employer]. Exercise: Walks twice a week for 1-2 hours.     Physical Exam: BP 110/80   Pulse 88   Ht  _0  (1.753 m)   Wt 210 lb (95.3 kg)   BMI 31.01 kg/m  Constitutional: generally well-appearing Psychiatric: alert and oriented x3 Abdomen: soft, nontender, nondistended, no obvious ascites, no peritoneal signs, normal bowel sounds No peripheral edema noted in lower extremities  Assessment and plan: 56 y.o. male with Chronic GERD without alarm symptoms  I explained to him that he is at low risk for complications of GERD. He is a nonsmoker, he does not drink alcohol he has no alarm symptoms of dysphasia, weight loss or overt GI bleeding. He does not need EGD at this point instead recommended he continue proton pump inhibitor once daily.  Please see the "Patient Instructions" section for addition  details about the plan.  Owens Loffler, MD [facility] 05/03/2017, 4:03 PM

## 2017-05-14 ENCOUNTER — Ambulatory Visit (INDEPENDENT_AMBULATORY_CARE_PROVIDER_SITE_OTHER): Payer: Managed Care, Other (non HMO) | Admitting: Family Medicine

## 2017-05-14 ENCOUNTER — Encounter: Payer: Self-pay | Admitting: Family Medicine

## 2017-05-14 VITALS — BP 120/82 | HR 76 | Temp 98.0°F | Resp 16 | Ht 70.87 in | Wt 208.0 lb

## 2017-05-14 DIAGNOSIS — E119 Type 2 diabetes mellitus without complications: Secondary | ICD-10-CM | POA: Diagnosis not present

## 2017-05-14 DIAGNOSIS — K219 Gastro-esophageal reflux disease without esophagitis: Secondary | ICD-10-CM

## 2017-05-14 DIAGNOSIS — E1165 Type 2 diabetes mellitus with hyperglycemia: Secondary | ICD-10-CM

## 2017-05-14 DIAGNOSIS — I10 Essential (primary) hypertension: Secondary | ICD-10-CM | POA: Diagnosis not present

## 2017-05-14 DIAGNOSIS — E785 Hyperlipidemia, unspecified: Secondary | ICD-10-CM | POA: Diagnosis not present

## 2017-05-14 MED ORDER — SIMVASTATIN 20 MG PO TABS: 20.0000 mg | ORAL_TABLET | Freq: Every evening | ORAL | 5 refills | Status: DC

## 2017-05-14 MED ORDER — GLIMEPIRIDE 4 MG PO TABS: 4.0000 mg | ORAL_TABLET | Freq: Every day | ORAL | 5 refills | Status: DC

## 2017-05-14 MED ORDER — DULAGLUTIDE 0.75 MG/0.5ML ~~LOC~~ SOAJ: 0.7500 mg | SUBCUTANEOUS | 5 refills | Status: DC

## 2017-05-14 MED ORDER — METFORMIN HCL 1000 MG PO TABS: 1000.0000 mg | ORAL_TABLET | Freq: Two times a day (BID) | ORAL | 5 refills | Status: DC

## 2017-05-14 MED ORDER — SITAGLIPTIN PHOSPHATE 100 MG PO TABS: 100.0000 mg | ORAL_TABLET | Freq: Every day | ORAL | 5 refills | Status: DC

## 2017-05-14 MED ORDER — LOSARTAN POTASSIUM-HCTZ 100-25 MG PO TABS: 1.0000 | ORAL_TABLET | Freq: Every day | ORAL | 5 refills | Status: DC

## 2017-05-14 MED ORDER — ESOMEPRAZOLE MAGNESIUM 40 MG PO CPDR: 40.0000 mg | DELAYED_RELEASE_CAPSULE | Freq: Every day | ORAL | 5 refills | Status: DC

## 2017-05-14 MED ORDER — GLIMEPIRIDE 2 MG PO TABS: 2.0000 mg | ORAL_TABLET | Freq: Every day | ORAL | 5 refills | Status: DC

## 2017-05-14 NOTE — Progress Notes (Signed)
Subjective:  By signing my name below, I, Wayne Perez, attest that this documentation has been prepared under the direction and in the presence of [name], MD. Electronically Signed: [name], [place]. 05/14/2017 , 10:28 AM .  Patient was seen in Room 11 .   Patient ID: Wayne Perez, male    DOB: 10/27/1960, 56 y.o.   MRN: [id number] Chief Complaint  Patient presents with  . Diabetes    1 month follow-up  . Hypertension   HPI Wayne Perez is a 56 y.o. male Here for follow up. He has history of DM, HTN, and GERD. He is fasting today. His insurance only covers 30-day supplies.   He has 2 children, both attending [employer].   DM Lab Results  Component Value Date   HGBA1C 9.7 (H) 02/02/2017   Lab Results  Component Value Date   MICROALBUR 5.7 (H) 03/03/2015   His A1C increased from 8.3 to 9.7 at last visit. He was started on Trulicity 8.41YS on June 18th. He was continued on Amaryl 76m but increased to 658mQD. He's continued on metformin 100066mID and Januvia 100m72m. He denies any new tingling or loss of sensation in his feet and toes.   He's been checking his Perez sugars at home, varying range in 120s-130s mostly, highest 170; denies any symptomatic lows.   Hyperlipidemia Lab Results  Component Value Date   CHOL 139 11/01/2016   HDL 44 11/01/2016   LDLCALC 72 11/01/2016   TRIG 115 11/01/2016   CHOLHDL 3.2 11/01/2016   Lab Results  Component Value Date   ALT 26 11/01/2016   AST 24 11/01/2016   ALKPHOS 58 11/01/2016   BILITOT 0.3 11/01/2016   He takes Zocor 20mg9m   HTN Lab Results  Component Value Date   CREATININE 1.09 02/02/2017   He's taking Hyzaar 100-25mg 70mHe took his medication when he came in. He denies missing medication. He denies unexpected weight loss, chest pain, shortness of breath, abdominal pain, dark or tarry stools, nausea, vomiting, lightheadedness or dizziness.   His home BP readings have been 116-120/70s; doesn't have  a cuff at home and checks at pharmacy.   GERD See last office visit on Aug 2nd, he was evaluated by Dr. JacobsArdis Hughsent is tolerating Nexium QD, no alarm symptoms. No EGD needed at this point with Nexium QD.   Patient Active Problem List   Diagnosis Date Noted  . Erectile dysfunction due to arterial insufficiency 08/07/2015  . Varicocele 08/07/2015  . Diabetes (HCC) 1[place]5/2013  . HTN (hypertension) 07/31/2012   Past Medical History:  Diagnosis Date  . Diabetes mellitus without complication (HCC)  [place] (gastro[place] disease[place]   . Hyperlipidemia   . Hypertension    Past Surgical History:  Procedure Laterality Date  . COLONOSCOPY     Allergies  Allergen Reactions  . Ace Inhibitors Itching   Prior to Admission medications   Medication Sig Start Date End Date Taking? Authorizing Provider  aspirin 81 MG tablet Take 81 mg by mouth daily.   Yes [provider]  Perez Glucose Monitoring Suppl KIT One Touch Ultra or may substitute for what insurance covers.  May check once per day 09/09/14  Yes GreeneWendie AgresteDulaglutide (TRULICITY) 0.75 M0.635KZ/6.0FUInject 0.75 mg into the skin every 7 (seven) days. 02/21/17  Yes GreeneWendie Agresteesomeprazole (NEXIUM) 40 MG capsule Take 1 capsule (40 mg total) by mouth daily. 02/21/17  Yes  [name], MD  glimepiride (AMARYL) 2 MG tablet Take 1 tablet (2 mg total) by mouth daily before breakfast. 02/21/17  Yes [name], MD  glimepiride (AMARYL) 4 MG tablet Take 1 tablet (4 mg total) by mouth daily with breakfast. 02/02/17  Yes [name], MD  glucose Perez test strip accu check aviva plus - check BID (uncontrolled diabetes, on sulfonyurea with reported hypoglycemia). 04/07/17  Yes [name], MD  losartan-hydrochlorothiazide (HYZAAR) 100-25 MG tablet Take 1 tablet by mouth daily. 02/02/17  Yes [name], MD  metFORMIN (GLUCOPHAGE) 1000 MG tablet Take 1 tablet (1,000 mg total) by mouth 2  (two) times daily with a meal. 02/02/17  Yes [name], MD  simvastatin (ZOCOR) 20 MG tablet Take 1 tablet (20 mg total) by mouth every evening. 02/02/17  Yes [name], MD  sitaGLIPtin (JANUVIA) 100 MG tablet Take 1 tablet (100 mg total) by mouth daily. 02/02/17  Yes [name], MD  tamsulosin (FLOMAX) 0.4 MG CAPS capsule Take 1 capsule (0.4 mg total) by mouth daily. 06/05/16  Yes Timmothy Euler, MD   Social History   Social History  . Marital status: Married    Spouse name: N/A  . Number of children: 3  . Years of education: N/A   Occupational History  . [employer]   Social History Main Topics  . Smoking status: Never Smoker  . Smokeless tobacco: Never Used  . Alcohol use No  . Drug use: No  . Sexual activity: Yes   Other Topics Concern  . Not on file   Social History Narrative   Married. Education: [employer]. Exercise: Walks twice a week for 1-2 hours.   Review of Systems  Constitutional: Negative for fatigue and unexpected weight change.  Eyes: Negative for visual disturbance.  Respiratory: Negative for cough, chest tightness and shortness of breath.   Cardiovascular: Negative for chest pain, palpitations and leg swelling.  Gastrointestinal: Negative for abdominal pain and Perez in stool.  Neurological: Negative for dizziness, light-headedness and headaches.       Objective:   Physical Exam  Constitutional: He is oriented to person, place, and time. He appears well-developed and well-nourished.  HENT:  Head: Normocephalic and atraumatic.  Eyes: Pupils are equal, round, and reactive to light. EOM are normal.  Neck: No JVD present. Carotid bruit is not present.  Cardiovascular: Normal rate, regular rhythm and normal heart sounds.   No murmur heard. Pulmonary/Chest: Effort normal and breath sounds normal. He has no rales.  Musculoskeletal: He exhibits no edema.  Neurological: He is alert and oriented to person, place, and time.  Skin: Skin  is warm and dry.  Psychiatric: He has a normal mood and affect.  Vitals reviewed.   Vitals:   05/14/17 0958  BP: 136/90  Pulse: 76  Resp: 16  Temp: 98 F (36.7 C)  TempSrc: Oral  SpO2: 98%  Weight: 208 lb (94.3 kg)  Height: 5' 10.87" (1.8 m)      Assessment & Plan:   Wayne Perez is a 56 y.o. male Type 2 diabetes mellitus with hyperglycemia, without long-term current use of insulin ([place]) - Plan: Hemoglobin A1c, Dulaglutide (TRULICITY) 8.24 MP/5.3IR SOPN Type 2 diabetes mellitus without complication, without long-term current use of insulin (HCC) - Plan: glimepiride (AMARYL) 2 MG tablet, glimepiride (AMARYL) 4 MG tablet, metFORMIN (GLUCOPHAGE) 1000 MG tablet, sitaGLIPtin (JANUVIA) 100 MG tablet  - Uncontrolled on prior A1c. Improved home readings. Continue Januvia  100 mg daily, glipizide 60 mg daily, metformin 1000 mg twice a day, and same dose Trulicity for now. Consider increased dose of Trulicity if elevated H8I.   Essential hypertension - Plan: losartan-hydrochlorothiazide (HYZAAR) 100-25 MG tablet  - Continue Hyzaar same dose for now. Labs pending. Goal Perez pressure less than 130/80 with diabetes.  Gastroesophageal reflux disease, esophagitis presence not specified - Plan: esomeprazole (NEXIUM) 40 MG capsule  - Continue Nexium 40 mg daily. Records reviewed from gastroenterology. Does not need EGD at this point  Hyperlipidemia, unspecified hyperlipidemia type - Plan: simvastatin (ZOCOR) 20 MG tablet  - Tolerating Zocor, lipids pending. Goal LDL less than 70.  Meds ordered this encounter  Medications  . Dulaglutide (TRULICITY) 5.02 DX/4.1OI SOPN    Sig: Inject 0.75 mg into the skin every 7 (seven) days.    Dispense:  2 mL    Refill:  5  . glimepiride (AMARYL) 2 MG tablet    Sig: Take 1 tablet (2 mg total) by mouth daily before breakfast.    Dispense:  30 tablet    Refill:  5  . glimepiride (AMARYL) 4 MG tablet    Sig: Take 1 tablet (4 mg total) by mouth daily with  breakfast.    Dispense:  30 tablet    Refill:  5  . esomeprazole (NEXIUM) 40 MG capsule    Sig: Take 1 capsule (40 mg total) by mouth daily.    Dispense:  30 capsule    Refill:  5  . losartan-hydrochlorothiazide (HYZAAR) 100-25 MG tablet    Sig: Take 1 tablet by mouth daily.    Dispense:  30 tablet    Refill:  5  . metFORMIN (GLUCOPHAGE) 1000 MG tablet    Sig: Take 1 tablet (1,000 mg total) by mouth 2 (two) times daily with a meal.    Dispense:  60 tablet    Refill:  5  . simvastatin (ZOCOR) 20 MG tablet    Sig: Take 1 tablet (20 mg total) by mouth every evening.    Dispense:  30 tablet    Refill:  5  . sitaGLIPtin (JANUVIA) 100 MG tablet    Sig: Take 1 tablet (100 mg total) by mouth daily.    Dispense:  30 tablet    Refill:  5   Patient Instructions     Continue same medication for now. If A1c is still elevated, we will likely increase the Trulicity.   Recheck in 3 months.   IF you received an x-ray today, you will receive an invoice from [facility]. Please contact [facility] at [phone] with questions or concerns regarding your invoice.   IF you received labwork today, you will receive an invoice from [place]. Please contact [facility] at [phone] with questions or concerns regarding your invoice.   Our billing staff will not be able to assist you with questions regarding bills from these companies.  You will be contacted with the lab results as soon as they are available. The fastest way to get your results is to activate your My Chart account. Instructions are located on the last page of this paperwork. If you have not heard from [place] regarding the results in 2 weeks, please contact this office.       I personally performed the services described in this documentation, which was scribed in my presence. The recorded information has been reviewed and considered for accuracy and completeness, addended by me as needed, and agree with information  above.  Signed,  [name], MD [facility] [place].  05/14/17 10:35 AM

## 2017-05-14 NOTE — Patient Instructions (Addendum)
   Continue same medication for now. If A1c is still elevated, we will likely increase the Trulicity.   Recheck in 3 months.   IF you received an x-ray today, you will receive an invoice from [facility]. Please contact [facility] at [phone] with questions or concerns regarding your invoice.   IF you received labwork today, you will receive an invoice from [place]. Please contact [facility] at [phone] with questions or concerns regarding your invoice.   Our billing staff will not be able to assist you with questions regarding bills from these companies.  You will be contacted with the lab results as soon as they are available. The fastest way to get your results is to activate your My Chart account. Instructions are located on the last page of this paperwork. If you have not heard from [place] regarding the results in 2 weeks, please contact this office.

## 2017-05-15 LAB — COMPREHENSIVE METABOLIC PANEL
A/G RATIO: 1.3 (ref 1.2–2.2)
ALBUMIN: 4 g/dL (ref 3.5–5.5)
ALT: 27 IU/L (ref 0–44)
AST: 29 IU/L (ref 0–40)
Alkaline Phosphatase: 61 IU/L (ref 39–117)
BILIRUBIN TOTAL: 0.3 mg/dL (ref 0.0–1.2)
BUN/Creatinine Ratio: 11 (ref 9–20)
BUN: 12 mg/dL (ref 6–24)
CALCIUM: 9.1 mg/dL (ref 8.7–10.2)
CO2: 22 mmol/L (ref 20–29)
Chloride: 102 mmol/L (ref 96–106)
Creatinine, Ser: 1.05 mg/dL (ref 0.76–1.27)
GFR calc Af Amer: 93 mL/min/{1.73_m2} (ref >59)
GFR, EST NON AFRICAN AMERICAN: 81 mL/min/{1.73_m2} (ref >59)
Globulin, Total: 3 g/dL (ref 1.5–4.5)
Glucose: 104 mg/dL — ABNORMAL HIGH (ref 65–99)
POTASSIUM: 4.1 mmol/L (ref 3.5–5.2)
Sodium: 140 mmol/L (ref 134–144)
TOTAL PROTEIN: 7 g/dL (ref 6.0–8.5)

## 2017-05-15 LAB — LIPID PANEL
CHOL/HDL RATIO: 3 ratio (ref 0.0–5.0)
Cholesterol, Total: 114 mg/dL (ref 100–199)
HDL: 38 mg/dL — ABNORMAL LOW (ref >39)
LDL Calculated: 57 mg/dL (ref 0–99)
TRIGLYCERIDES: 94 mg/dL (ref 0–149)
VLDL Cholesterol Cal: 19 mg/dL (ref 5–40)

## 2017-05-15 LAB — HEMOGLOBIN A1C
Est. average glucose Bld gHb Est-mCnc: 163 mg/dL
HEMOGLOBIN A1C: 7.3 % — AB (ref 4.8–5.6)

## 2017-08-13 ENCOUNTER — Encounter: Payer: Self-pay | Admitting: Family Medicine

## 2017-08-13 ENCOUNTER — Other Ambulatory Visit: Payer: Self-pay

## 2017-08-13 ENCOUNTER — Ambulatory Visit: Payer: Managed Care, Other (non HMO) | Admitting: Family Medicine

## 2017-08-13 VITALS — BP 110/72 | HR 88 | Temp 98.6°F | Resp 18 | Ht 70.87 in | Wt 208.4 lb

## 2017-08-13 DIAGNOSIS — I10 Essential (primary) hypertension: Secondary | ICD-10-CM

## 2017-08-13 DIAGNOSIS — K219 Gastro-esophageal reflux disease without esophagitis: Secondary | ICD-10-CM | POA: Diagnosis not present

## 2017-08-13 DIAGNOSIS — E119 Type 2 diabetes mellitus without complications: Secondary | ICD-10-CM | POA: Diagnosis not present

## 2017-08-13 DIAGNOSIS — E1165 Type 2 diabetes mellitus with hyperglycemia: Secondary | ICD-10-CM | POA: Diagnosis not present

## 2017-08-13 MED ORDER — DULAGLUTIDE 1.5 MG/0.5ML ~~LOC~~ SOAJ: 1.5000 mg | SUBCUTANEOUS | 2 refills | Status: DC

## 2017-08-13 MED ORDER — OMEPRAZOLE 20 MG PO CPDR: 20.0000 mg | DELAYED_RELEASE_CAPSULE | Freq: Every day | ORAL | 3 refills | Status: DC

## 2017-08-13 MED ORDER — GLIMEPIRIDE 2 MG PO TABS: 2.0000 mg | ORAL_TABLET | Freq: Every day | ORAL | 1 refills | Status: DC

## 2017-08-13 MED ORDER — GLIMEPIRIDE 4 MG PO TABS: 4.0000 mg | ORAL_TABLET | Freq: Every day | ORAL | 1 refills | Status: DC

## 2017-08-13 MED ORDER — LOSARTAN POTASSIUM-HCTZ 100-25 MG PO TABS: 1.0000 | ORAL_TABLET | Freq: Every day | ORAL | 1 refills | Status: DC

## 2017-08-13 MED ORDER — METFORMIN HCL 1000 MG PO TABS: 1000.0000 mg | ORAL_TABLET | Freq: Two times a day (BID) | ORAL | 1 refills | Status: DC

## 2017-08-13 NOTE — Progress Notes (Signed)
Subjective:  By signing my name below, I, Moises Blood, attest that this documentation has been prepared under the direction and in the presence of [name], MD. Electronically Signed: Moises Blood, [place]. 08/13/2017 , 9:59 AM .  Patient was seen in Room 10 .   Patient ID: Wayne Perez, male    DOB: Jan 23, 1961, 56 y.o.   MRN: [id number] Chief Complaint  Patient presents with  . Diabetes    3 month follow up    HPI Wayne Perez is a 56 y.o. male Here for follow up on DM.   DM Lab Results  Component Value Date   HGBA1C 7.3 (H) 05/14/2017   Lab Results  Component Value Date   MICROALBUR 5.7 (H) 03/03/2015   Decided to continue on same medication with close monitoring of diet and exercise. His medication includes Trulicity 4.08XK weekly, amaryl 42m QD, metformin 10054mBID, and Januvia 10050mD.   He's checks his sugar at home occasionally, highest reading in 150s. He denies any symptomatic hypoglycemia.   GERD See prior visits. He has seen GI, Dr. JacArdis Hughsast seen on Aug 28th; continue nexium before breakfast. His insurance informs him that they won't cover nexium 26m5m 2019.   HTN He had slightly elevated reading at that time with diabetes goal <130/80. He was continued on Hyzaar 100-25mg25m   Wt Readings from Last 3 Encounters:  08/13/17 208 lb 6.4 oz (94.5 kg)  05/14/17 208 lb (94.3 kg)  05/03/17 210 lb (95.3 kg)   He's still exercising regularly. He denies chest pain, shortness of breath, blood in stool, lightheadedness, or dizziness.   Patient Active Problem List   Diagnosis Date Noted  . Erectile dysfunction due to arterial insufficiency 08/07/2015  . Varicocele 08/07/2015  . Diabetes (HCC) [place]25/2013  . HTN (hypertension) 07/31/2012   Past Medical History:  Diagnosis Date  . Diabetes mellitus without complication (HCC) [place] GERD (gastroesophageal reflux disease)   . Hyperlipidemia   . Hypertension    Past Surgical History:  Procedure  Laterality Date  . COLONOSCOPY     Allergies  Allergen Reactions  . Ace Inhibitors Itching   Prior to Admission medications   Medication Sig Start Date End Date Taking? Authorizing Provider  aspirin 81 MG tablet Take 81 mg by mouth daily.    [provider]  Blood Glucose Monitoring Suppl KIT One Touch Ultra or may substitute for what insurance covers.  May check once per day 09/09/14   GreenWendie Agreste Dulaglutide (TRULICITY) 0.75 4.81.EH/6.3JS Inject 0.75 mg into the skin every 7 (seven) days. 05/14/17   GreenWendie Agreste esomeprazole (NEXIUM) 40 MG capsule Take 1 capsule (40 mg total) by mouth daily. 05/14/17   GreenWendie Agreste glimepiride (AMARYL) 2 MG tablet Take 1 tablet (2 mg total) by mouth daily before breakfast. 05/14/17   GreenWendie Agreste glimepiride (AMARYL) 4 MG tablet Take 1 tablet (4 mg total) by mouth daily with breakfast. 05/14/17   GreenWendie Agreste glucose blood test strip accu check aviva plus - check BID (uncontrolled diabetes, on sulfonyurea with reported hypoglycemia). 04/07/17   GreenWendie Agreste losartan-hydrochlorothiazide (HYZAAR) 100-25 MG tablet Take 1 tablet by mouth daily. 05/14/17   GreenWendie Agreste metFORMIN (GLUCOPHAGE) 1000 MG tablet Take 1 tablet (1,000 mg total) by mouth 2 (two) times daily with a meal. 05/14/17   GreenWendie Agreste simvastatin (  ZOCOR) 20 MG tablet Take 1 tablet (20 mg total) by mouth every evening. 05/14/17   [name], MD  sitaGLIPtin (JANUVIA) 100 MG tablet Take 1 tablet (100 mg total) by mouth daily. 05/14/17   [name], MD  tamsulosin (FLOMAX) 0.4 MG CAPS capsule Take 1 capsule (0.4 mg total) by mouth daily. 06/05/16   Timmothy Euler, MD   Social History   Socioeconomic History  . Marital status: Married    Spouse name: Not on file  . Number of children: 3  . Years of education: Not on file  . Highest education level: Not on file  Social Needs  . Financial resource strain: Not  on file  . Food insecurity - worry: Not on file  . Food insecurity - inability: Not on file  . Transportation needs - medical: Not on file  . Transportation needs - non-medical: Not on file  Occupational History  . Occupation: Airline pilot: [employer]  Tobacco Use  . Smoking status: Never Smoker  . Smokeless tobacco: Never Used  Substance and Sexual Activity  . Alcohol use: No  . Drug use: No  . Sexual activity: Yes  Other Topics Concern  . Not on file  Social History Narrative   Married. Education: [employer]. Exercise: Walks twice a week for 1-2 hours.   Review of Systems  Constitutional: Negative for fatigue and unexpected weight change.  Eyes: Negative for visual disturbance.  Respiratory: Negative for cough, chest tightness and shortness of breath.   Cardiovascular: Negative for chest pain, palpitations and leg swelling.  Gastrointestinal: Negative for abdominal pain and blood in stool.  Neurological: Negative for dizziness, light-headedness and headaches.       Objective:   Physical Exam  Constitutional: He is oriented to person, place, and time. He appears well-developed and well-nourished.  HENT:  Head: Normocephalic and atraumatic.  Eyes: EOM are normal. Pupils are equal, round, and reactive to light.  Neck: No JVD present. Carotid bruit is not present.  Cardiovascular: Normal rate, regular rhythm and normal heart sounds.  No murmur heard. Pulmonary/Chest: Effort normal and breath sounds normal. He has no rales.  Musculoskeletal: He exhibits no edema.  Neurological: He is alert and oriented to person, place, and time.  Skin: Skin is warm and dry.  Psychiatric: He has a normal mood and affect.  Vitals reviewed.   Vitals:   08/13/17 0917  BP: 110/72  Pulse: 88  Resp: 18  Temp: 98.6 F (37 C)  TempSrc: Oral  SpO2: 100%  Weight: 208 lb 6.4 oz (94.5 kg)  Height: 5' 10.87" (1.8 m)      Assessment & Plan:   Wayne Perez is a 56 y.o. male Type 2 diabetes  mellitus without complication, without long-term current use of insulin ([place]) - Plan: metFORMIN (GLUCOPHAGE) 1000 MG tablet, glimepiride (AMARYL) 4 MG tablet, glimepiride (AMARYL) 2 MG tablet, Dulaglutide (TRULICITY) 1.5 JX/9.1YN SOPN, Hemoglobin A1c  - Borderline control last visit. On review of medications currently, will stop Januvia as on Trulicity. Will increase Trulicity to 8.2NF qweek.  Continue metformin same dose, Amaryl same dose. Check A1c, then follow-up in 3 months  Essential hypertension - Plan: losartan-hydrochlorothiazide (HYZAAR) 100-25 MG tablet  -Controlled, continue same dose of Hyzaar as tolerating  Gastroesophageal reflux disease, esophagitis presence not specified - Plan: omeprazole (PRILOSEC) 20 MG capsule  -Stable on Nexium, asymptomatic. Due to cost concerns will change to omeprazole 20 mg daily. If breakthrough symptoms of reflux, consider  increase to 40 mg.  Meds ordered this encounter  Medications  . omeprazole (PRILOSEC) 20 MG capsule    Sig: Take 1 capsule (20 mg total) by mouth daily.    Dispense:  90 capsule    Refill:  3  . metFORMIN (GLUCOPHAGE) 1000 MG tablet    Sig: Take 1 tablet (1,000 mg total) by mouth 2 (two) times daily with a meal.    Dispense:  180 tablet    Refill:  1  . losartan-hydrochlorothiazide (HYZAAR) 100-25 MG tablet    Sig: Take 1 tablet by mouth daily.    Dispense:  90 tablet    Refill:  1  . glimepiride (AMARYL) 4 MG tablet    Sig: Take 1 tablet (4 mg total) by mouth daily with breakfast.    Dispense:  90 tablet    Refill:  1  . glimepiride (AMARYL) 2 MG tablet    Sig: Take 1 tablet (2 mg total) by mouth daily before breakfast.    Dispense:  90 tablet    Refill:  1  . Dulaglutide (TRULICITY) 1.5 IH/4.7QQ SOPN    Sig: Inject 1.5 mg into the skin every 7 (seven) days.    Dispense:  6 mL    Refill:  2   Patient Instructions   Try omeprazole in place of the Nexium.  If you have worsening heartburn. Let me know and I can write  the higher dose.   Stop [place], continue other medications for diabetes including the higher dose of Trulicity. Recheck diabetes in 3 months.      IF you received an x-ray today, you will receive an invoice from [facility]. Please contact [facility] at [phone] with questions or concerns regarding your invoice.   IF you received labwork today, you will receive an invoice from [place]. Please contact [facility] at [phone] with questions or concerns regarding your invoice.   Our billing staff will not be able to assist you with questions regarding bills from these companies.  You will be contacted with the lab results as soon as they are available. The fastest way to get your results is to activate your My Chart account. Instructions are located on the last page of this paperwork. If you have not heard from [place] regarding the results in 2 weeks, please contact this office.      I personally performed the services described in this documentation, which was scribed in my presence. The recorded information has been reviewed and considered for accuracy and completeness, addended by me as needed, and agree with information above.  Signed,   [name], MD [facility] at [place].  08/13/17 10:08 AM

## 2017-08-13 NOTE — Patient Instructions (Addendum)
Try omeprazole in place of the Nexium.  If you have worsening heartburn. Let me know and I can write the higher dose.   Stop [place], continue other medications for diabetes including the higher dose of Trulicity. Recheck diabetes in 3 months.    Thanks for coming in today.    IF you received an x-ray today, you will receive an invoice from [facility] Rad[facility]. Please contact [facility] Radiology at [phone] with questions or concerns regarding your invoice.   IF you received labwork today, you will receive an invoice from [place]. Please contact [facility] at [phone] with questions or concerns regarding your invoice.   Our billing staff will not be able to assist you with questions regarding bills from these companies.  You will be contacted with the lab results as soon as they are available. The fastest way to get your results is to activate your My Chart account. Instructions are located on the last page of this paperwork. If you have not heard from [place] regarding the results in 2 weeks, please contact this office.

## 2017-08-14 LAB — HEMOGLOBIN A1C
Est. average glucose Bld gHb Est-mCnc: 169 mg/dL
HEMOGLOBIN A1C: 7.5 % — AB (ref 4.8–5.6)

## 2017-12-16 ENCOUNTER — Telehealth: Payer: Self-pay | Admitting: Family Medicine

## 2017-12-16 NOTE — Telephone Encounter (Signed)
Attempted to call patient regarding the losartan HCTZ that he is taking. No answer, left a detail message for him to call the office back regarding this medication.

## 2017-12-16 NOTE — Telephone Encounter (Signed)
Received notice from [facility] that losartan HCTZ may have been recalled.  Has he been contacted by pharmacy?  We can change to alternative angiotensin receptor blocker if his medication was recalled.  Please let me know.

## 2017-12-21 ENCOUNTER — Encounter: Payer: Self-pay | Admitting: Family Medicine

## 2017-12-22 NOTE — Telephone Encounter (Unsigned)
Copied from [place] [id number]. Topic: Inquiry >> Dec 21, 2017  5:17 PM Oliver Pila B wrote: Reason for CRM: pt is coming in for an appt on Friday and is wanting to come in-in the morning of Friday to get labs done, contact pt to advise on what lab order to generate

## 2017-12-23 ENCOUNTER — Ambulatory Visit (INDEPENDENT_AMBULATORY_CARE_PROVIDER_SITE_OTHER): Payer: Managed Care, Other (non HMO) | Admitting: Family Medicine

## 2017-12-23 ENCOUNTER — Other Ambulatory Visit: Payer: Self-pay

## 2017-12-23 ENCOUNTER — Encounter: Payer: Self-pay | Admitting: Family Medicine

## 2017-12-23 VITALS — BP 122/86 | HR 75 | Temp 98.0°F | Resp 16 | Ht 70.0 in | Wt 207.0 lb

## 2017-12-23 DIAGNOSIS — Z114 Encounter for screening for human immunodeficiency virus [HIV]: Secondary | ICD-10-CM

## 2017-12-23 DIAGNOSIS — Z1159 Encounter for screening for other viral diseases: Secondary | ICD-10-CM

## 2017-12-23 DIAGNOSIS — E119 Type 2 diabetes mellitus without complications: Secondary | ICD-10-CM | POA: Diagnosis not present

## 2017-12-23 DIAGNOSIS — E785 Hyperlipidemia, unspecified: Secondary | ICD-10-CM | POA: Diagnosis not present

## 2017-12-23 NOTE — Patient Instructions (Addendum)
  No change in medications for now.  I will let you know the results of your blood work and decide if any diabetic changes needed, otherwise follow-up in 3 months.  Thank you for coming in today.   IF you received an x-ray today, you will receive an invoice from [facility]. Please contact [facility] Radiology at [phone] with questions or concerns regarding your invoice.   IF you received labwork today, you will receive an invoice from [place]. Please contact [facility] at [phone] with questions or concerns regarding your invoice.   Our billing staff will not be able to assist you with questions regarding bills from these companies.  You will be contacted with the lab results as soon as they are available. The fastest way to get your results is to activate your My Chart account. Instructions are located on the last page of this paperwork. If you have not heard from [place] regarding the results in 2 weeks, please contact this office.

## 2017-12-23 NOTE — Progress Notes (Signed)
Subjective:  By signing my name below, I, Moises Blood, attest that this documentation has been prepared under the direction and in the presence of [name], MD. Electronically Signed: Moises Blood, [place]. 12/23/2017 , 2:26 PM .  Patient was seen in Room 11 .   Patient ID: Wayne Perez, male    DOB: 1961/07/13, 57 y.o.   MRN: [id number] Chief Complaint  Patient presents with  . Chronic Conditions    4 month follow up   HPI Wayne Perez is a 57 y.o. male Here for follow up. He is fasting today.   DM He is on Trulicity, Amaryl and metformin. Januvia was stopped at last visit. He is on Trulicity 3.1SH.   Lab Results  Component Value Date   HGBA1C 7.5 (H) 08/13/2017   Wt Readings from Last 3 Encounters:  12/23/17 207 lb (93.9 kg)  08/13/17 208 lb 6.4 oz (94.5 kg)  05/14/17 208 lb (94.3 kg)   Urine microalbumin: Feb 2018, which was 34.1.  Pneumovax: done 02/02/17.  Optho: 12/20/16.  Foot exam: today, 12/23/17.   He checks his blood sugars occasionally, 130-150s, up to 160s; rarely up to 200. He denies any complications with his medications.   Hep C screening: updated today.  HIV screening: updated today.   Lipid screening Lab Results  Component Value Date   CHOL 114 05/14/2017   HDL 38 (L) 05/14/2017   LDLCALC 57 05/14/2017   TRIG 94 05/14/2017   CHOLHDL 3.0 05/14/2017   He takes Zocor 17m QD. He denies any complications with his Zocor.   HTN He takes Hyzaar 100-257mQD. He denies chest pain, shortness of breath, lightheadedness, dizziness, or blurred vision.   BP Readings from Last 3 Encounters:  12/23/17 122/86  08/13/17 110/72  05/14/17 120/82    GERD He takes omeprazole 2049mD. He had some heartburn early on, but reflux improved now.    Patient Active Problem List   Diagnosis Date Noted  . Erectile dysfunction due to arterial insufficiency 08/07/2015  . Varicocele 08/07/2015  . Diabetes ([place]/25/2013  . HTN (hypertension) 07/31/2012    Past Medical History:  Diagnosis Date  . Diabetes mellitus without complication ([place] . GERD (gastroesophageal reflux disease)   . Hyperlipidemia   . Hypertension    Past Surgical History:  Procedure Laterality Date  . COLONOSCOPY     Allergies  Allergen Reactions  . Ace Inhibitors Itching   Prior to Admission medications   Medication Sig Start Date End Date Taking? Authorizing Provider  aspirin 81 MG tablet Take 81 mg by mouth daily.   Yes [provider]  Blood Glucose Monitoring Suppl KIT One Touch Ultra or may substitute for what insurance covers.  May check once per day 09/09/14  Yes GreWendie AgresteD  Dulaglutide (TRULICITY) 1.5 MG/FW/2.6VZPN Inject 1.5 mg into the skin every 7 (seven) days. 08/13/17  Yes GreWendie AgresteD  glimepiride (AMARYL) 2 MG tablet Take 1 tablet (2 mg total) by mouth daily before breakfast. 08/13/17  Yes GreWendie AgresteD  glimepiride (AMARYL) 4 MG tablet Take 1 tablet (4 mg total) by mouth daily with breakfast. 08/13/17  Yes GreWendie AgresteD  glucose blood test strip accu check aviva plus - check BID (uncontrolled diabetes, on sulfonyurea with reported hypoglycemia). 04/07/17  Yes GreWendie AgresteD  losartan-hydrochlorothiazide (HYZAAR) 100-25 MG tablet Take 1 tablet by mouth daily. 08/13/17  Yes GreWendie AgresteD  metFORMIN (GLUCOPHAGE) 1000  MG tablet Take 1 tablet (1,000 mg total) by mouth 2 (two) times daily with a meal. 08/13/17  Yes [name], MD  omeprazole (PRILOSEC) 20 MG capsule Take 1 capsule (20 mg total) by mouth daily. 08/13/17  Yes [name], MD  sildenafil (VIAGRA) 100 MG tablet  11/26/17  Yes [provider]  simvastatin (ZOCOR) 20 MG tablet Take 1 tablet (20 mg total) by mouth every evening. 05/14/17  Yes [name], MD  tamsulosin (FLOMAX) 0.4 MG CAPS capsule Take 1 capsule (0.4 mg total) by mouth daily. 06/05/16  Yes Timmothy Euler, MD   Social History   Socioeconomic  History  . Marital status: Married    Spouse name: Not on file  . Number of children: 3  . Years of education: Not on file  . Highest education level: Not on file  Occupational History  . Occupation: Airline pilot: [employer]  . Financial resource strain: Not on file  . Food insecurity:    Worry: Not on file    Inability: Not on file  . Transportation needs:    Medical: Not on file    Non-medical: Not on file  Tobacco Use  . Smoking status: Never Smoker  . Smokeless tobacco: Never Used  Substance and Sexual Activity  . Alcohol use: No  . Drug use: No  . Sexual activity: Yes  Lifestyle  . Physical activity:    Days per week: Not on file    Minutes per session: Not on file  . Stress: Not on file  Relationships  . Social connections:    Talks on phone: Not on file    Gets together: Not on file    Attends religious service: Not on file    Active member of club or organization: Not on file    Attends meetings of clubs or organizations: Not on file    Relationship status: Not on file  . Intimate partner violence:    Fear of current or ex partner: Not on file    Emotionally abused: Not on file    Physically abused: Not on file    Forced sexual activity: Not on file  Other Topics Concern  . Not on file  Social History Narrative   Married. Education: [employer]. Exercise: Walks twice a week for 1-2 hours.   Review of Systems  Constitutional: Negative for fatigue and unexpected weight change.  Eyes: Negative for visual disturbance.  Respiratory: Negative for cough, chest tightness and shortness of breath.   Cardiovascular: Negative for chest pain, palpitations and leg swelling.  Gastrointestinal: Negative for abdominal pain and blood in stool.  Neurological: Negative for dizziness, light-headedness and headaches.       Objective:   Physical Exam  Constitutional: He is oriented to person, place, and time. He appears well-developed and well-nourished.   HENT:  Head: Normocephalic and atraumatic.  Eyes: Pupils are equal, round, and reactive to light. EOM are normal.  Neck: No JVD present. Carotid bruit is not present.  Cardiovascular: Normal rate, regular rhythm and normal heart sounds.  No murmur heard. Pulmonary/Chest: [place] normal and breath sounds normal. He has no rales.  Musculoskeletal: He exhibits no edema.  Neurological: He is alert and oriented to person, place, and time.  Skin: Skin is warm and dry.  Psychiatric: He has a normal mood and affect.  Vitals reviewed.   Vitals:   12/23/17 1357  BP: 122/86  Pulse: 75  Resp:  16  Temp: 98 F (36.7 C)  TempSrc: Oral  SpO2: 98%  Weight: 207 lb (93.9 kg)  Height: 5' 10"  (1.778 m)   Diabetic Foot Exam - Simple   Simple Foot Form Visual Inspection See comments:  Yes Sensation Testing Intact to touch and monofilament testing bilaterally:  Yes Pulse Check Posterior Tibialis and Dorsalis pulse intact bilaterally:  Yes Comments Pt pinky toe on the right foot is black.  There is also some callus         Assessment & Plan:  Wayne Perez is a 57 y.o. male Type 2 diabetes mellitus without complication, without long-term current use of insulin ([place]) - Plan: Microalbumin, urine, Hemoglobin A1c  -Tolerating current regimen, check A1c and microalbumin.  Continue Trulicity, Amaryl and metformin.  Continue statin.  Recheck 3 months  Encounter for hepatitis C screening test for low risk patient - Plan: Hepatitis C antibody  Screening for HIV (human immunodeficiency virus) - Plan: HIV antibody  Hyperlipidemia, unspecified hyperlipidemia type - Plan: Comprehensive metabolic panel, Lipid panel  -Tolerating Zocor current dose, check labs as above.  No orders of the defined types were placed in this encounter.  Patient Instructions    No change in medications for now.  I will let you know the results of your blood work and decide if any diabetic changes needed, otherwise  follow-up in 3 months.  Thank you for coming in today.   IF you received an x-ray today, you will receive an invoice from [facility] Radiology. Please contact [facility] Radiology at [phone] with questions or concerns regarding your invoice.   IF you received labwork today, you will receive an invoice from [place]. Please contact [facility] at [phone] with questions or concerns regarding your invoice.   Our billing staff will not be able to assist you with questions regarding bills from these companies.  You will be contacted with the lab results as soon as they are available. The fastest way to get your results is to activate your My Chart account. Instructions are located on the last page of this paperwork. If you have not heard from [place] regarding the results in 2 weeks, please contact this office.       I personally performed the services described in this documentation, which was scribed in my presence. The recorded information has been reviewed and considered for accuracy and completeness, addended by me as needed, and agree with information above.  Signed,   [name], MD [facility] [place].  12/25/17 4:11 PM

## 2017-12-24 ENCOUNTER — Telehealth: Payer: Self-pay | Admitting: Family Medicine

## 2017-12-24 DIAGNOSIS — E119 Type 2 diabetes mellitus without complications: Secondary | ICD-10-CM

## 2017-12-24 LAB — HEPATITIS C ANTIBODY

## 2017-12-24 LAB — COMPREHENSIVE METABOLIC PANEL
ALBUMIN: 4.2 g/dL (ref 3.5–5.5)
ALT: 25 IU/L (ref 0–44)
AST: 24 IU/L (ref 0–40)
Albumin/Globulin Ratio: 1.4 (ref 1.2–2.2)
Alkaline Phosphatase: 64 IU/L (ref 39–117)
BUN / CREAT RATIO: 12 (ref 9–20)
BUN: 13 mg/dL (ref 6–24)
Bilirubin Total: 0.4 mg/dL (ref 0.0–1.2)
CALCIUM: 9.5 mg/dL (ref 8.7–10.2)
CO2: 24 mmol/L (ref 20–29)
CREATININE: 1.05 mg/dL (ref 0.76–1.27)
Chloride: 100 mmol/L (ref 96–106)
GFR calc Af Amer: 93 mL/min/{1.73_m2} (ref >59)
GFR, EST NON AFRICAN AMERICAN: 80 mL/min/{1.73_m2} (ref >59)
Globulin, Total: 3.1 g/dL (ref 1.5–4.5)
Glucose: 78 mg/dL (ref 65–99)
Potassium: 4.2 mmol/L (ref 3.5–5.2)
Sodium: 139 mmol/L (ref 134–144)
Total Protein: 7.3 g/dL (ref 6.0–8.5)

## 2017-12-24 LAB — HEMOGLOBIN A1C
Est. average glucose Bld gHb Est-mCnc: 160 mg/dL
Hgb A1c MFr Bld: 7.2 % — ABNORMAL HIGH (ref 4.8–5.6)

## 2017-12-24 LAB — LIPID PANEL
CHOL/HDL RATIO: 3.2 ratio (ref 0.0–5.0)
Cholesterol, Total: 146 mg/dL (ref 100–199)
HDL: 45 mg/dL (ref >39)
LDL CALC: 67 mg/dL (ref 0–99)
Triglycerides: 168 mg/dL — ABNORMAL HIGH (ref 0–149)
VLDL Cholesterol Cal: 34 mg/dL (ref 5–40)

## 2017-12-24 LAB — HIV ANTIBODY (ROUTINE TESTING W REFLEX): HIV SCREEN 4TH GENERATION: NONREACTIVE

## 2017-12-24 LAB — MICROALBUMIN, URINE: MICROALBUM., U, RANDOM: 9.5 ug/mL

## 2017-12-26 NOTE — Telephone Encounter (Signed)
Advised pharmacy.

## 2017-12-26 NOTE — Telephone Encounter (Signed)
Wayne Perez called and spoke to [place], Education administrator about the interface request for Amaryl 2 mg. I asked is it showing that it was last sent for a fill on 08/13/17 and a 4 mg tablet filled on 08/13/17. She says they have one on hold for 2 mg bid and the 4 mg daily. I advised it is profiled in her chart 2 mg daily and 4 mg daily and I will get clarification on the correct dosage to correct in their system. The interface request for Amaryl 2 mg daily was filled. Medication dosages need to be clarified and medication profile updated to reflect the change. Last OV note 12/23/17 indicates to continue Amaryl, but no dosage listed.

## 2017-12-26 NOTE — Telephone Encounter (Signed)
Just want to verify that you would like pt to be on total dose of 6mg  of amaryl? States to continue same dose in 12/8 visit but no strength given.

## 2017-12-26 NOTE — Telephone Encounter (Signed)
Correct, 6 mg Amaryl.  Thanks

## 2018-01-22 ENCOUNTER — Other Ambulatory Visit: Payer: Self-pay | Admitting: Family Medicine

## 2018-01-22 DIAGNOSIS — E785 Hyperlipidemia, unspecified: Secondary | ICD-10-CM

## 2018-02-19 ENCOUNTER — Other Ambulatory Visit: Payer: Self-pay | Admitting: Family Medicine

## 2018-02-19 DIAGNOSIS — E785 Hyperlipidemia, unspecified: Secondary | ICD-10-CM

## 2018-02-19 DIAGNOSIS — E119 Type 2 diabetes mellitus without complications: Secondary | ICD-10-CM

## 2018-02-21 NOTE — Telephone Encounter (Signed)
Interface request for refill of:   Amaryl 4mg . Last refill 01/22/18  Metformin 1000 mg last refill 01/22/18  Zocor last refill 01/23/18    Lov with Corliss Parish MD on 12/23/17    Pharmacy  [facility]

## 2018-03-22 ENCOUNTER — Other Ambulatory Visit: Payer: Self-pay | Admitting: Family Medicine

## 2018-03-22 DIAGNOSIS — E785 Hyperlipidemia, unspecified: Secondary | ICD-10-CM

## 2018-04-20 ENCOUNTER — Other Ambulatory Visit: Payer: Self-pay

## 2018-04-20 ENCOUNTER — Ambulatory Visit: Payer: Managed Care, Other (non HMO) | Admitting: Family Medicine

## 2018-04-20 ENCOUNTER — Encounter: Payer: Self-pay | Admitting: Family Medicine

## 2018-04-20 VITALS — BP 112/78 | HR 94 | Temp 98.7°F | Ht 67.0 in | Wt 206.2 lb

## 2018-04-20 DIAGNOSIS — E119 Type 2 diabetes mellitus without complications: Secondary | ICD-10-CM | POA: Diagnosis not present

## 2018-04-20 DIAGNOSIS — E785 Hyperlipidemia, unspecified: Secondary | ICD-10-CM | POA: Diagnosis not present

## 2018-04-20 LAB — HEMOGLOBIN A1C
Est. average glucose Bld gHb Est-mCnc: 163 mg/dL
Hgb A1c MFr Bld: 7.3 % — ABNORMAL HIGH (ref 4.8–5.6)

## 2018-04-20 MED ORDER — SIMVASTATIN 20 MG PO TABS: 20.0000 mg | ORAL_TABLET | Freq: Every evening | ORAL | 1 refills | Status: DC

## 2018-04-20 MED ORDER — DULAGLUTIDE 1.5 MG/0.5ML ~~LOC~~ SOAJ: 1.5000 mg | SUBCUTANEOUS | 2 refills | Status: DC

## 2018-04-20 MED ORDER — GLIMEPIRIDE 2 MG PO TABS: 2.0000 mg | ORAL_TABLET | Freq: Every day | ORAL | 1 refills | Status: DC

## 2018-04-20 MED ORDER — GLIMEPIRIDE 4 MG PO TABS: ORAL_TABLET | ORAL | 1 refills | Status: DC

## 2018-04-20 MED ORDER — METFORMIN HCL 1000 MG PO TABS: 1000.0000 mg | ORAL_TABLET | Freq: Two times a day (BID) | ORAL | 1 refills | Status: DC

## 2018-04-20 NOTE — Progress Notes (Signed)
Subjective:  By signing my name below, I, [name], attest that this documentation has been prepared under the direction and in the presence of [name], MD Electronically Signed: Ladene Artist, ED Scribe 04/20/2018 at 10:18 AM.   Patient ID: Wayne Perez, male    DOB: [birth year], 57 y.o.   MRN: [id number]  Chief Complaint  Patient presents with  . Diabetes    3 f/u    HPI Wayne Perez is a 57 y.o. male who presents to Primary Care at [facility] for DM f/u.  DM Lab Results  Component Value Date   HGBA1C 7.2 (H) 12/23/2017  Takes 4 mg Amaryl at breakfast and 2 mg at night, trulicity 1.5 mg q/wk, metformin 1000 mg bid. On a statin, ARB and asa. Optho: 12/20/16, appointment scheduled for next month. Foot exam 12/23/17. Pneumovax 02/02/17. Urine micro 12/23/17. - Pt does report 1 episode where he felt his blood glucose was low within the past 3 months at nighttime, however, he did not actually check his blood glucose. Denies skipping a meal at that time. Denies numbness/tingling in feet, cp, sob, lightheadedness, dizziness, new side-effects from meds.  Patient Active Problem List   Diagnosis Date Noted  . Erectile dysfunction due to arterial insufficiency 08/07/2015  . Varicocele 08/07/2015  . Diabetes ([place]) 07/31/2012  . HTN (hypertension) 07/31/2012   Past Medical History:  Diagnosis Date  . Diabetes mellitus without complication ([place])   . GERD (gastroesophageal reflux disease)   . Hyperlipidemia   . Hypertension    Past Surgical History:  Procedure Laterality Date  . COLONOSCOPY     Allergies  Allergen Reactions  . Ace Inhibitors Itching   Prior to Admission medications   Medication Sig Start Date End Date Taking? Authorizing Provider  aspirin 81 MG tablet Take 81 mg by mouth daily.   Yes [provider]  Blood Glucose Monitoring Suppl KIT One Touch Ultra or may substitute for what insurance covers.  May check once per day 09/09/14  Yes [name], MD    Dulaglutide (TRULICITY) 1.5 BB/0.4UG SOPN Inject 1.5 mg into the skin every 7 (seven) days. 08/13/17  Yes [name], MD  glimepiride (AMARYL) 2 MG tablet TAKE 1 TABLET BY MOUTH ONCE A DAY 12/26/17  Yes [name], MD  glimepiride (AMARYL) 4 MG tablet TAKE 1 TABLET BY MOUTH EVERY DAY WITH BREAKFAST 02/21/18  Yes [name], MD  glucose blood test strip accu check aviva plus - check BID (uncontrolled diabetes, on sulfonyurea with reported hypoglycemia). 04/07/17  Yes [name], MD  losartan-hydrochlorothiazide (HYZAAR) 100-25 MG tablet Take 1 tablet by mouth daily. 08/13/17  Yes [name], MD  metFORMIN (GLUCOPHAGE) 1000 MG tablet TAKE 1 TABLET BY MOUTH TWICE DAILY WITH MEALS 02/21/18  Yes [name], MD  omeprazole (PRILOSEC) 20 MG capsule Take 1 capsule (20 mg total) by mouth daily. 08/13/17  Yes [name], MD  sildenafil (VIAGRA) 100 MG tablet  11/26/17  Yes [provider]  simvastatin (ZOCOR) 20 MG tablet TAKE 1 TABLET BY MOUTH EVERY EVENING 03/22/18  Yes [name], MD  tamsulosin (FLOMAX) 0.4 MG CAPS capsule Take 1 capsule (0.4 mg total) by mouth daily. 06/05/16  Yes Timmothy Euler, MD   Social History   Socioeconomic History  . Marital status: Married    Spouse name: Not on file  . Number of children: 3  . Years of education: Not on file  .  Highest education level: Not on file  Occupational History  . Occupation: Airline pilot: [employer]  . Financial resource strain: Not on file  . Food insecurity:    Worry: Not on file    Inability: Not on file  . Transportation needs:    Medical: Not on file    Non-medical: Not on file  Tobacco Use  . Smoking status: Never Smoker  . Smokeless tobacco: Never Used  Substance and Sexual Activity  . Alcohol use: No  . Drug use: No  . Sexual activity: Yes  Lifestyle  . Physical activity:    Days per week: Not on file    Minutes per session: Not on file  .  Stress: Not on file  Relationships  . Social connections:    Talks on phone: Not on file    Gets together: Not on file    Attends religious service: Not on file    Active member of club or organization: Not on file    Attends meetings of clubs or organizations: Not on file    Relationship status: Not on file  . Intimate partner violence:    Fear of current or ex partner: Not on file    Emotionally abused: Not on file    Physically abused: Not on file    Forced sexual activity: Not on file  Other Topics Concern  . Not on file  Social History Narrative   Married. Education: [employer]. Exercise: Walks twice a week for 1-2 hours.   Review of Systems  Constitutional: Negative for fatigue and unexpected weight change.  Eyes: Negative for visual disturbance.  Respiratory: Negative for cough, chest tightness and shortness of breath.   Cardiovascular: Negative for chest pain, palpitations and leg swelling.  Gastrointestinal: Negative for abdominal pain and blood in stool.  Neurological: Negative for dizziness, light-headedness, numbness and headaches.      Objective:   Physical Exam  Constitutional: He is oriented to person, place, and time. He appears well-developed and well-nourished.  HENT:  Head: Normocephalic and atraumatic.  Eyes: Pupils are equal, round, and reactive to light. EOM are normal.  Neck: No JVD present. Carotid bruit is not present.  Cardiovascular: Normal rate, regular rhythm and normal heart sounds.  No murmur heard. Pulmonary/Chest: [place] normal and breath sounds normal. He has no rales.  Musculoskeletal: He exhibits no edema.  Neurological: He is alert and oriented to person, place, and time.  Skin: Skin is warm and dry.  Psychiatric: He has a normal mood and affect.  Vitals reviewed.  Vitals:   04/20/18 1004  BP: 112/78  Pulse: 94  Temp: 98.7 F (37.1 C)  TempSrc: Oral  SpO2: 100%  Weight: 206 lb 3.2 oz (93.5 kg)  Height: 5' 7" (1.702 m)        Assessment & Plan:   Wayne Perez is a 57 y.o. male Type 2 diabetes mellitus without complication, without long-term current use of insulin (HCC) - Plan: Hemoglobin A1c, metFORMIN (GLUCOPHAGE) 1000 MG tablet, glimepiride (AMARYL) 4 MG tablet, glimepiride (AMARYL) 2 MG tablet, Dulaglutide (TRULICITY) 1.5 XB/2.8UX SOPN  -  Stable, tolerating current regimen. Medications refilled. Labs pending as above.   - keep follow up with optho as planned.   - rtc precautions if hypoglycemic symptoms return.  Hyperlipidemia, unspecified hyperlipidemia type - Plan: simvastatin (ZOCOR) 20 MG tablet  -, Plan for fasting labs next visit.  Refilled meds.   Meds ordered this encounter  Medications  .  simvastatin (ZOCOR) 20 MG tablet    Sig: Take 1 tablet (20 mg total) by mouth every evening.    Dispense:  90 tablet    Refill:  1  . metFORMIN (GLUCOPHAGE) 1000 MG tablet    Sig: Take 1 tablet (1,000 mg total) by mouth 2 (two) times daily with a meal.    Dispense:  180 tablet    Refill:  1  . glimepiride (AMARYL) 4 MG tablet    Sig: TAKE 1 TABLET BY MOUTH EVERY DAY WITH BREAKFAST    Dispense:  90 tablet    Refill:  1  . glimepiride (AMARYL) 2 MG tablet    Sig: Take 1 tablet (2 mg total) by mouth daily.    Dispense:  90 tablet    Refill:  1  . Dulaglutide (TRULICITY) 1.5 GX/2.1JH SOPN    Sig: Inject 1.5 mg into the skin every 7 (seven) days.    Dispense:  6 mL    Refill:  2   Patient Instructions     IF any return of low blood sugar symptoms, return to discuss med changes.  No changes for now. Recheck in 3 months for fasting labs at that time. Thanks for coming in today.   I will contact you with your lab results within the next 2 weeks.  If you have not heard from [place] then please contact us. The fastest way to get your results is to register for My Chart.   IF you received an x-ray today, you will receive an invoice from [facility] Radiology. Please contact [facility] Radiology at [phone]  with questions or concerns regarding your invoice.   IF you received labwork today, you will receive an invoice from [facility]. Please contact [facility] at [phone] with questions or concerns regarding your invoice.   Our billing staff will not be able to assist you with questions regarding bills from these companies.  You will be contacted with the lab results as soon as they are available. The fastest way to get your results is to activate your My Chart account. Instructions are located on the last page of this paperwork. If you have not heard from [place] regarding the results in 2 weeks, please contact this office.       I personally performed the services described in this documentation, which was scribed in my presence. The recorded information has been reviewed and considered for accuracy and completeness, addended by me as needed, and agree with information above.  Signed,   [name], MD [facility] at [facility].  04/20/18 10:31 AM

## 2018-04-20 NOTE — Patient Instructions (Addendum)
   IF any return of low blood sugar symptoms, return to discuss med changes.  No changes for now. Recheck in 3 months for fasting labs at that time. Thanks for coming in today.   I will contact you with your lab results within the next 2 weeks.  If you have not heard from [place] then please contact us. The fastest way to get your results is to register for My Chart.   IF you received an x-ray today, you will receive an invoice from [facility] Radiology. Please contact [facility] Radiology at [phone] with questions or concerns regarding your invoice.   IF you received labwork today, you will receive an invoice from [place]. Please contact [facility] at [phone] with questions or concerns regarding your invoice.   Our billing staff will not be able to assist you with questions regarding bills from these companies.  You will be contacted with the lab results as soon as they are available. The fastest way to get your results is to activate your My Chart account. Instructions are located on the last page of this paperwork. If you have not heard from [place] regarding the results in 2 weeks, please contact this office.

## 2018-05-10 ENCOUNTER — Other Ambulatory Visit: Payer: Self-pay | Admitting: Family Medicine

## 2018-05-10 DIAGNOSIS — I10 Essential (primary) hypertension: Secondary | ICD-10-CM

## 2018-05-10 NOTE — Telephone Encounter (Signed)
Call to patient to verify that he is still taking BP medication- he states he just picked Rx up today. Patient states he will need another refill before his appointment that is due in December.

## 2018-06-01 ENCOUNTER — Other Ambulatory Visit: Payer: Self-pay | Admitting: Urology

## 2018-06-01 DIAGNOSIS — C61 Malignant neoplasm of prostate: Secondary | ICD-10-CM

## 2018-06-16 ENCOUNTER — Ambulatory Visit (HOSPITAL_COMMUNITY)
Admission: RE | Admit: 2018-06-16 | Discharge: 2018-06-16 | Disposition: A | Discharge status: Home / Self Care | Payer: Managed Care, Other (non HMO) | Source: Ambulatory Visit | Attending: Urology | Admitting: Urology

## 2018-06-16 DIAGNOSIS — C61 Malignant neoplasm of prostate: Secondary | ICD-10-CM | POA: Diagnosis not present

## 2018-06-16 MED ORDER — TECHNETIUM TC 99M MEDRONATE IV KIT
22.0000 | PACK | Freq: Once | INTRAVENOUS | Status: AC | PRN
  Administered 2018-06-16: 22 via INTRAVENOUS

## 2018-07-22 ENCOUNTER — Other Ambulatory Visit: Payer: Self-pay | Admitting: Family Medicine

## 2018-07-22 DIAGNOSIS — E785 Hyperlipidemia, unspecified: Secondary | ICD-10-CM

## 2018-07-22 DIAGNOSIS — I10 Essential (primary) hypertension: Secondary | ICD-10-CM

## 2018-07-22 DIAGNOSIS — E119 Type 2 diabetes mellitus without complications: Secondary | ICD-10-CM

## 2018-07-22 DIAGNOSIS — K219 Gastro-esophageal reflux disease without esophagitis: Secondary | ICD-10-CM

## 2018-07-22 MED ORDER — LOSARTAN POTASSIUM-HCTZ 50-12.5 MG PO TABS: 2.0000 | ORAL_TABLET | Freq: Every day | ORAL | 0 refills | Status: DC

## 2018-07-22 NOTE — Telephone Encounter (Signed)
Received a call from pharmacy and changed the HYZAAR to a lower dose but take twice as much so his is on the recommend dose of medication.   I have refilled the other meds in system pending but he will be coming back on Dec for follow up.  Thanks, [employer]

## 2018-08-28 ENCOUNTER — Telehealth: Payer: Self-pay | Admitting: Family Medicine

## 2018-08-28 NOTE — Telephone Encounter (Signed)
LVM for pt to call the office to schedule a pre-surgical clearance per CRM#[id number]. If pt calls back, please call [place] and ask for Lavella Lemons so I may open a sameday.   Thank you!

## 2018-08-29 NOTE — Telephone Encounter (Signed)
Patient called back to schedule. No availability. Office closed for holiday. Advised patient he would receive a call back as soon as office reopens. Please advise.

## 2018-08-31 NOTE — Telephone Encounter (Signed)
Tried to call pt back to schedule his appt. LVM for pt to call office and schedule. If pt calls back, please call [place] and ask for Tanya. Thank you!

## 2018-08-31 NOTE — Telephone Encounter (Signed)
Spoke with pt. Was bale to get him scheduled for 09/08/18 for his pre-surgery clearance per Dr. Carlota Raspberry.  I advised of time, building number and late policy. Pt acknowledged.

## 2018-09-07 DIAGNOSIS — E119 Type 2 diabetes mellitus without complications: Secondary | ICD-10-CM | POA: Insufficient documentation

## 2018-09-08 ENCOUNTER — Ambulatory Visit: Payer: Self-pay | Admitting: *Deleted

## 2018-09-08 ENCOUNTER — Other Ambulatory Visit: Payer: Self-pay

## 2018-09-08 ENCOUNTER — Ambulatory Visit: Payer: Managed Care, Other (non HMO) | Admitting: Family Medicine

## 2018-09-08 ENCOUNTER — Encounter: Payer: Self-pay | Admitting: Family Medicine

## 2018-09-08 VITALS — BP 124/84 | HR 83 | Temp 98.3°F | Ht 67.0 in | Wt 207.0 lb

## 2018-09-08 DIAGNOSIS — E119 Type 2 diabetes mellitus without complications: Secondary | ICD-10-CM

## 2018-09-08 DIAGNOSIS — E785 Hyperlipidemia, unspecified: Secondary | ICD-10-CM

## 2018-09-08 DIAGNOSIS — I10 Essential (primary) hypertension: Secondary | ICD-10-CM | POA: Diagnosis not present

## 2018-09-08 LAB — POCT GLYCOSYLATED HEMOGLOBIN (HGB A1C): HEMOGLOBIN A1C: 7.5 % — AB (ref 4.0–5.6)

## 2018-09-08 MED ORDER — METFORMIN HCL 1000 MG PO TABS: ORAL_TABLET | ORAL | 1 refills | Status: DC

## 2018-09-08 MED ORDER — SIMVASTATIN 20 MG PO TABS: ORAL_TABLET | ORAL | 1 refills | Status: DC

## 2018-09-08 MED ORDER — GLIMEPIRIDE 4 MG PO TABS: 4.0000 mg | ORAL_TABLET | Freq: Every day | ORAL | 1 refills | Status: DC

## 2018-09-08 MED ORDER — GLIMEPIRIDE 2 MG PO TABS: ORAL_TABLET | ORAL | 1 refills | Status: DC

## 2018-09-08 MED ORDER — LOSARTAN POTASSIUM-HCTZ 50-12.5 MG PO TABS: 2.0000 | ORAL_TABLET | Freq: Every day | ORAL | 1 refills | Status: DC

## 2018-09-08 MED ORDER — DULAGLUTIDE 1.5 MG/0.5ML ~~LOC~~ SOAJ: 1.5000 mg | SUBCUTANEOUS | 2 refills | Status: DC

## 2018-09-08 NOTE — Telephone Encounter (Signed)
Summary: med question   Leann with [facility] on [address] calling to see if they can have approval to spilt the combination of losartan-hydrochlorothiazide (HYZAAR) 50-12.5 MG tablet into lostartan 50 MG and HCTZ 12.5 MG as the combination is on national back order? Please advise.       Request from pharmacy regarding Hyzaard 50-12.5 mg tab to split this medication.

## 2018-09-08 NOTE — Progress Notes (Signed)
Subjective:    Patient ID: Wayne Perez, male    DOB: Mar 26, 1961, 58 y.o.   MRN: [id number]  HPI Wayne Perez is a 58 y.o. male Presents today for: Chief Complaint  Patient presents with  . Surgical Clearance    prostat surgery on 09/14/2018    Presents for follow up. Has had preop eval including EKG last week for upcoming surgery - robotic prostatectomy scheduled in 6 days for prostate cancer. Followed by [facility] - Dr. Sherryll Burger.   No chest pain with exertion. No hx of heart disease known.  No dyspnea/cp with 2 flights of stairs.  No hx of OSA, sleeping ok.  RCRI/goldman index score of 0.   Hx of Diabetes.  Lab Results  Component Value Date   HGBA1C 7.3 (H) 04/20/2018  on amaryl 47m qd metformin 19735HGbid, trulicity 19.9MEQ week. Home readings 120-150's, no symptomatic lows. On ARB and statin.  Optho: last year.  microalbumin nl in 4/19.  Stopped ASA temporarily for surgery.   Hyperlipidemia:  Lab Results  Component Value Date   CHOL 146 12/23/2017   HDL 45 12/23/2017   LDLCALC 67 12/23/2017   TRIG 168 (H) 12/23/2017   CHOLHDL 3.2 12/23/2017   Lab Results  Component Value Date   ALT 25 12/23/2017   AST 24 12/23/2017   ALKPHOS 64 12/23/2017   BILITOT 0.4 12/23/2017  on Zocor 268mqd. No new side effects.   Hypertension: BP Readings from Last 3 Encounters:  09/08/18 124/84  04/20/18 112/78  12/23/17 122/86   Lab Results  Component Value Date   CREATININE 1.05 12/23/2017  on losartan hct.   Patient Active Problem List   Diagnosis Date Noted  . Erectile dysfunction due to arterial insufficiency 08/07/2015  . Varicocele 08/07/2015  . Diabetes ([facility]25/2013  . HTN (hypertension) 07/31/2012   Past Medical History:  Diagnosis Date  . Diabetes mellitus without complication ([facility]  . GERD (gastroesophageal reflux disease)   . Hyperlipidemia   . Hypertension    Past Surgical History:  Procedure Laterality Date  . COLONOSCOPY     Allergies    Allergen Reactions  . Ace Inhibitors Itching   Prior to Admission medications   Medication Sig Start Date End Date Taking? Authorizing Provider  aspirin 81 MG tablet Take 81 mg by mouth daily.   Yes [provider]  Blood Glucose Monitoring Suppl KIT One Touch Ultra or may substitute for what insurance covers.  May check once per day 09/09/14  Yes GrWendie AgresteMD  Dulaglutide (TRULICITY) 1.5 MGQA/8.3MHOPN Inject 1.5 mg into the skin every 7 (seven) days. 04/20/18  Yes GrWendie AgresteMD  glimepiride (AMARYL) 2 MG tablet TAKE 1 TABLET(2 MG) BY MOUTH DAILY 07/22/18  Yes GrWendie AgresteMD  glimepiride (AMARYL) 4 MG tablet TAKE 1 TABLET BY MOUTH EVERY DAY WITH BREAKFAST 07/22/18  Yes GrWendie AgresteMD  glucose blood test strip accu check aviva plus - check BID (uncontrolled diabetes, on sulfonyurea with reported hypoglycemia). 04/07/17  Yes GrWendie AgresteMD  losartan-hydrochlorothiazide (HYZAAR) 100-25 MG tablet TAKE 1 TABLET BY MOUTH EVERY DAY 05/10/18  Yes GrWendie AgresteMD  losartan-hydrochlorothiazide (HYZAAR) 50-12.5 MG tablet Take 2 tablets by mouth daily. 07/22/18  Yes GrWendie AgresteMD  metFORMIN (GLUCOPHAGE) 1000 MG tablet TAKE 1 TABLET(1000 MG) BY MOUTH TWICE DAILY WITH A MEAL 07/22/18  Yes GrWendie AgresteMD  omeprazole (PRILOSEC) 20 MG capsule TAKE 1 CAPSULE  BY MOUTH ONCE A DAY 07/22/18  Yes [name], MD  sildenafil (VIAGRA) 100 MG tablet  11/26/17  Yes [provider]  simvastatin (ZOCOR) 20 MG tablet TAKE 1 TABLET(20 MG) BY MOUTH EVERY EVENING 07/22/18  Yes [name], MD  tamsulosin (FLOMAX) 0.4 MG CAPS capsule Take 1 capsule (0.4 mg total) by mouth daily. 06/05/16  Yes Timmothy Euler, MD   Social History   Socioeconomic History  . Marital status: Married    Spouse name: Not on file  . Number of children: 3  . Years of education: Not on file  . Highest education level: Not on file  Occupational History  .  Occupation: Airline pilot: [employer]  . Financial resource strain: Not on file  . Food insecurity:    Worry: Not on file    Inability: Not on file  . Transportation needs:    Medical: Not on file    Non-medical: Not on file  Tobacco Use  . Smoking status: Never Smoker  . Smokeless tobacco: Never Used  Substance and Sexual Activity  . Alcohol use: No  . Drug use: No  . Sexual activity: Yes  Lifestyle  . Physical activity:    Days per week: Not on file    Minutes per session: Not on file  . Stress: Not on file  Relationships  . Social connections:    Talks on phone: Not on file    Gets together: Not on file    Attends religious service: Not on file    Active member of club or organization: Not on file    Attends meetings of clubs or organizations: Not on file    Relationship status: Not on file  . Intimate partner violence:    Fear of current or ex partner: Not on file    Emotionally abused: Not on file    Physically abused: Not on file    Forced sexual activity: Not on file  Other Topics Concern  . Not on file  Social History Narrative   Married. Education: [employer]. Exercise: Walks twice a week for 1-2 hours.    Review of Systems  Constitutional: Negative for fatigue and unexpected weight change.  Eyes: Negative for visual disturbance.  Respiratory: Negative for cough, chest tightness and shortness of breath.   Cardiovascular: Negative for chest pain, palpitations and leg swelling.  Gastrointestinal: Negative for abdominal pain and blood in stool.  Neurological: Negative for dizziness, light-headedness and headaches.       Objective:   Physical Exam Vitals signs reviewed.  Constitutional:      Appearance: He is well-developed.  HENT:     Head: Normocephalic and atraumatic.  Eyes:     Pupils: Pupils are equal, round, and reactive to light.  Neck:     Vascular: No carotid bruit or JVD.  Cardiovascular:     Rate and Rhythm: Normal rate and  regular rhythm.     Heart sounds: Normal heart sounds. No murmur.  Pulmonary:     Effort: Pulmonary effort is normal.     Breath sounds: Normal breath sounds. No rales.  Skin:    General: Skin is warm and dry.  Neurological:     Mental Status: He is alert and oriented to person, place, and time.    Vitals:   09/08/18 1132  BP: 124/84  Pulse: 83  Temp: 98.3 F (36.8 C)  TempSrc: Oral  SpO2: 100%  Weight: 207 lb (  93.9 kg)  Height: _0  (1.702 m)      Results for orders placed or performed in visit on 09/08/18  POCT glycosylated hemoglobin (Hb A1C)  Result Value Ref Range   Hemoglobin A1C 7.5 (A) 4.0 - 5.6 %   HbA1c POC (<> result, manual entry)     HbA1c, POC (prediabetic range)     HbA1c, POC (controlled diabetic range)         Assessment & Plan:   Wayne Perez is a 58 y.o. male Hyperlipidemia, unspecified hyperlipidemia type - Plan: Comprehensive metabolic panel, simvastatin (ZOCOR) 20 MG tablet  -  Stable, tolerating current regimen. Medications refilled. Labs pending as above.   Type 2 diabetes mellitus without complication, without long-term current use of insulin (HCC) - Plan: POCT glycosylated hemoglobin (Hb A1C), Dulaglutide (TRULICITY) 1.5 HQ/4.6NG SOPN, glimepiride (AMARYL) 2 MG tablet, glimepiride (AMARYL) 4 MG tablet, metFORMIN (GLUCOPHAGE) 1000 MG tablet  - above goal, but contributes to some recent stress.  With upcoming surgery decided against any increase medication, and actually discussed not using glimepiride if he is not eating a normal diet.  Plan to have him seen home readings in the next 3 to 4 weeks with fasting and 2-hour postprandials to decide on further changes with repeat office visit 3 months  Essential hypertension - Plan: Comprehensive metabolic panel, Lipid panel  -  Stable, tolerating current regimen. Medications refilled. Labs pending as above.    Meds ordered this encounter  Medications  . Dulaglutide (TRULICITY) 1.5 EX/5.2WU SOPN     Sig: Inject 1.5 mg into the skin every 7 (seven) days.    Dispense:  6 mL    Refill:  2  . glimepiride (AMARYL) 2 MG tablet    Sig: TAKE 1 TABLET(2 MG) BY MOUTH DAILY    Dispense:  90 tablet    Refill:  1  . glimepiride (AMARYL) 4 MG tablet    Sig: Take 1 tablet (4 mg total) by mouth daily with breakfast.    Dispense:  90 tablet    Refill:  1  . losartan-hydrochlorothiazide (HYZAAR) 50-12.5 MG tablet    Sig: Take 2 tablets by mouth daily.    Dispense:  180 tablet    Refill:  1  . metFORMIN (GLUCOPHAGE) 1000 MG tablet    Sig: TAKE 1 TABLET(1000 MG) BY MOUTH TWICE DAILY WITH A MEAL    Dispense:  180 tablet    Refill:  1  . simvastatin (ZOCOR) 20 MG tablet    Sig: TAKE 1 TABLET(20 MG) BY MOUTH EVERY EVENING    Dispense:  90 tablet    Refill:  1   Patient Instructions   Good luck on upcoming surgery.  No change in meds for now, but let me know of some glucose readings after your surgery (fasting, or 2 hours after meals), and we can decide in changes at that time.   If you have lab work done today you will be contacted with your lab results within the next 2 weeks.  If you have not heard from [place] then please contact us. The fastest way to get your results is to register for My Chart.   IF you received an x-ray today, you will receive an invoice from [facility]. Please contact [facility] at [phone] with questions or concerns regarding your invoice.   IF you received labwork today, you will receive an invoice from [place]. Please contact [facility] at [phone] with questions or concerns regarding your invoice.  Our billing staff will not be able to assist you with questions regarding bills from these companies.  You will be contacted with the lab results as soon as they are available. The fastest way to get your results is to activate your My Chart account. Instructions are located on the last page of this paperwork. If you have not heard from [place] regarding  the results in 2 weeks, please contact this office.       Signed,   [name], MD [facility] [place].  09/08/18 1:21 PM

## 2018-09-08 NOTE — Patient Instructions (Addendum)
Good luck on upcoming surgery.  No change in meds for now, but let me know of some glucose readings after your surgery (fasting, or 2 hours after meals), and we can decide in changes at that time.   If you have lab work done today you will be contacted with your lab results within the next 2 weeks.  If you have not heard from [place] then please contact us. The fastest way to get your results is to register for My Chart.   IF you received an x-ray today, you will receive an invoice from [facility]. Please contact [facility] at [phone] with questions or concerns regarding your invoice.   IF you received labwork today, you will receive an invoice from [place]. Please contact [facility] at [phone] with questions or concerns regarding your invoice.   Our billing staff will not be able to assist you with questions regarding bills from these companies.  You will be contacted with the lab results as soon as they are available. The fastest way to get your results is to activate your My Chart account. Instructions are located on the last page of this paperwork. If you have not heard from [place] regarding the results in 2 weeks, please contact this office.

## 2018-09-09 LAB — COMPREHENSIVE METABOLIC PANEL
ALBUMIN: 4.3 g/dL (ref 3.5–5.5)
ALK PHOS: 62 IU/L (ref 39–117)
ALT: 27 IU/L (ref 0–44)
AST: 28 IU/L (ref 0–40)
Albumin/Globulin Ratio: 1.3 (ref 1.2–2.2)
BILIRUBIN TOTAL: 0.3 mg/dL (ref 0.0–1.2)
BUN/Creatinine Ratio: 15 (ref 9–20)
BUN: 15 mg/dL (ref 6–24)
CALCIUM: 9.7 mg/dL (ref 8.7–10.2)
CHLORIDE: 100 mmol/L (ref 96–106)
CO2: 23 mmol/L (ref 20–29)
Creatinine, Ser: 0.99 mg/dL (ref 0.76–1.27)
GFR calc Af Amer: 99 mL/min/{1.73_m2} (ref >59)
GFR, EST NON AFRICAN AMERICAN: 85 mL/min/{1.73_m2} (ref >59)
Globulin, Total: 3.4 g/dL (ref 1.5–4.5)
Glucose: 96 mg/dL (ref 65–99)
Potassium: 4.7 mmol/L (ref 3.5–5.2)
SODIUM: 139 mmol/L (ref 134–144)
Total Protein: 7.7 g/dL (ref 6.0–8.5)

## 2018-09-09 LAB — LIPID PANEL
Chol/HDL Ratio: 3.2 ratio (ref 0.0–5.0)
Cholesterol, Total: 160 mg/dL (ref 100–199)
HDL: 50 mg/dL (ref >39)
LDL Calculated: 80 mg/dL (ref 0–99)
TRIGLYCERIDES: 151 mg/dL — AB (ref 0–149)
VLDL CHOLESTEROL CAL: 30 mg/dL (ref 5–40)

## 2018-09-14 ENCOUNTER — Telehealth: Payer: Self-pay | Admitting: Family Medicine

## 2018-09-14 NOTE — Telephone Encounter (Signed)
Copied from [place] [id number]. Topic: General - Other >> Sep 13, 2018  5:26 PM Yvette Rack wrote: Reason for CRM: Leann with [facility] on [address] called once again to see if they can have approval to spilt the combination of losartan-hydrochlorothiazide (HYZAAR) 50-12.5 MG tablet into lostartan 50 MG and HCTZ 12.5 MG. Cb# [phone]

## 2018-09-14 NOTE — Telephone Encounter (Signed)
pls see note 

## 2018-09-15 NOTE — Telephone Encounter (Signed)
Spoke with pharmacy about splitting Rx into two different tablets. Gave verbal order for Losartan 100 mg 1x day with count of 90 with 0 refill and Hydrochlorothiazide 25 mg 1x day count of 90 with 0 refill. She verbalized understanding.

## 2018-09-15 NOTE — Telephone Encounter (Signed)
Verbal order for losartan 100mg  qd and hctz 25mg  qd provided by [place] in my presence. Thanks .

## 2018-09-21 MED ORDER — INSULIN GLARGINE 100 UNIT/ML ~~LOC~~ SOLN
1.00 | SUBCUTANEOUS | Status: DC
Start: 2018-09-20 — End: 2018-09-21

## 2018-09-21 MED ORDER — ACETAMINOPHEN 500 MG PO TABS
1000.00 | ORAL_TABLET | ORAL | Status: DC
Start: ? — End: 2018-09-21

## 2018-09-21 MED ORDER — GENERIC EXTERNAL MEDICATION
1.00 | Status: DC
Start: ? — End: 2018-09-21

## 2018-09-21 MED ORDER — TEMAZEPAM 15 MG PO CAPS
15.00 | ORAL_CAPSULE | ORAL | Status: DC
Start: ? — End: 2018-09-21

## 2018-09-21 MED ORDER — OXYCODONE HCL 10 MG PO TABS
10.00 | ORAL_TABLET | ORAL | Status: DC
Start: ? — End: 2018-09-21

## 2018-09-21 MED ORDER — BELLADONNA ALKALOIDS-OPIUM 16.2-30 MG RE SUPP
30.00 | RECTAL | Status: DC
Start: ? — End: 2018-09-21

## 2018-09-21 MED ORDER — INSULIN LISPRO 100 UNIT/ML ~~LOC~~ SOLN
1.00 | SUBCUTANEOUS | Status: DC
Start: ? — End: 2018-09-21

## 2018-09-21 MED ORDER — MORPHINE SULFATE (PF) 4 MG/ML IV SOLN
4.00 | INTRAVENOUS | Status: DC
Start: ? — End: 2018-09-21

## 2018-09-21 MED ORDER — GENERIC EXTERNAL MEDICATION
10.00 | Status: DC
Start: ? — End: 2018-09-21

## 2018-09-21 MED ORDER — PRAVASTATIN SODIUM 40 MG PO TABS
40.00 | ORAL_TABLET | ORAL | Status: DC
Start: 2018-09-20 — End: 2018-09-21

## 2018-09-21 MED ORDER — INSULIN LISPRO 100 UNIT/ML ~~LOC~~ SOLN
1.00 | SUBCUTANEOUS | Status: DC
Start: 2018-09-20 — End: 2018-09-21

## 2018-09-21 MED ORDER — OXYBUTYNIN CHLORIDE 5 MG PO TABS
5.00 | ORAL_TABLET | ORAL | Status: DC
Start: ? — End: 2018-09-21

## 2018-09-21 MED ORDER — PHENAZOPYRIDINE HCL 100 MG PO TABS
100.00 | ORAL_TABLET | ORAL | Status: DC
Start: ? — End: 2018-09-21

## 2018-09-21 MED ORDER — ACETAMINOPHEN 325 MG PO TABS
650.00 | ORAL_TABLET | ORAL | Status: DC
Start: ? — End: 2018-09-21

## 2018-09-21 MED ORDER — OXYCODONE HCL 5 MG PO TABS
5.00 | ORAL_TABLET | ORAL | Status: DC
Start: ? — End: 2018-09-21

## 2018-09-21 MED ORDER — ONDANSETRON HCL 4 MG PO TABS
4.00 | ORAL_TABLET | ORAL | Status: DC
Start: ? — End: 2018-09-21

## 2018-09-21 MED ORDER — BISACODYL 10 MG RE SUPP
10.00 | RECTAL | Status: DC
Start: 2018-09-21 — End: 2018-09-21

## 2018-09-21 MED ORDER — POLYETHYLENE GLYCOL 3350 17 G PO PACK
17.00 | PACK | ORAL | Status: DC
Start: 2018-09-21 — End: 2018-09-21

## 2018-09-21 MED ORDER — ONDANSETRON HCL 4 MG/2ML IJ SOLN
4.00 | INTRAMUSCULAR | Status: DC
Start: ? — End: 2018-09-21

## 2018-09-21 MED ORDER — ENOXAPARIN SODIUM 40 MG/0.4ML ~~LOC~~ SOLN
40.00 | SUBCUTANEOUS | Status: DC
Start: 2018-09-21 — End: 2018-09-21

## 2018-09-21 MED ORDER — DIPHENHYDRAMINE HCL 25 MG PO CAPS
25.00 | ORAL_CAPSULE | ORAL | Status: DC
Start: ? — End: 2018-09-21

## 2018-09-21 MED ORDER — MORPHINE SULFATE (PF) 10 MG/ML IV SOLN
2.00 | INTRAVENOUS | Status: DC
Start: ? — End: 2018-09-21

## 2018-09-21 MED ORDER — PANTOPRAZOLE SODIUM 20 MG PO TBEC
20.00 | DELAYED_RELEASE_TABLET | ORAL | Status: DC
Start: 2018-09-21 — End: 2018-09-21

## 2018-09-21 MED ORDER — INSULIN GLARGINE 100 UNIT/ML ~~LOC~~ SOLN
1.00 | SUBCUTANEOUS | Status: DC
Start: ? — End: 2018-09-21

## 2018-09-21 MED ORDER — SODIUM CHLORIDE 0.9 % IV SOLN
10.00 | INTRAVENOUS | Status: DC
Start: ? — End: 2018-09-21

## 2018-10-19 ENCOUNTER — Other Ambulatory Visit: Payer: Self-pay | Admitting: Family Medicine

## 2018-11-12 ENCOUNTER — Other Ambulatory Visit: Payer: Self-pay | Admitting: Family Medicine

## 2018-11-12 DIAGNOSIS — K219 Gastro-esophageal reflux disease without esophagitis: Secondary | ICD-10-CM

## 2018-11-13 NOTE — Telephone Encounter (Signed)
Requested Prescriptions  Pending Prescriptions Disp Refills  . omeprazole (PRILOSEC) 20 MG capsule [Pharmacy Med Name: OMEPRAZOLE 20MG  CAPSULES] 90 capsule 0    Sig: TAKE 1 CAPSULE BY MOUTH ONCE A DAY     Gastroenterology: Proton Pump Inhibitors Passed - 11/12/2018 11:32 AM      Passed - Valid encounter within last 12 months    Recent Outpatient Visits          2 months ago Hyperlipidemia, unspecified hyperlipidemia type   [facility] [place], Ranell Patrick, MD   6 months ago Type 2 diabetes mellitus without complication, without long-term current use of insulin [facility])   [facility] [place], Ranell Patrick, MD   10 months ago Type 2 diabetes mellitus without complication, without long-term current use of insulin [facility])   [facility] [place], Ranell Patrick, MD   1 year ago Type 2 diabetes mellitus without complication, without long-term current use of insulin [facility])   [facility] [place], Ranell Patrick, MD   1 year ago Type 2 diabetes mellitus with hyperglycemia, without long-term current use of insulin [facility])   [facility] [place], Ranell Patrick, MD      Future Appointments            In 2 weeks Carlota Raspberry Ranell Patrick, MD [facility] [place], [facility]

## 2018-11-28 ENCOUNTER — Other Ambulatory Visit: Payer: Self-pay

## 2018-11-28 DIAGNOSIS — E119 Type 2 diabetes mellitus without complications: Secondary | ICD-10-CM

## 2018-11-28 DIAGNOSIS — E785 Hyperlipidemia, unspecified: Secondary | ICD-10-CM

## 2018-12-02 ENCOUNTER — Telehealth: Payer: Self-pay | Admitting: Family Medicine

## 2018-12-19 ENCOUNTER — Other Ambulatory Visit: Payer: Self-pay | Admitting: Family Medicine

## 2018-12-19 NOTE — Telephone Encounter (Signed)
Medication requested not on active med list as separate medications. Please review.

## 2018-12-20 ENCOUNTER — Telehealth (INDEPENDENT_AMBULATORY_CARE_PROVIDER_SITE_OTHER): Payer: Managed Care, Other (non HMO) | Admitting: Family Medicine

## 2018-12-20 ENCOUNTER — Encounter: Payer: Self-pay | Admitting: Family Medicine

## 2018-12-20 ENCOUNTER — Other Ambulatory Visit: Payer: Self-pay

## 2018-12-20 DIAGNOSIS — K219 Gastro-esophageal reflux disease without esophagitis: Secondary | ICD-10-CM

## 2018-12-20 DIAGNOSIS — I10 Essential (primary) hypertension: Secondary | ICD-10-CM

## 2018-12-20 DIAGNOSIS — E785 Hyperlipidemia, unspecified: Secondary | ICD-10-CM | POA: Diagnosis not present

## 2018-12-20 DIAGNOSIS — E119 Type 2 diabetes mellitus without complications: Secondary | ICD-10-CM | POA: Diagnosis not present

## 2018-12-20 MED ORDER — GLIMEPIRIDE 2 MG PO TABS: ORAL_TABLET | ORAL | 1 refills | Status: DC

## 2018-12-20 MED ORDER — DULAGLUTIDE 1.5 MG/0.5ML ~~LOC~~ SOAJ: 1.5000 mg | SUBCUTANEOUS | 2 refills | Status: DC

## 2018-12-20 MED ORDER — METFORMIN HCL 1000 MG PO TABS: ORAL_TABLET | ORAL | 1 refills | Status: DC

## 2018-12-20 MED ORDER — SIMVASTATIN 20 MG PO TABS: ORAL_TABLET | ORAL | 1 refills | Status: DC

## 2018-12-20 MED ORDER — OMEPRAZOLE 20 MG PO CPDR: 20.0000 mg | DELAYED_RELEASE_CAPSULE | Freq: Every day | ORAL | 1 refills | Status: DC

## 2018-12-20 MED ORDER — LOSARTAN POTASSIUM-HCTZ 50-12.5 MG PO TABS: 2.0000 | ORAL_TABLET | Freq: Every day | ORAL | 0 refills | Status: DC

## 2018-12-20 MED ORDER — GLIMEPIRIDE 4 MG PO TABS: 4.0000 mg | ORAL_TABLET | Freq: Every day | ORAL | 1 refills | Status: DC

## 2018-12-20 NOTE — Progress Notes (Signed)
Virtual Visit via Telephone Note  I connected with Wayne Perez on 12/20/18 at 3:06 PM by telephone and verified that I am speaking with the correct person using two identifiers.   I discussed the limitations, risks, security and privacy concerns of performing an evaluation and management service by telephone and the availability of in person appointments. I also discussed with the patient that there may be a patient responsible charge related to this service. The patient expressed understanding and agreed to proceed, consent obtained  Chief complaint: DM. Med refills.     History of Present Illness:  History of prostate cancer treated by [facility] with RALP 09/14/2018, PSA undetectable most recently, office visit March 10 with urology.  Only occasional urinary leakage at night, given prescription for daily sildenafil with demand dosing and Trimix.  90-monthfollow-up planned  Diabetes: Slight increase in A1c from last year to earlier this year with most recent reading 7.5.  With his upcoming planned prostate surgery at that time, did not change medications currently on Trulicity 1.5 mg q week, glimepiride 6 mg daily (4259min am, 59m63mPM), metformin 1000m75mD.  Home readings: 140-160. No symptomatic lows.  On ARB, and statin. Microalbumin: nl 12/23/17 Optho, foot exam, pneumovax: due for optho - had to cancel d/t pandemic. Plans on reschedule.   Lab Results  Component Value Date   HGBA1C 7.5 (A) 09/08/2018   HGBA1C 7.3 (H) 04/20/2018   HGBA1C 7.2 (H) 12/23/2017   Lab Results  Component Value Date   MICROALBUR 5.7 (H) 03/03/2015   LDLCALC 80 09/08/2018   CREATININE 0.99 09/08/2018   Hyperlipidemia:  Lab Results  Component Value Date   CHOL 160 09/08/2018   HDL 50 09/08/2018   LDLCALC 80 09/08/2018   TRIG 151 (H) 09/08/2018   CHOLHDL 3.2 09/08/2018   Lab Results  Component Value Date   ALT 27 09/08/2018   AST 28 09/08/2018   ALKPHOS 62 09/08/2018   BILITOT 0.3  09/08/2018   zocor 20mg46m No new myalgias/side effects.   GERD: Controlled with daily omeprazole daily - has not tolerated QOD dosing.   Hypertension: BP Readings from Last 3 Encounters:  09/08/18 124/84  04/20/18 112/78  12/23/17 122/86   Lab Results  Component Value Date   CREATININE 0.99 09/08/2018  home BP: at pharmacy - 125 s158olic.  No new side effects of meds.  Losartan 50/12.5 - 2 per day.   Constitutional: Negative for fatigue and unexpected weight change.  Eyes: Negative for visual disturbance.  Respiratory: Negative for cough, chest tightness and shortness of breath.   Cardiovascular: Negative for chest pain, palpitations and leg swelling.  Gastrointestinal: Negative for abdominal pain and blood in stool.  Neurological: Negative for dizziness, light-headedness and headaches.        Patient Active Problem List   Diagnosis Date Noted  . Erectile dysfunction due to arterial insufficiency 08/07/2015  . Varicocele 08/07/2015  . Diabetes (HCC) [place]25/2013  . HTN (hypertension) 07/31/2012   Past Medical History:  Diagnosis Date  . Diabetes mellitus without complication (HCC) [place] GERD (gastroesophageal reflux disease)   . Hyperlipidemia   . Hypertension    Past Surgical History:  Procedure Laterality Date  . COLONOSCOPY     Allergies  Allergen Reactions  . Ace Inhibitors Itching   Prior to Admission medications   Medication Sig Start Date End Date Taking? Authorizing Provider  aspirin 81 MG tablet Take 81 mg by mouth daily.   Yes [provider]  Blood Glucose Monitoring Suppl KIT One Touch Ultra or may substitute for what insurance covers.  May check once per day 09/09/14  Yes [name], MD  Dulaglutide (TRULICITY) 1.5 DZ/3.2DJ SOPN Inject 1.5 mg into the skin every 7 (seven) days. 09/08/18  Yes [name], MD  glimepiride (AMARYL) 2 MG tablet TAKE 1 TABLET(2 MG) BY MOUTH DAILY 09/08/18  Yes [name], MD  glimepiride (AMARYL) 4  MG tablet Take 1 tablet (4 mg total) by mouth daily with breakfast. 09/08/18  Yes [name], MD  glucose blood test strip accu check aviva plus - check BID (uncontrolled diabetes, on sulfonyurea with reported hypoglycemia). 04/07/17  Yes [name], MD  losartan-hydrochlorothiazide [facility]) 50-12.5 MG tablet TAKE 2 TABLETS BY MOUTH DAILY 10/19/18  Yes [name], MD  metFORMIN (GLUCOPHAGE) 1000 MG tablet TAKE 1 TABLET(1000 MG) BY MOUTH TWICE DAILY WITH A MEAL 09/08/18  Yes [name], MD  omeprazole (PRILOSEC) 20 MG capsule TAKE 1 CAPSULE BY MOUTH ONCE A DAY 11/13/18  Yes [name], MD  sildenafil (VIAGRA) 100 MG tablet  11/26/17  Yes [provider]  simvastatin (ZOCOR) 20 MG tablet TAKE 1 TABLET(20 MG) BY MOUTH EVERY EVENING 09/08/18  Yes [name], MD  tamsulosin (FLOMAX) 0.4 MG CAPS capsule Take 1 capsule (0.4 mg total) by mouth daily. 06/05/16  Yes Timmothy Euler, MD   Social History   Socioeconomic History  . Marital status: Married    Spouse name: Not on file  . Number of children: 3  . Years of education: Not on file  . Highest education level: Not on file  Occupational History  . Occupation: Airline pilot: [employer]  . Financial resource strain: Not on file  . Food insecurity:    Worry: Not on file    Inability: Not on file  . Transportation needs:    Medical: Not on file    Non-medical: Not on file  Tobacco Use  . Smoking status: Never Smoker  . Smokeless tobacco: Never Used  Substance and Sexual Activity  . Alcohol use: No  . Drug use: No  . Sexual activity: Yes  Lifestyle  . Physical activity:    Days per week: Not on file    Minutes per session: Not on file  . Stress: Not on file  Relationships  . Social connections:    Talks on phone: Not on file    Gets together: Not on file    Attends religious service: Not on file    Active member of club or organization: Not on file    Attends meetings of  clubs or organizations: Not on file    Relationship status: Not on file  . Intimate partner violence:    Fear of current or ex partner: Not on file    Emotionally abused: Not on file    Physically abused: Not on file    Forced sexual activity: Not on file  Other Topics Concern  . Not on file  Social History Narrative   Married. Education: [employer]. Exercise: Walks twice a week for 1-2 hours.     Observations/Objective: No distress on phone.   Assessment and Plan: Type 2 diabetes mellitus without complication, without long-term current use of insulin (HCC) - Plan: Dulaglutide (TRULICITY) 1.5 ME/2.6ST SOPN, glimepiride (AMARYL) 2 MG tablet, glimepiride (AMARYL) 4 MG tablet, metFORMIN (GLUCOPHAGE) 1000 MG tablet  -Lab result pending.  Continue same dose of Trulicity, glimepiride and metformin at this time.  Did discuss goal of A1c less than 7, may need to change regimen.  Gastroesophageal reflux disease, esophagitis presence not specified - Plan: omeprazole (PRILOSEC) 20 MG capsule  -Stable with daily dosing of Prilosec.  Continue same  Hyperlipidemia, unspecified hyperlipidemia type - Plan: simvastatin (ZOCOR) 20 MG tablet  -Tolerating Zocor, continue same.  Essential hypertension - Plan: losartan-hydrochlorothiazide (HYZAAR) 50-12.5 MG tablet  -Stable, labs pending, no med changes at this time.  Follow Up Instructions:  Patient Instructions  Good talking to you today.  No medication changes for now, but once I see the results of your labs we may need to adjust diabetes medications.  Follow-up with me in 3 months.  Let me know if there are questions in the meantime.  Take care.    51month.    I discussed the assessment and treatment plan with the patient. The patient was provided an opportunity to ask questions and all were answered. The patient agreed with the plan and demonstrated an understanding of the instructions.   The patient was advised to call back or seek an in-person  evaluation if the symptoms worsen or if the condition fails to improve as anticipated.  I provided 10 minutes of non-face-to-face time during this encounter.  Signed,   JMerri Ray MD [facility]  12/20/18

## 2018-12-20 NOTE — Progress Notes (Signed)
Pt c/o follow up on diabetes and needs medication refills on all medications.

## 2018-12-20 NOTE — Patient Instructions (Signed)
Good talking to you today.  No medication changes for now, but once I see the results of your labs we may need to adjust diabetes medications.  Follow-up with me in 3 months.  Let me know if there are questions in the meantime.  Take care.

## 2018-12-21 ENCOUNTER — Telehealth: Payer: Self-pay | Admitting: Family Medicine

## 2018-12-21 NOTE — Telephone Encounter (Signed)
Copied from [place] [id number]. Topic: General - Inquiry >> Dec 20, 2018  4:08 PM Wayne Perez, [place] wrote: Reason for CRM: Leanne from [place] states that the losartan-hydrochlorothiazide is currently on back order and is wondering if she can separate the two medications. Please advise and call back at [phone]

## 2018-12-21 NOTE — Telephone Encounter (Signed)
Called pharmacy ok'd for split.

## 2018-12-26 ENCOUNTER — Telehealth: Payer: Self-pay | Admitting: Family Medicine

## 2018-12-26 ENCOUNTER — Other Ambulatory Visit: Payer: Self-pay

## 2018-12-26 ENCOUNTER — Ambulatory Visit (INDEPENDENT_AMBULATORY_CARE_PROVIDER_SITE_OTHER): Payer: Managed Care, Other (non HMO) | Admitting: Family Medicine

## 2018-12-26 DIAGNOSIS — E119 Type 2 diabetes mellitus without complications: Secondary | ICD-10-CM

## 2018-12-26 DIAGNOSIS — E785 Hyperlipidemia, unspecified: Secondary | ICD-10-CM

## 2018-12-26 LAB — LIPID PANEL
Chol/HDL Ratio: 3.3 ratio (ref 0.0–5.0)
Cholesterol, Total: 163 mg/dL (ref 100–199)
HDL: 49 mg/dL (ref >39)
LDL Calculated: 84 mg/dL (ref 0–99)
Triglycerides: 150 mg/dL — ABNORMAL HIGH (ref 0–149)
VLDL Cholesterol Cal: 30 mg/dL (ref 5–40)

## 2018-12-26 LAB — COMPREHENSIVE METABOLIC PANEL
ALT: 28 IU/L (ref 0–44)
AST: 25 IU/L (ref 0–40)
Albumin/Globulin Ratio: 1.3 (ref 1.2–2.2)
Albumin: 4.2 g/dL (ref 3.8–4.9)
Alkaline Phosphatase: 71 IU/L (ref 39–117)
BUN/Creatinine Ratio: 8 — ABNORMAL LOW (ref 9–20)
BUN: 10 mg/dL (ref 6–24)
Bilirubin Total: 0.2 mg/dL (ref 0.0–1.2)
CO2: 24 mmol/L (ref 20–29)
Calcium: 9.6 mg/dL (ref 8.7–10.2)
Chloride: 99 mmol/L (ref 96–106)
Creatinine, Ser: 1.19 mg/dL (ref 0.76–1.27)
GFR calc Af Amer: 79 mL/min/{1.73_m2} (ref >59)
GFR calc non Af Amer: 68 mL/min/{1.73_m2} (ref >59)
Globulin, Total: 3.2 g/dL (ref 1.5–4.5)
Glucose: 113 mg/dL — ABNORMAL HIGH (ref 65–99)
Potassium: 4.7 mmol/L (ref 3.5–5.2)
Sodium: 137 mmol/L (ref 134–144)
Total Protein: 7.4 g/dL (ref 6.0–8.5)

## 2018-12-26 LAB — POCT GLYCOSYLATED HEMOGLOBIN (HGB A1C): Hemoglobin A1C: 7.6 % — AB (ref 4.0–5.6)

## 2018-12-26 NOTE — Telephone Encounter (Signed)
Form received from patient regarding medical exemption request for high risk conditions for COVID-19.  On review of that form it does not appear that he has listed conditions.  Did notate that he has type 2 diabetes and hypertension, but not type 1 diabetes as indicated on form.  Form is upfront for his review if needed.  Let me know if there are questions.

## 2018-12-27 NOTE — Telephone Encounter (Signed)
Left a msg on machine that medical exemption form has been completed and is at the front desk waiting for him to come by to pick up. And to give our office a call.

## 2018-12-27 NOTE — Telephone Encounter (Signed)
See other note

## 2019-03-17 ENCOUNTER — Other Ambulatory Visit: Payer: Self-pay | Admitting: Family Medicine

## 2019-03-21 ENCOUNTER — Ambulatory Visit: Payer: Managed Care, Other (non HMO) | Admitting: Family Medicine

## 2019-03-21 ENCOUNTER — Encounter: Payer: Self-pay | Admitting: Family Medicine

## 2019-03-21 ENCOUNTER — Other Ambulatory Visit: Payer: Self-pay

## 2019-03-21 VITALS — BP 140/82 | HR 67 | Temp 98.7°F | Resp 14 | Wt 212.6 lb

## 2019-03-21 DIAGNOSIS — I10 Essential (primary) hypertension: Secondary | ICD-10-CM | POA: Diagnosis not present

## 2019-03-21 DIAGNOSIS — E119 Type 2 diabetes mellitus without complications: Secondary | ICD-10-CM

## 2019-03-21 MED ORDER — TRULICITY 1.5 MG/0.5ML ~~LOC~~ SOAJ: 1.5000 mg | SUBCUTANEOUS | 2 refills | Status: DC

## 2019-03-21 MED ORDER — GLIMEPIRIDE 2 MG PO TABS: ORAL_TABLET | ORAL | 1 refills | Status: DC

## 2019-03-21 MED ORDER — GLIMEPIRIDE 4 MG PO TABS: 4.0000 mg | ORAL_TABLET | Freq: Every day | ORAL | 1 refills | Status: DC

## 2019-03-21 MED ORDER — METFORMIN HCL 1000 MG PO TABS: ORAL_TABLET | ORAL | 1 refills | Status: DC

## 2019-03-21 MED ORDER — LOSARTAN POTASSIUM-HCTZ 100-25 MG PO TABS: 1.0000 | ORAL_TABLET | Freq: Every day | ORAL | 1 refills | Status: DC

## 2019-03-21 NOTE — Patient Instructions (Addendum)
No change in medications at this time but if A1c is still elevated will need to make changes to current diabetes meds.  Blood pressure medicine was adjusted to the 100/25 mg dose, once per day.  Recheck 3 months. Let me know if there are questions sooner.    If you have lab work done today you will be contacted with your lab results within the next 2 weeks.  If you have not heard from [place] then please contact us. The fastest way to get your results is to register for My Chart.   IF you received an x-ray today, you will receive an invoice from [facility]. Please contact [facility] at [phone] with questions or concerns regarding your invoice.   IF you received labwork today, you will receive an invoice from [place] [facility]. Please contact [facility] at [phone] with questions or concerns regarding your invoice.   Our billing staff will not be able to assist you with questions regarding bills from these companies.  You will be contacted with the lab results as soon as they are available. The fastest way to get your results is to activate your My Chart account. Instructions are located on the last page of this paperwork. If you have not heard from [place] regarding the results in 2 weeks, please contact this office.

## 2019-03-21 NOTE — Progress Notes (Signed)
Subjective:    Patient ID: Wayne Perez, male    DOB: 31-Jan-1961, 58 y.o.   MRN: [id number]  HPI Wayne Perez is a 58 y.o. male Presents today for: Chief Complaint  Patient presents with   Diabetes    3 mont f/u    Diabetes:  Takes metformin 1000 mg twice daily, Amaryl 6 mg daily (26m at night and 47min am).  Trulicity 1.5 mg weekly. Is on statin and ARB. Microalbumin: Normal in April 2019 Optho, foot exam, pneumovax: trying to get optho appt - closed with pandemic. Home readings: 130-150, no symptomatic lows., high 210- few weeks ago, no other 200's.   Lab Results  Component Value Date   HGBA1C 7.6 (A) 12/26/2018   HGBA1C 7.5 (A) 09/08/2018   HGBA1C 7.3 (H) 04/20/2018   Lab Results  Component Value Date   MICROALBUR 5.7 (H) 03/03/2015   LDLCALC 84 12/26/2018   CREATININE 1.19 12/26/2018    Hypertension: BP Readings from Last 3 Encounters:  09/08/18 124/84  04/20/18 112/78  12/23/17 122/86   Lab Results  Component Value Date   CREATININE 1.19 12/26/2018  Losartan HCT 50/12.5 mg 2/day.  On aspirin 81 mg daily. Constitutional: Negative for fatigue and unexpected weight change.  Eyes: Negative for visual disturbance.  Respiratory: Negative for cough, chest tightness and shortness of breath.   Cardiovascular: Negative for chest pain, palpitations and leg swelling.  Gastrointestinal: Negative for abdominal pain and blood in stool.  Neurological: Negative for dizziness, light-headedness and headaches.   Hyperlipidemia:  Lab Results  Component Value Date   CHOL 163 12/26/2018   HDL 49 12/26/2018   LDLCALC 84 12/26/2018   TRIG 150 (H) 12/26/2018   CHOLHDL 3.3 12/26/2018   Lab Results  Component Value Date   ALT 28 12/26/2018   AST 25 12/26/2018   ALKPHOS 71 12/26/2018   BILITOT 0.2 12/26/2018  Zocor 20 mg nightly. no new myalgias or side effects.  Diabetic Foot Exam - Simple   Simple Foot Form Diabetic Foot exam was performed with the following  findings: Yes 03/21/2019  8:41 AM  Visual Inspection No deformities, no ulcerations, no other skin breakdown bilaterally: Yes Sensation Testing Intact to touch and monofilament testing bilaterally: Yes Pulse Check Posterior Tibialis and Dorsalis pulse intact bilaterally: Yes Comments      Patient Active Problem List   Diagnosis Date Noted   Erectile dysfunction due to arterial insufficiency 08/07/2015   Varicocele 08/07/2015   Diabetes ([place]/25/2013   HTN (hypertension) 07/31/2012   Past Medical History:  Diagnosis Date   Diabetes mellitus without complication (HCC)    GERD (gastroesophageal reflux disease)    Hyperlipidemia    Hypertension    Past Surgical History:  Procedure Laterality Date   COLONOSCOPY     Allergies  Allergen Reactions   Ace Inhibitors Itching   Prior to Admission medications   Medication Sig Start Date End Date Taking? Authorizing Provider  aspirin 81 MG tablet Take 81 mg by mouth daily.    [provider]  Blood Glucose Monitoring Suppl KIT One Touch Ultra or may substitute for what insurance covers.  May check once per day 09/09/14   GrWendie AgresteMD  Dulaglutide (TRULICITY) 1.5 MGKG/4.0NUOPN Inject 1.5 mg into the skin every 7 (seven) days. 12/20/18   GrWendie AgresteMD  glimepiride (AMARYL) 2 MG tablet TAKE 1 TABLET(2 MG) BY MOUTH DAILY 12/20/18   GrWendie AgresteMD  glimepiride (AMARYL) 4 MG tablet  Take 1 tablet (4 mg total) by mouth daily with breakfast. 12/20/18   [name], MD  glucose blood test strip accu check aviva plus - check BID (uncontrolled diabetes, on sulfonyurea with reported hypoglycemia). 04/07/17   [name], MD  losartan-hydrochlorothiazide (HYZAAR) 50-12.5 MG tablet Take 2 tablets by mouth daily. 12/20/18   [name], MD  metFORMIN (GLUCOPHAGE) 1000 MG tablet TAKE 1 TABLET(1000 MG) BY MOUTH TWICE DAILY WITH A MEAL 12/20/18   [name], MD  omeprazole (PRILOSEC) 20 MG  capsule Take 1 capsule (20 mg total) by mouth daily. 12/20/18   [name], MD  sildenafil (VIAGRA) 100 MG tablet  11/26/17   [provider]  simvastatin (ZOCOR) 20 MG tablet TAKE 1 TABLET(20 MG) BY MOUTH EVERY EVENING 12/20/18   [name], MD  tamsulosin (FLOMAX) 0.4 MG CAPS capsule Take 1 capsule (0.4 mg total) by mouth daily. 06/05/16   Timmothy Euler, MD   Social History   Socioeconomic History   Marital status: Married    Spouse name: Not on file   Number of children: 3   Years of education: Not on file   Highest education level: Not on file  Occupational History   Occupation: Airline pilot: [employer]  Social Needs   Financial resource strain: Not on file   Food insecurity    Worry: Not on file    Inability: Not on file   Transportation needs    Medical: Not on file    Non-medical: Not on file  Tobacco Use   Smoking status: Never Smoker   Smokeless tobacco: Never Used  Substance and Sexual Activity   Alcohol use: No   Drug use: No   Sexual activity: Yes  Lifestyle   Physical activity    Days per week: Not on file    Minutes per session: Not on file   Stress: Not on file  Relationships   Social connections    Talks on phone: Not on file    Gets together: Not on file    Attends religious service: Not on file    Active member of club or organization: Not on file    Attends meetings of clubs or organizations: Not on file    Relationship status: Not on file   Intimate partner violence    Fear of current or ex partner: Not on file    Emotionally abused: Not on file    Physically abused: Not on file    Forced sexual activity: Not on file  Other Topics Concern   Not on file  Social History Narrative   Married. Education: [employer]. Exercise: Walks twice a week for 1-2 hours.    Review of Systems  Constitutional: Negative for fatigue and unexpected weight change.  Eyes: Negative for visual disturbance.  Respiratory:  Negative for cough, chest tightness and shortness of breath.   Cardiovascular: Negative for chest pain, palpitations and leg swelling.  Gastrointestinal: Negative for abdominal pain and blood in stool.  Neurological: Negative for dizziness, light-headedness and headaches.       Objective:   Physical Exam Vitals signs reviewed.  Constitutional:      Appearance: He is well-developed.  HENT:     Head: Normocephalic and atraumatic.  Eyes:     Pupils: Pupils are equal, round, and reactive to light.  Neck:     Vascular: No carotid bruit or JVD.  Cardiovascular:     Rate and Rhythm:  Normal rate and regular rhythm.     Heart sounds: Normal heart sounds. No murmur.  Pulmonary:     Effort: Pulmonary effort is normal.     Breath sounds: Normal breath sounds. No rales.  Abdominal:     Palpations: Abdomen is soft.     Tenderness: There is no abdominal tenderness.  Skin:    General: Skin is warm and dry.     Findings: No rash.  Neurological:     Mental Status: He is alert and oriented to person, place, and time.     Comments: Microfilament testing of feet normal bilaterally.  Psychiatric:        Behavior: Behavior normal.    Vitals:   03/21/19 0829  BP: 140/82  Pulse: 67  Resp: 14  Temp: 98.7 F (37.1 C)  SpO2: 98%      Assessment & Plan:   Wayne Perez is a 58 y.o. male Type 2 diabetes mellitus without complication, without long-term current use of insulin ([place]) - Plan: Microalbumin / creatinine urine ratio, Hemoglobin A1c, losartan-hydrochlorothiazide (HYZAAR) 100-25 MG tablet, HM Diabetes Foot Exam  -Elevated readings at home.  Check A1c, likely will need med adjustments.  We will continue Trulicity, metformin, Amaryl at current doses for now.  Check urine microalbumin, encouraged to follow-up with ophthalmology for routine eye screening.  Essential hypertension - Plan: losartan-hydrochlorothiazide (HYZAAR) 100-25 MG tablet  -Stable.  Change to 100/25 mg tablet as current  dosing.    Meds ordered this encounter  Medications   losartan-hydrochlorothiazide (HYZAAR) 100-25 MG tablet    Sig: Take 1 tablet by mouth daily.    Dispense:  90 tablet    Refill:  1   Patient Instructions   No change in medications at this time but if A1c is still elevated will need to make changes to current diabetes meds.  Blood pressure medicine was adjusted to the 100/25 mg dose, once per day.  Recheck 3 months. Let me know if there are questions sooner.    If you have lab work done today you will be contacted with your lab results within the next 2 weeks.  If you have not heard from [place] then please contact us. The fastest way to get your results is to register for My Chart.   IF you received an x-ray today, you will receive an invoice from [facility]. Please contact [facility] at [phone] with questions or concerns regarding your invoice.   IF you received labwork today, you will receive an invoice from [place]. Please contact [facility] at [phone] with questions or concerns regarding your invoice.   Our billing staff will not be able to assist you with questions regarding bills from these companies.  You will be contacted with the lab results as soon as they are available. The fastest way to get your results is to activate your My Chart account. Instructions are located on the last page of this paperwork. If you have not heard from [place] regarding the results in 2 weeks, please contact this office.        Signed,   [name], MD [facility] [place].  03/21/19 8:47 AM

## 2019-03-22 LAB — HEMOGLOBIN A1C
Est. average glucose Bld gHb Est-mCnc: 183 mg/dL
Hgb A1c MFr Bld: 8 % — ABNORMAL HIGH (ref 4.8–5.6)

## 2019-03-22 LAB — MICROALBUMIN / CREATININE URINE RATIO
Creatinine, Urine: 157.6 mg/dL
Microalb/Creat Ratio: 8 mg/g creat (ref 0–29)
Microalbumin, Urine: 12.2 ug/mL

## 2019-04-16 ENCOUNTER — Other Ambulatory Visit: Payer: Self-pay | Admitting: Family Medicine

## 2019-04-16 DIAGNOSIS — E1165 Type 2 diabetes mellitus with hyperglycemia: Secondary | ICD-10-CM

## 2019-04-16 NOTE — Telephone Encounter (Signed)
Forwarding medication refill request to clinicial pool for review.

## 2019-06-09 ENCOUNTER — Other Ambulatory Visit: Payer: Self-pay | Admitting: Family Medicine

## 2019-06-09 DIAGNOSIS — E119 Type 2 diabetes mellitus without complications: Secondary | ICD-10-CM

## 2019-06-15 ENCOUNTER — Other Ambulatory Visit: Payer: Self-pay | Admitting: Family Medicine

## 2019-06-15 DIAGNOSIS — K219 Gastro-esophageal reflux disease without esophagitis: Secondary | ICD-10-CM

## 2019-06-22 ENCOUNTER — Ambulatory Visit: Payer: Managed Care, Other (non HMO) | Admitting: Family Medicine

## 2019-07-07 ENCOUNTER — Other Ambulatory Visit: Payer: Self-pay | Admitting: Family Medicine

## 2019-07-07 DIAGNOSIS — E119 Type 2 diabetes mellitus without complications: Secondary | ICD-10-CM

## 2019-07-27 ENCOUNTER — Other Ambulatory Visit: Payer: Self-pay

## 2019-07-27 ENCOUNTER — Telehealth (INDEPENDENT_AMBULATORY_CARE_PROVIDER_SITE_OTHER): Payer: Managed Care, Other (non HMO) | Admitting: Family Medicine

## 2019-07-27 VITALS — BP 141/90 | HR 83 | Temp 97.6°F | Wt 208.8 lb

## 2019-07-27 DIAGNOSIS — E1165 Type 2 diabetes mellitus with hyperglycemia: Secondary | ICD-10-CM | POA: Diagnosis not present

## 2019-07-27 DIAGNOSIS — E785 Hyperlipidemia, unspecified: Secondary | ICD-10-CM

## 2019-07-27 DIAGNOSIS — I10 Essential (primary) hypertension: Secondary | ICD-10-CM | POA: Diagnosis not present

## 2019-07-27 DIAGNOSIS — E119 Type 2 diabetes mellitus without complications: Secondary | ICD-10-CM | POA: Diagnosis not present

## 2019-07-27 MED ORDER — AMLODIPINE BESYLATE 2.5 MG PO TABS: 2.5000 mg | ORAL_TABLET | Freq: Every day | ORAL | 1 refills | Status: DC

## 2019-07-27 MED ORDER — LOSARTAN POTASSIUM-HCTZ 100-25 MG PO TABS: 1.0000 | ORAL_TABLET | Freq: Every day | ORAL | 1 refills | Status: DC

## 2019-07-27 MED ORDER — GLIMEPIRIDE 4 MG PO TABS: 8.0000 mg | ORAL_TABLET | Freq: Every day | ORAL | 1 refills | Status: DC

## 2019-07-27 NOTE — Progress Notes (Signed)
Virtual Visit via Telephone Note  I connected with Wayne Perez on 07/27/19 at 6:04 PM by telephone and verified that I am speaking with the correct person using two identifiers.   I discussed the limitations, risks, security and privacy concerns of performing an evaluation and management service by telephone and the availability of in person appointments. I also discussed with the patient that there may be a patient responsible charge related to this service. The patient expressed understanding and agreed to proceed, consent obtained  Chief complaint:  Diabetes, htn  History of Present Illness: Wayne Perez is a 58 y.o. male  Diabetes: Complicated by hyperglycemia with last A1c 8.0 in July.  Glimepiride was increased for a total of 8 mg/day but hypoglycemia precautions discussed. continued Trulicity, metformin same dose.  Has been using higher dose of glimepiride at 57m.   Home readings: 120-140 fasting and after meals. No symptomatic lows.   Microalbumin: Normal ratio March 21, 2019. Optho, foot exam, pneumovax: diabetes eye check 1 month ago - normal. up-to-date. Labs obtained this morning at lab only visit Not on statin - discussed potential statin.   Lab Results  Component Value Date   HGBA1C 8.0 (H) 03/21/2019   HGBA1C 7.6 (A) 12/26/2018   HGBA1C 7.5 (A) 09/08/2018   Lab Results  Component Value Date   MICROALBUR 5.7 (H) 03/03/2015   LDLCALC 84 12/26/2018   CREATININE 1.19 12/26/2018    Hypertension: Losartan HCT 100/25 mg daily. Home readings: no home readings - has checked at pharmacy in past.  No missed doses of meds.   BP Readings from Last 3 Encounters:  07/27/19 (!) 141/90  03/21/19 140/82  09/08/18 124/84   Lab Results  Component Value Date   CREATININE 1.19 12/26/2018    Hyperlipidemia: Not on statin Lab Results  Component Value Date   CHOL 163 12/26/2018   HDL 49 12/26/2018   LDLCALC 84 12/26/2018   TRIG 150 (H) 12/26/2018   CHOLHDL 3.3  12/26/2018   Lab Results  Component Value Date   ALT 28 12/26/2018   AST 25 12/26/2018   ALKPHOS 71 12/26/2018   BILITOT 0.2 12/26/2018       Patient Active Problem List   Diagnosis Date Noted  . Erectile dysfunction due to arterial insufficiency 08/07/2015  . Varicocele 08/07/2015  . Diabetes ([place] 07/31/2012  . HTN (hypertension) 07/31/2012   Past Medical History:  Diagnosis Date  . Diabetes mellitus without complication ([place]   . GERD (gastroesophageal reflux disease)   . Hyperlipidemia   . Hypertension    Past Surgical History:  Procedure Laterality Date  . COLONOSCOPY     Allergies  Allergen Reactions  . Ace Inhibitors Itching   Prior to Admission medications   Medication Sig Start Date End Date Taking? Authorizing Provider  ACCU-CHEK AVIVA PLUS test strip USE TO CHECK BLOOD SUGAR LEVELS 2 TIMES DAILY 04/18/19  Yes GWendie Agreste MD  aspirin 81 MG tablet Take 81 mg by mouth daily.   Yes [provider]  Blood Glucose Monitoring Suppl KIT One Touch Ultra or may substitute for what insurance covers.  May check once per day 09/09/14  Yes GWendie Agreste MD  glimepiride (AMARYL) 2 MG tablet TAKE 1 TABLET(2 MG) BY MOUTH DAILY 03/21/19  Yes GWendie Agreste MD  glimepiride (AMARYL) 4 MG tablet Take 1 tablet (4 mg total) by mouth daily with breakfast. 03/21/19  Yes GWendie Agreste MD  losartan-hydrochlorothiazide (HYZAAR) 100-25 MG tablet Take 1  tablet by mouth daily. 03/21/19  Yes [name], MD  metFORMIN (GLUCOPHAGE) 1000 MG tablet TAKE 1 TABLET(1000 MG) BY MOUTH TWICE DAILY WITH A MEAL 07/07/19  Yes [name], MD  omeprazole (PRILOSEC) 20 MG capsule TAKE 1 CAPSULE(20 MG) BY MOUTH DAILY 06/15/19  Yes [name], MD  sildenafil (VIAGRA) 100 MG tablet  11/26/17  Yes [provider]  TRULICITY 1.5 QV/9.5GL SOPN INJECT 1.5 MG SUBCUTANEOUS EVERY 7 DAYS 06/09/19  Yes [name], MD   Social History   Socioeconomic History   . Marital status: Married    Spouse name: Not on file  . Number of children: 3  . Years of education: Not on file  . Highest education level: Not on file  Occupational History  . Occupation: Airline pilot: [employer]  . Financial resource strain: Not on file  . Food insecurity    Worry: Not on file    Inability: Not on file  . Transportation needs    Medical: Not on file    Non-medical: Not on file  Tobacco Use  . Smoking status: Never Smoker  . Smokeless tobacco: Never Used  Substance and Sexual Activity  . Alcohol use: No  . Drug use: No  . Sexual activity: Yes  Lifestyle  . Physical activity    Days per week: Not on file    Minutes per session: Not on file  . Stress: Not on file  Relationships  . Social Herbalist on phone: Not on file    Gets together: Not on file    Attends religious service: Not on file    Active member of club or organization: Not on file    Attends meetings of clubs or organizations: Not on file    Relationship status: Not on file  . Intimate partner violence    Fear of current or ex partner: Not on file    Emotionally abused: Not on file    Physically abused: Not on file    Forced sexual activity: Not on file  Other Topics Concern  . Not on file  Social History Narrative   Married. Education: [employer]. Exercise: Walks twice a week for 1-2 hours.     Observations/Objective: No distress. All questions answered.   Vitals:   07/27/19 1028 07/27/19 1043  BP: (!) 144/94 (!) 141/90  Pulse: 83   Temp: 97.6 F (36.4 C)   TempSrc: Oral   SpO2: 99%   Weight: 208 lb 12.8 oz (94.7 kg)     Assessment and Plan: Type 2 diabetes mellitus with hyperglycemia, without long-term current use of insulin (HCC) - Plan: Hemoglobin A1c  - check A1c. Continue same regimen for now.   Hyperlipidemia, unspecified hyperlipidemia type - Plan: Comprehensive metabolic panel, Lipid panel  - borderline triglycerides. Discussed statin  with diabetes - will wait on labs for dose/agent.   Essential hypertension - Plan: Comprehensive metabolic panel, amLODipine (NORVASC) 2.5 MG tablet  - decreased control - add amlodipine, continue losartan hct same dose, recheck 3 months.   Follow Up Instructions: 3 months.    I discussed the assessment and treatment plan with the patient. The patient was provided an opportunity to ask questions and all were answered. The patient agreed with the plan and demonstrated an understanding of the instructions.   The patient was advised to call back or seek an in-person evaluation if the symptoms worsen or if the condition fails  to improve as anticipated.  I provided 11 minutes of non-face-to-face time during this encounter.  Signed,   [name], MD [facility] [place].  07/27/19

## 2019-07-27 NOTE — Progress Notes (Signed)
CC- diabetes f/u- Patient came into the office for labs earlier. Vital was taking at that time.

## 2019-07-28 LAB — COMPREHENSIVE METABOLIC PANEL
ALT: 26 IU/L (ref 0–44)
AST: 27 IU/L (ref 0–40)
Albumin/Globulin Ratio: 1.1 — ABNORMAL LOW (ref 1.2–2.2)
Albumin: 4 g/dL (ref 3.8–4.9)
Alkaline Phosphatase: 79 IU/L (ref 39–117)
BUN/Creatinine Ratio: 14 (ref 9–20)
BUN: 15 mg/dL (ref 6–24)
Bilirubin Total: 0.2 mg/dL (ref 0.0–1.2)
CO2: 22 mmol/L (ref 20–29)
Calcium: 9.3 mg/dL (ref 8.7–10.2)
Chloride: 103 mmol/L (ref 96–106)
Creatinine, Ser: 1.11 mg/dL (ref 0.76–1.27)
GFR calc Af Amer: 86 mL/min/{1.73_m2} (ref >59)
GFR calc non Af Amer: 74 mL/min/{1.73_m2} (ref >59)
Globulin, Total: 3.5 g/dL (ref 1.5–4.5)
Glucose: 136 mg/dL — ABNORMAL HIGH (ref 65–99)
Potassium: 4.3 mmol/L (ref 3.5–5.2)
Sodium: 138 mmol/L (ref 134–144)
Total Protein: 7.5 g/dL (ref 6.0–8.5)

## 2019-07-28 LAB — LIPID PANEL
Chol/HDL Ratio: 3.1 ratio (ref 0.0–5.0)
Cholesterol, Total: 129 mg/dL (ref 100–199)
HDL: 41 mg/dL (ref >39)
LDL Chol Calc (NIH): 61 mg/dL (ref 0–99)
Triglycerides: 160 mg/dL — ABNORMAL HIGH (ref 0–149)
VLDL Cholesterol Cal: 27 mg/dL (ref 5–40)

## 2019-07-28 LAB — HEMOGLOBIN A1C
Est. average glucose Bld gHb Est-mCnc: 189 mg/dL
Hgb A1c MFr Bld: 8.2 % — ABNORMAL HIGH (ref 4.8–5.6)

## 2019-09-11 ENCOUNTER — Other Ambulatory Visit: Payer: Self-pay | Admitting: Family Medicine

## 2019-09-11 DIAGNOSIS — E785 Hyperlipidemia, unspecified: Secondary | ICD-10-CM

## 2019-09-11 DIAGNOSIS — E119 Type 2 diabetes mellitus without complications: Secondary | ICD-10-CM

## 2019-10-17 ENCOUNTER — Other Ambulatory Visit: Payer: Self-pay | Admitting: Family Medicine

## 2019-10-17 DIAGNOSIS — E119 Type 2 diabetes mellitus without complications: Secondary | ICD-10-CM

## 2019-10-17 DIAGNOSIS — I10 Essential (primary) hypertension: Secondary | ICD-10-CM

## 2019-11-11 ENCOUNTER — Other Ambulatory Visit: Payer: Self-pay | Admitting: Family Medicine

## 2019-11-11 DIAGNOSIS — E785 Hyperlipidemia, unspecified: Secondary | ICD-10-CM

## 2019-11-11 NOTE — Telephone Encounter (Signed)
Requested medication (s) are due for refill today: yes  Requested medication (s) are on the active medication list: no  Last refill:  12/20/18  Future visit scheduled: no  Notes to clinic:  prescription expired 03/21/19 "error" noted- please review   Requested Prescriptions  Pending Prescriptions Disp Refills   simvastatin (ZOCOR) 20 MG tablet [Pharmacy Med Name: SIMVASTATIN 20MG  TABLETS] 90 tablet 1    Sig: TAKE 1 TABLET(20 MG) BY MOUTH EVERY EVENING      Cardiovascular:  Antilipid - Statins Failed - 11/11/2019  3:32 PM      Failed - Triglycerides in normal range and within 360 days    Triglycerides  Date Value Ref Range Status  07/27/2019 160 (H) 0 - 149 mg/dL Final          Passed - Total Cholesterol in normal range and within 360 days    Cholesterol, Total  Date Value Ref Range Status  07/27/2019 129 100 - 199 mg/dL Final          Passed - LDL in normal range and within 360 days    LDL Chol Calc (NIH)  Date Value Ref Range Status  07/27/2019 61 0 - 99 mg/dL Final          Passed - HDL in normal range and within 360 days    HDL  Date Value Ref Range Status  07/27/2019 41 >39 mg/dL Final          Passed - Patient is not pregnant      Passed - Valid encounter within last 12 months    Recent Outpatient Visits           3 months ago Type 2 diabetes mellitus with hyperglycemia, without long-term current use of insulin ([place])   [facility], Ranell Patrick, MD   7 months ago Type 2 diabetes mellitus without complication, without long-term current use of insulin [facility])   [facility], Ranell Patrick, MD   10 months ago Type 2 diabetes mellitus without complication, without long-term current use of insulin [facility])   [facility], Ranell Patrick, MD   10 months ago Type 2 diabetes mellitus without complication, without long-term current use of insulin [facility])   [facility], Ranell Patrick, MD   1 year ago Hyperlipidemia,  unspecified hyperlipidemia type   [facility], Ranell Patrick, MD

## 2019-12-08 ENCOUNTER — Other Ambulatory Visit: Payer: Self-pay | Admitting: Family Medicine

## 2019-12-08 DIAGNOSIS — K219 Gastro-esophageal reflux disease without esophagitis: Secondary | ICD-10-CM

## 2019-12-14 ENCOUNTER — Ambulatory Visit: Payer: Managed Care, Other (non HMO) | Admitting: Family Medicine

## 2019-12-14 ENCOUNTER — Other Ambulatory Visit: Payer: Self-pay

## 2019-12-14 ENCOUNTER — Encounter: Payer: Self-pay | Admitting: Family Medicine

## 2019-12-14 VITALS — BP 150/91 | HR 82 | Temp 98.0°F | Ht 67.0 in | Wt 203.0 lb

## 2019-12-14 DIAGNOSIS — E785 Hyperlipidemia, unspecified: Secondary | ICD-10-CM

## 2019-12-14 DIAGNOSIS — I10 Essential (primary) hypertension: Secondary | ICD-10-CM

## 2019-12-14 DIAGNOSIS — E1165 Type 2 diabetes mellitus with hyperglycemia: Secondary | ICD-10-CM

## 2019-12-14 LAB — HEMOGLOBIN A1C
Est. average glucose Bld gHb Est-mCnc: 217 mg/dL
Hgb A1c MFr Bld: 9.2 % — ABNORMAL HIGH (ref 4.8–5.6)

## 2019-12-14 MED ORDER — SIMVASTATIN 20 MG PO TABS: 20.0000 mg | ORAL_TABLET | Freq: Every day | ORAL | 1 refills | Status: DC

## 2019-12-14 MED ORDER — LOSARTAN POTASSIUM-HCTZ 100-25 MG PO TABS: 1.0000 | ORAL_TABLET | Freq: Every day | ORAL | 1 refills | Status: DC

## 2019-12-14 MED ORDER — METFORMIN HCL 1000 MG PO TABS: 1000.0000 mg | ORAL_TABLET | Freq: Two times a day (BID) | ORAL | 1 refills | Status: DC

## 2019-12-14 MED ORDER — AMLODIPINE BESYLATE 5 MG PO TABS: 5.0000 mg | ORAL_TABLET | Freq: Every day | ORAL | 1 refills | Status: DC

## 2019-12-14 MED ORDER — TRULICITY 3 MG/0.5ML ~~LOC~~ SOAJ: 3.0000 mg | SUBCUTANEOUS | 1 refills | Status: DC

## 2019-12-14 MED ORDER — GLIMEPIRIDE 4 MG PO TABS: 8.0000 mg | ORAL_TABLET | Freq: Every day | ORAL | 1 refills | Status: DC

## 2019-12-14 NOTE — Progress Notes (Signed)
Subjective:  Patient ID: Wayne Perez, male    DOB: Sep 13, 1960  Age: 59 y.o. MRN: [id number]  CC:  Chief Complaint  Patient presents with  . Diabetes    3 month Follow-up. pt states he hasn't had any issues with this condition at home physicaly, but pt states his BS readings are not going down. pt checks his BS 2x daily morning reading has been 150-160. at night after exercising pt reports 180s. pt states he check last night with out exercising and it was in the 200's. pt reports takeing medication as prescribed.    HPI [name] presents for   Diabetes: Complicated by hyperglycemia. Reports home readings still elevated between 150-160 in the morning, 140-150 after exercise.  209 if not exercising. No symptomatic lows.  Has been taking glimepiride 8 mg daily, Trulicity 1.5 mg weekly, Metformin 1000 mg twice daily.  He is on ARB, statin - simvastatin ran out 3 days ago.  Microalbumin: Normal ratio March 21, 2019 Optho, foot exam, pneumovax: Ophthalmology exam due, otherwise up-to-date  Lab Results  Component Value Date   HGBA1C 8.2 (H) 07/27/2019   HGBA1C 8.0 (H) 03/21/2019   HGBA1C 7.6 (A) 12/26/2018   Lab Results  Component Value Date   MICROALBUR 5.7 (H) 03/03/2015   LDLCALC 61 07/27/2019   CREATININE 1.11 07/27/2019   Hypertension: Decreased control in November, amlodipine 2.5 mg added, was continued on losartan HCTZ 100/25 mg daily.. Home readings: none.  Exercise: daily - 6 d per week, 1 hour.  BP Readings from Last 3 Encounters:  12/14/19 (!) 150/91  07/27/19 (!) 141/90  03/21/19 140/82   Lab Results  Component Value Date   CREATININE 1.11 07/27/2019   Lipid screening: Simvastatin 58m, no myalgias or new side effects. Ran out 3 days ago.  Lab Results  Component Value Date   CHOL 129 07/27/2019   HDL 41 07/27/2019   LDLCALC 61 07/27/2019   TRIG 160 (H) 07/27/2019   CHOLHDL 3.1 07/27/2019   Lab Results  Component Value Date   ALT 26 07/27/2019   AST  27 07/27/2019   ALKPHOS 79 07/27/2019   BILITOT 0.2 07/27/2019       History Patient Active Problem List   Diagnosis Date Noted  . Erectile dysfunction due to arterial insufficiency 08/07/2015  . Varicocele 08/07/2015  . Diabetes [place] 07/31/2012  . HTN (hypertension) 07/31/2012   Past Medical History:  Diagnosis Date  . Diabetes mellitus without complication ([place]   . GERD (gastroesophageal reflux disease)   . Hyperlipidemia   . Hypertension    Past Surgical History:  Procedure Laterality Date  . COLONOSCOPY     Allergies  Allergen Reactions  . Ace Inhibitors Itching   Prior to Admission medications   Medication Sig Start Date End Date Taking? Authorizing Provider  ACCU-CHEK AVIVA PLUS test strip USE TO CHECK BLOOD SUGAR LEVELS 2 TIMES DAILY 04/18/19  Yes GWendie Agreste MD  amLODipine (NORVASC) 2.5 MG tablet Take 1 tablet (2.5 mg total) by mouth daily. 07/27/19  Yes GWendie Agreste MD  aspirin 81 MG tablet Take 81 mg by mouth daily.   Yes [provider]  Blood Glucose Monitoring Suppl KIT One Touch Ultra or may substitute for what insurance covers.  May check once per day 09/09/14  Yes GWendie Agreste MD  glimepiride (AMARYL) 4 MG tablet Take 2 tablets (8 mg total) by mouth daily with breakfast. 07/27/19  Yes GWendie Agreste MD  losartan-hydrochlorothiazide ([facility]  100-25 MG tablet TAKE 1 TABLET BY MOUTH DAILY 10/17/19  Yes [name], MD  metFORMIN (GLUCOPHAGE) 1000 MG tablet TAKE 1 TABLET(1000 MG) BY MOUTH TWICE DAILY WITH A MEAL 09/11/19  Yes [name], MD  omeprazole (PRILOSEC) 20 MG capsule TAKE 1 CAPSULE(20 MG) BY MOUTH DAILY 12/08/19  Yes [name], MD  TRULICITY 1.5 PP/2.9JJ SOPN INJECT 1.5 MG SUBCUTANEOUS EVERY 7 DAYS 06/09/19  Yes [name], MD  sildenafil (VIAGRA) 100 MG tablet  11/26/17   [provider]   Social History   Socioeconomic History  . Marital status: Married    Spouse name: Not on file  .  Number of children: 3  . Years of education: Not on file  . Highest education level: Not on file  Occupational History  . Occupation: Airline pilot: [employer]  Tobacco Use  . Smoking status: Never Smoker  . Smokeless tobacco: Never Used  Substance and Sexual Activity  . Alcohol use: No  . Drug use: No  . Sexual activity: Yes  Other Topics Concern  . Not on file  Social History Narrative   Married. Education: [employer]. Exercise: Walks twice a week for 1-2 hours.   Social Determinants of Health   Financial Resource Strain:   . Difficulty of Paying Living Expenses:   Food Insecurity:   . Worried About Charity fundraiser in the Last Year:   . Arboriculturist in the Last Year:   Transportation Needs:   . Film/video editor (Medical):   Marland Kitchen Lack of Transportation (Non-Medical):   Physical Activity:   . Days of Exercise per Week:   . Minutes of Exercise per Session:   Stress:   . Feeling of Stress :   Social Connections:   . Frequency of Communication with Friends and Family:   . Frequency of Social Gatherings with Friends and Family:   . Attends Religious Services:   . Active Member of Clubs or Organizations:   . Attends Archivist Meetings:   Marland Kitchen Marital Status:   Intimate Partner Violence:   . Fear of Current or Ex-Partner:   . Emotionally Abused:   Marland Kitchen Physically Abused:   . Sexually Abused:     Review of Systems Per HPI  Objective:   Vitals:   12/14/19 0909  BP: (!) 150/91  Pulse: 82  Temp: 98 F (36.7 C)  TempSrc: Temporal  SpO2: 97%  Weight: 203 lb (92.1 kg)  Height: 5' 7"  (1.702 m)     Physical Exam Vitals reviewed.  Constitutional:      Appearance: He is well-developed.  HENT:     Head: Normocephalic and atraumatic.  Eyes:     Pupils: Pupils are equal, round, and reactive to light.  Neck:     Vascular: No carotid bruit or JVD.  Cardiovascular:     Rate and Rhythm: Normal rate and regular rhythm.     Heart sounds: Normal heart  sounds. No murmur.  Pulmonary:     Effort: Pulmonary effort is normal.     Breath sounds: Normal breath sounds. No rales.  Skin:    General: Skin is warm and dry.  Neurological:     Mental Status: He is alert and oriented to person, place, and time.        Assessment & Plan:  Tanvir Hipple is a 59 y.o. male . Type 2 diabetes mellitus with hyperglycemia, without long-term current use of insulin (HCC) -  Plan: Dulaglutide (TRULICITY) 3 QV/5.0IT SOPN, amLODipine (NORVASC) 5 MG tablet, simvastatin (ZOCOR) 20 MG tablet, metFORMIN (GLUCOPHAGE) 1000 MG tablet, losartan-hydrochlorothiazide (HYZAAR) 100-25 MG tablet, glimepiride (AMARYL) 4 MG tablet, Hemoglobin A1c  -Decreased control will increase the Trulicity to 3 mg weekly, continue other regimen for now.  Recheck 3 months  Essential hypertension - Plan: losartan-hydrochlorothiazide (HYZAAR) 100-25 MG tablet  -Minimal change with addition of amlodipine, will add additional 2.5 for total 5 mg dose daily, continue losartan HCT same dose.  Hyperlipidemia, unspecified hyperlipidemia type  -Simvastatin reordered.  Recheck 3 months.  No orders of the defined types were placed in this encounter.  Patient Instructions       If you have lab work done today you will be contacted with your lab results within the next 2 weeks.  If you have not heard from [place] then please contact us. The fastest way to get your results is to register for My Chart.   IF you received an x-ray today, you will receive an invoice from [facility]. Please contact [facility] at [phone] with questions or concerns regarding your invoice.   IF you received labwork today, you will receive an invoice from [place]. Please contact [facility] at [phone] with questions or concerns regarding your invoice.   Our billing staff will not be able to assist you with questions regarding bills from these companies.  You will be contacted with the lab results  as soon as they are available. The fastest way to get your results is to activate your My Chart account. Instructions are located on the last page of this paperwork. If you have not heard from [place] regarding the results in 2 weeks, please contact this office.         Signed, [name], MD [facility]

## 2019-12-14 NOTE — Patient Instructions (Addendum)
  Continue the good work with exercise, and watching diet. Increased Trulicity to 3 mg injection once per week, and amlodipine increased to 5 mg for improved blood pressure control. Recheck in 3 months but let me know if there are questions sooner.   If you have lab work done today you will be contacted with your lab results within the next 2 weeks.  If you have not heard from [place] then please contact us. The fastest way to get your results is to register for My Chart.   IF you received an x-ray today, you will receive an invoice from [facility]. Please contact [facility] Radiology at [phone] with questions or concerns regarding your invoice.   IF you received labwork today, you will receive an invoice from [place]. Please contact [facility] at [phone] with questions or concerns regarding your invoice.   Our billing staff will not be able to assist you with questions regarding bills from these companies.  You will be contacted with the lab results as soon as they are available. The fastest way to get your results is to activate your My Chart account. Instructions are located on the last page of this paperwork. If you have not heard from [place] regarding the results in 2 weeks, please contact this office.

## 2019-12-23 ENCOUNTER — Other Ambulatory Visit: Payer: Self-pay | Admitting: Family Medicine

## 2019-12-23 DIAGNOSIS — E119 Type 2 diabetes mellitus without complications: Secondary | ICD-10-CM

## 2020-01-04 ENCOUNTER — Other Ambulatory Visit: Payer: Self-pay | Admitting: Family Medicine

## 2020-01-04 DIAGNOSIS — E1165 Type 2 diabetes mellitus with hyperglycemia: Secondary | ICD-10-CM

## 2020-01-21 ENCOUNTER — Other Ambulatory Visit: Payer: Self-pay | Admitting: Family Medicine

## 2020-01-21 DIAGNOSIS — I10 Essential (primary) hypertension: Secondary | ICD-10-CM

## 2020-03-27 ENCOUNTER — Encounter: Payer: Managed Care, Other (non HMO) | Admitting: Family Medicine

## 2020-04-11 ENCOUNTER — Ambulatory Visit: Payer: Managed Care, Other (non HMO) | Admitting: Family Medicine

## 2020-04-11 ENCOUNTER — Encounter: Payer: Self-pay | Admitting: Family Medicine

## 2020-04-11 ENCOUNTER — Other Ambulatory Visit: Payer: Self-pay

## 2020-04-11 VITALS — BP 132/86 | HR 85 | Temp 98.2°F | Ht 67.0 in | Wt 201.0 lb

## 2020-04-11 DIAGNOSIS — E1165 Type 2 diabetes mellitus with hyperglycemia: Secondary | ICD-10-CM

## 2020-04-11 DIAGNOSIS — E785 Hyperlipidemia, unspecified: Secondary | ICD-10-CM | POA: Diagnosis not present

## 2020-04-11 DIAGNOSIS — I10 Essential (primary) hypertension: Secondary | ICD-10-CM | POA: Diagnosis not present

## 2020-04-11 LAB — COMPREHENSIVE METABOLIC PANEL
ALT: 26 IU/L (ref 0–44)
AST: 20 IU/L (ref 0–40)
Albumin/Globulin Ratio: 1.1 — ABNORMAL LOW (ref 1.2–2.2)
Albumin: 4 g/dL (ref 3.8–4.9)
Alkaline Phosphatase: 85 IU/L (ref 48–121)
BUN/Creatinine Ratio: 11 (ref 9–20)
BUN: 11 mg/dL (ref 6–24)
Bilirubin Total: 0.3 mg/dL (ref 0.0–1.2)
CO2: 21 mmol/L (ref 20–29)
Calcium: 9.6 mg/dL (ref 8.7–10.2)
Chloride: 103 mmol/L (ref 96–106)
Creatinine, Ser: 0.99 mg/dL (ref 0.76–1.27)
GFR calc Af Amer: 98 mL/min/{1.73_m2} (ref >59)
GFR calc non Af Amer: 85 mL/min/{1.73_m2} (ref >59)
Globulin, Total: 3.6 g/dL (ref 1.5–4.5)
Glucose: 111 mg/dL — ABNORMAL HIGH (ref 65–99)
Potassium: 4.2 mmol/L (ref 3.5–5.2)
Sodium: 137 mmol/L (ref 134–144)
Total Protein: 7.6 g/dL (ref 6.0–8.5)

## 2020-04-11 LAB — LIPID PANEL
Chol/HDL Ratio: 3.1 ratio (ref 0.0–5.0)
Cholesterol, Total: 134 mg/dL (ref 100–199)
HDL: 43 mg/dL (ref >39)
LDL Chol Calc (NIH): 71 mg/dL (ref 0–99)
Triglycerides: 108 mg/dL (ref 0–149)
VLDL Cholesterol Cal: 20 mg/dL (ref 5–40)

## 2020-04-11 LAB — HEMOGLOBIN A1C
Est. average glucose Bld gHb Est-mCnc: 174 mg/dL
Hgb A1c MFr Bld: 7.7 % — ABNORMAL HIGH (ref 4.8–5.6)

## 2020-04-11 NOTE — Patient Instructions (Addendum)
°  Blood sugars are improving.  I will check your A1c to determine if other changes needed.  If we do end up increasing Trulicity, will need to watch for any symptomatic lows especially with your combination of medicines.  Keep up the good work with exercise and I will follow up with you next month.  Take care.    If you have lab work done today you will be contacted with your lab results within the next 2 weeks.  If you have not heard from [place] then please contact us. The fastest way to get your results is to register for My Chart.   IF you received an x-ray today, you will receive an invoice from [facility]. Please contact [facility] at [phone] with questions or concerns regarding your invoice.   IF you received labwork today, you will receive an invoice from [place]. Please contact [facility] at [phone] with questions or concerns regarding your invoice.   Our billing staff will not be able to assist you with questions regarding bills from these companies.  You will be contacted with the lab results as soon as they are available. The fastest way to get your results is to activate your My Chart account. Instructions are located on the last page of this paperwork. If you have not heard from [place] regarding the results in 2 weeks, please contact this office.

## 2020-04-11 NOTE — Progress Notes (Signed)
Subjective:  Patient ID: Wayne Perez, male    DOB: 07-01-1961  Age: 59 y.o. MRN: [id number]  CC:  Chief Complaint  Patient presents with  . Follow-up    on hypertension and diabetes. Pt reports no issues with these conditions and no physical symptoms. Pt dosn't check his BP , but he dose check his BS every now and then. pt reports thismornings reading was 146m/dl fasting. Pt reports the readings are spiratic but pt checks his BS at diffrent times of the day. sometimes in the morning sometimes at night and other times after the pt gets done exercising.    HPI JAtha Mcbainpresents for   Hypertension: Amlodipine dose increased last visit to 5 mg daily, continued losartan HCTZ 100/25 mg daily. Home readings: none.  No new side effects, no new edema.  Has appt in 1 month for physical   BP Readings from Last 3 Encounters:  04/11/20 132/86  12/14/19 (!) 150/91  07/27/19 (!) 141/90   Lab Results  Component Value Date   CREATININE 1.11 07/27/2019    Ongoing follow up with urology with hx of prostatectomy - Dr. CSherryll Burger   Diabetes: Complicated by hyperglycemia with increased A1c at last visit in April. Trulicity increased to 3 mg weekly continue Metformin 1000 mg twice daily, and glimepiride at 8 mg daily.  He is on statin and ARB.  Zocor 20 mg daily, with last LDL under 70. Fasting reading 122 this morning. Postprandial 150-160, better with exercising. No further 200's.  Exercising most days per week. Walking on treadmill- 30-35 mins.  No symptomatic lows. Microalbumin: Normal ratio July, 2020 Optho, foot exam, pneumovax: ophthalmology exam in March.   COVID-19 vaccine: 2nd shot on 7/20 - Pfizer, no significant side effects.   Lab Results  Component Value Date   HGBA1C 9.2 (H) 12/14/2019   HGBA1C 8.2 (H) 07/27/2019   HGBA1C 8.0 (H) 03/21/2019   Lab Results  Component Value Date   MICROALBUR 5.7 (H) 03/03/2015   LDLCALC 61 07/27/2019   CREATININE 1.11 07/27/2019   '  Hyperlipidemia: Tolerating statin, no new side effects. Lab Results  Component Value Date   CHOL 129 07/27/2019   HDL 41 07/27/2019   LDLCALC 61 07/27/2019   TRIG 160 (H) 07/27/2019   CHOLHDL 3.1 07/27/2019   Lab Results  Component Value Date   ALT 26 07/27/2019   AST 27 07/27/2019   ALKPHOS 79 07/27/2019   BILITOT 0.2 07/27/2019     History Patient Active Problem List   Diagnosis Date Noted  . Erectile dysfunction due to arterial insufficiency 08/07/2015  . Varicocele 08/07/2015  . Diabetes ([place] 07/31/2012  . HTN (hypertension) 07/31/2012   Past Medical History:  Diagnosis Date  . Diabetes mellitus without complication ([place]   . GERD (gastroesophageal reflux disease)   . Hyperlipidemia   . Hypertension    Past Surgical History:  Procedure Laterality Date  . COLONOSCOPY     Allergies  Allergen Reactions  . Ace Inhibitors Itching   Prior to Admission medications   Medication Sig Start Date End Date Taking? Authorizing Provider  ACCU-CHEK AVIVA PLUS test strip USE TO CHECK BLOOD SUGAR LEVELS 2 TIMES DAILY 04/18/19  Yes GWendie Agreste MD  amLODipine (NORVASC) 5 MG tablet Take 1 tablet (5 mg total) by mouth daily. 12/14/19  Yes GWendie Agreste MD  Blood Glucose Monitoring Suppl KIT One Touch Ultra or may substitute for what insurance covers.  May check once per day 09/09/14  Yes [name], MD  Dulaglutide (TRULICITY) 3 BD/5.3GD SOPN Inject 3 mg as directed once a week. 12/14/19  Yes [name], MD  glimepiride (AMARYL) 4 MG tablet Take 2 tablets (8 mg total) by mouth daily with breakfast. 12/14/19  Yes [name], MD  losartan-hydrochlorothiazide (HYZAAR) 100-25 MG tablet Take 1 tablet by mouth daily. 12/14/19  Yes [name], MD  metFORMIN (GLUCOPHAGE) 1000 MG tablet Take 1 tablet (1,000 mg total) by mouth 2 (two) times daily with a meal. 12/14/19  Yes [name], MD  omeprazole (PRILOSEC) 20 MG capsule TAKE 1 CAPSULE(20 MG) BY  MOUTH DAILY 12/08/19  Yes [name], MD  simvastatin (ZOCOR) 20 MG tablet Take 1 tablet (20 mg total) by mouth at bedtime. 12/14/19  Yes [name], MD  aspirin 81 MG tablet Take 81 mg by mouth daily. Patient not taking: Reported on 04/11/2020    [provider]  sildenafil (VIAGRA) 100 MG tablet  11/26/17   [provider]   Social History   Socioeconomic History  . Marital status: Married    Spouse name: Not on file  . Number of children: 3  . Years of education: Not on file  . Highest education level: Not on file  Occupational History  . Occupation: Airline pilot: [employer]  Tobacco Use  . Smoking status: Never Smoker  . Smokeless tobacco: Never Used  Substance and Sexual Activity  . Alcohol use: No  . Drug use: No  . Sexual activity: Yes  Other Topics Concern  . Not on file  Social History Narrative   Married. Education: [employer]. Exercise: Walks twice a week for 1-2 hours.   Social Determinants of Health   Financial Resource Strain:   . Difficulty of Paying Living Expenses:   Food Insecurity:   . Worried About Charity fundraiser in the Last Year:   . Arboriculturist in the Last Year:   Transportation Needs:   . Film/video editor (Medical):   Marland Kitchen Lack of Transportation (Non-Medical):   Physical Activity:   . Days of Exercise per Week:   . Minutes of Exercise per Session:   Stress:   . Feeling of Stress :   Social Connections:   . Frequency of Communication with Friends and Family:   . Frequency of Social Gatherings with Friends and Family:   . Attends Religious Services:   . Active Member of Clubs or Organizations:   . Attends Archivist Meetings:   Marland Kitchen Marital Status:   Intimate Partner Violence:   . Fear of Current or Ex-Partner:   . Emotionally Abused:   Marland Kitchen Physically Abused:   . Sexually Abused:     Review of Systems  Constitutional: Negative for fatigue and unexpected weight change.  Eyes: Negative for visual  disturbance.  Respiratory: Negative for cough, chest tightness and shortness of breath.   Cardiovascular: Negative for chest pain, palpitations and leg swelling.  Gastrointestinal: Negative for abdominal pain and blood in stool.  Neurological: Negative for dizziness, light-headedness and headaches.     Objective:   Vitals:   04/11/20 1029 04/11/20 1035  BP: (!) 139/93 132/86  Pulse: 85   Temp: 98.2 F (36.8 C)   TempSrc: Temporal   SpO2: 97%   Weight: 201 lb (91.2 kg)   Height: 5' 7"  (1.702 m)      Physical Exam Vitals reviewed.  Constitutional:  Appearance: He is well-developed.  HENT:     Head: Normocephalic and atraumatic.  Eyes:     Pupils: Pupils are equal, round, and reactive to light.  Neck:     Vascular: No carotid bruit or JVD.  Cardiovascular:     Rate and Rhythm: Normal rate and regular rhythm.     Heart sounds: Normal heart sounds. No murmur heard.   Pulmonary:     Effort: Pulmonary effort is normal.     Breath sounds: Normal breath sounds. No rales.  Skin:    General: Skin is warm and dry.  Neurological:     Mental Status: He is alert and oriented to person, place, and time.        Assessment & Plan:  Kingstin Heims is a 59 y.o. male . Essential hypertension - Plan: Lipid panel, Comprehensive metabolic panel  -Improved control on amlodipine.  Continue current dose with same dose Hyzaar.  Labs pending  Type 2 diabetes mellitus with hyperglycemia, without long-term current use of insulin ([place]) - Plan: Hemoglobin A1c  -Improved home readings, check A1c.  Option of increasing Trulicity further to 4.5 mg weekly.  Would need to be cautious with watching for symptomatic hypoglycemia with use of glimepiride.  Continue Metformin, glimepiride same doses for now.  Hyperlipidemia, unspecified hyperlipidemia type - Plan: Lipid panel, Comprehensive metabolic panel  -Tolerating statin, continue same dose for now, previously at goal with LDL less than  70.  No orders of the defined types were placed in this encounter.  Patient Instructions    Blood sugars are improving.  I will check your A1c to determine if other changes needed.  If we do end up increasing Trulicity, will need to watch for any symptomatic lows especially with your combination of medicines.  Keep up the good work with exercise and I will follow up with you next month.  Take care.    If you have lab work done today you will be contacted with your lab results within the next 2 weeks.  If you have not heard from [place] then please contact us. The fastest way to get your results is to register for My Chart.   IF you received an x-ray today, you will receive an invoice from [facility]. Please contact [facility] Radiology at [phone] with questions or concerns regarding your invoice.   IF you received labwork today, you will receive an invoice from [place]. Please contact [facility] at [phone] with questions or concerns regarding your invoice.   Our billing staff will not be able to assist you with questions regarding bills from these companies.  You will be contacted with the lab results as soon as they are available. The fastest way to get your results is to activate your My Chart account. Instructions are located on the last page of this paperwork. If you have not heard from [place] regarding the results in 2 weeks, please contact this office.         Signed, [name], MD [facility]

## 2020-05-30 ENCOUNTER — Other Ambulatory Visit: Payer: Self-pay

## 2020-05-30 ENCOUNTER — Encounter: Payer: Self-pay | Admitting: Family Medicine

## 2020-05-30 ENCOUNTER — Ambulatory Visit (INDEPENDENT_AMBULATORY_CARE_PROVIDER_SITE_OTHER): Payer: Managed Care, Other (non HMO) | Admitting: Family Medicine

## 2020-05-30 VITALS — BP 130/86 | HR 89 | Temp 97.6°F | Ht 67.0 in | Wt 199.0 lb

## 2020-05-30 DIAGNOSIS — I10 Essential (primary) hypertension: Secondary | ICD-10-CM

## 2020-05-30 DIAGNOSIS — K219 Gastro-esophageal reflux disease without esophagitis: Secondary | ICD-10-CM | POA: Diagnosis not present

## 2020-05-30 DIAGNOSIS — Z23 Encounter for immunization: Secondary | ICD-10-CM

## 2020-05-30 DIAGNOSIS — Z0001 Encounter for general adult medical examination with abnormal findings: Secondary | ICD-10-CM

## 2020-05-30 DIAGNOSIS — E1165 Type 2 diabetes mellitus with hyperglycemia: Secondary | ICD-10-CM

## 2020-05-30 DIAGNOSIS — Z0000 Encounter for general adult medical examination without abnormal findings: Secondary | ICD-10-CM

## 2020-05-30 MED ORDER — SIMVASTATIN 20 MG PO TABS: 20.0000 mg | ORAL_TABLET | Freq: Every day | ORAL | 1 refills | Status: DC

## 2020-05-30 MED ORDER — TRULICITY 3 MG/0.5ML ~~LOC~~ SOAJ: 3.0000 mg | SUBCUTANEOUS | 1 refills | Status: DC

## 2020-05-30 MED ORDER — ZOSTER VAC RECOMB ADJUVANTED 50 MCG/0.5ML IM SUSR: 0.5000 mL | Freq: Once | INTRAMUSCULAR | 0 refills | Status: DC

## 2020-05-30 MED ORDER — GLIMEPIRIDE 4 MG PO TABS: 8.0000 mg | ORAL_TABLET | Freq: Every day | ORAL | 1 refills | Status: DC

## 2020-05-30 MED ORDER — AMLODIPINE BESYLATE 5 MG PO TABS: 5.0000 mg | ORAL_TABLET | Freq: Every day | ORAL | 1 refills | Status: DC

## 2020-05-30 MED ORDER — OMEPRAZOLE 20 MG PO CPDR: DELAYED_RELEASE_CAPSULE | ORAL | 2 refills | Status: DC

## 2020-05-30 MED ORDER — METFORMIN HCL 1000 MG PO TABS: 1000.0000 mg | ORAL_TABLET | Freq: Two times a day (BID) | ORAL | 1 refills | Status: DC

## 2020-05-30 MED ORDER — LOSARTAN POTASSIUM-HCTZ 100-25 MG PO TABS: 1.0000 | ORAL_TABLET | Freq: Every day | ORAL | 1 refills | Status: DC

## 2020-05-30 NOTE — Progress Notes (Signed)
 Subjective:  Patient ID: Wayne Perez, male    DOB: 09/06/1960  Age: 59 y.o. MRN: [id number]  CC:  Chief Complaint  Patient presents with  . Annual Exam    Pt reports as far as his general health he feels fine with no complaints.    HPI Wayne Perez presents for   Annual physical exam.  Recent office visit in August to review his chronic medical conditions.  Trulicity was recommended to increase to 4.5 mg weekly at that time for A1c of 7.7. had improved, he decided to remain on same regimen.  Home readings: 130-150, No symptomatic lows.  Needs med refills.   Cancer screening Colonoscopy September 2015.  Polyp removed.  Repeat colonoscopy 10 years. Prostate cancer - treated by Dr. Craven, [facility]. appt 6/1 S/p RALP. psa <0.014 on 01/29/20.  quadmix for ED, failed trimix.  Min relief.   Immunization History  Administered Date(s) Administered  . Hepatitis B 03/05/2013  . Hepatitis B, adult 04/04/2013, 09/03/2013  . Influenza Split 05/08/2011, 07/31/2012, 06/20/2013  . Influenza,inj,Quad PF,6+ Mos 05/12/2015  . Influenza-Unspecified 06/03/2014, 06/05/2016  . PFIZER SARS-COV-2 Vaccination 03/12/2020, 04/04/2020  . Pneumococcal Polysaccharide-23 02/02/2017  . Td 05/16/2009  . Tdap 05/12/2015  shingles -  Agrees to today.  Flu vaccine - will have done at work.   Depression screen PHQ 2/9 05/30/2020 04/11/2020 12/14/2019 07/27/2019 03/21/2019  Decreased Interest 0 0 0 0 0  Down, Depressed, Hopeless 0 0 0 0 0  PHQ - 2 Score 0 0 0 0 0    Hearing Screening   125Hz 250Hz 500Hz 1000Hz 2000Hz 3000Hz 4000Hz 6000Hz 8000Hz  Right ear:           Left ear:             Visual Acuity Screening   Right eye Left eye Both eyes  Without correction:     With correction: 20/15 20/15 20/13  optho appt soon, last visit in 05/2019.   Dental: every 6months - appt today  Exercise: daily. 150mins per week at least. 4-5 days per week.   Wt Readings from Last 3 Encounters:  05/30/20  199 lb (90.3 kg)  04/11/20 201 lb (91.2 kg)  12/14/19 203 lb (92.1 kg)    History Patient Active Problem List   Diagnosis Date Noted  . Erectile dysfunction due to arterial insufficiency 08/07/2015  . Varicocele 08/07/2015  . Diabetes (HCC) 07/31/2012  . HTN (hypertension) 07/31/2012   Past Medical History:  Diagnosis Date  . Diabetes mellitus without complication (HCC)   . GERD (gastroesophageal reflux disease)   . Hyperlipidemia   . Hypertension    Past Surgical History:  Procedure Laterality Date  . COLONOSCOPY     Allergies  Allergen Reactions  . Ace Inhibitors Itching   Prior to Admission medications   Medication Sig Start Date End Date Taking? Authorizing Provider  ACCU-CHEK AVIVA PLUS test strip USE TO CHECK BLOOD SUGAR LEVELS 2 TIMES DAILY 04/18/19  Yes Greene, Jeffrey R, MD  amLODipine (NORVASC) 5 MG tablet Take 1 tablet (5 mg total) by mouth daily. 12/14/19  Yes Greene, Jeffrey R, MD  Blood Glucose Monitoring Suppl KIT One Touch Ultra or may substitute for what insurance covers.  May check once per day 09/09/14  Yes Greene, Jeffrey R, MD  Dulaglutide (TRULICITY) 3 MG/0.5ML SOPN Inject 3 mg as directed once a week. 12/14/19  Yes Greene, Jeffrey R, MD  glimepiride (AMARYL) 4 MG tablet Take 2 tablets (8 mg   total) by mouth daily with breakfast. 12/14/19  Yes [name], MD  losartan-hydrochlorothiazide (HYZAAR) 100-25 MG tablet Take 1 tablet by mouth daily. 12/14/19  Yes [name], MD  metFORMIN (GLUCOPHAGE) 1000 MG tablet Take 1 tablet (1,000 mg total) by mouth 2 (two) times daily with a meal. 12/14/19  Yes [name], MD  omeprazole (PRILOSEC) 20 MG capsule TAKE 1 CAPSULE(20 MG) BY MOUTH DAILY 12/08/19  Yes [name], MD  simvastatin (ZOCOR) 20 MG tablet Take 1 tablet (20 mg total) by mouth at bedtime. 12/14/19  Yes [name], MD   Social History   Socioeconomic History  . Marital status: Married    Spouse name: Not on file  . Number of  children: 3  . Years of education: Not on file  . Highest education level: Not on file  Occupational History  . Occupation: Airline pilot: [employer]  Tobacco Use  . Smoking status: Never Smoker  . Smokeless tobacco: Never Used  Vaping Use  . Vaping Use: Never used  Substance and Sexual Activity  . Alcohol use: No  . Drug use: No  . Sexual activity: Yes  Other Topics Concern  . Not on file  Social History Narrative   Married. Education: [employer]. Exercise: Walks twice a week for 1-2 hours.   Social Determinants of Health   Financial Resource Strain:   . Difficulty of Paying Living Expenses: Not on file  Food Insecurity:   . Worried About Charity fundraiser in the Last Year: Not on file  . Ran Out of Food in the Last Year: Not on file  Transportation Needs:   . Lack of Transportation (Medical): Not on file  . Lack of Transportation (Non-Medical): Not on file  Physical Activity:   . Days of Exercise per Week: Not on file  . Minutes of Exercise per Session: Not on file  Stress:   . Feeling of Stress : Not on file  Social Connections:   . Frequency of Communication with Friends and Family: Not on file  . Frequency of Social Gatherings with Friends and Family: Not on file  . Attends Religious Services: Not on file  . Active Member of Clubs or Organizations: Not on file  . Attends Archivist Meetings: Not on file  . Marital Status: Not on file  Intimate Partner Violence:   . Fear of Current or Ex-Partner: Not on file  . Emotionally Abused: Not on file  . Physically Abused: Not on file  . Sexually Abused: Not on file    Review of Systems  13 point review of systems per patient health survey noted.  Negative other than as indicated above or in HPI.   Objective:   Vitals:   05/30/20 0931 05/30/20 0944  BP: (!) 147/100 130/86  Pulse: 89   Temp: 97.6 F (36.4 C)   TempSrc: Temporal   SpO2: 100%   Weight: 199 lb (90.3 kg)   Height: 5' 7" (1.702 m)       Physical Exam Vitals reviewed.  Constitutional:      Appearance: He is well-developed.  HENT:     Head: Normocephalic and atraumatic.     Right Ear: External ear normal.     Left Ear: External ear normal.  Eyes:     Conjunctiva/sclera: Conjunctivae normal.     Pupils: Pupils are equal, round, and reactive to light.  Neck:     Thyroid: No thyromegaly.  Cardiovascular:     Rate and Rhythm: Normal rate and regular rhythm.     Heart sounds: Normal heart sounds.  Pulmonary:     Effort: Pulmonary effort is normal. No respiratory distress.     Breath sounds: Normal breath sounds. No wheezing.  Abdominal:     General: There is no distension.     Palpations: Abdomen is soft.     Tenderness: There is no abdominal tenderness.  Musculoskeletal:        General: No tenderness. Normal range of motion.     Cervical back: Normal range of motion and neck supple.  Lymphadenopathy:     Cervical: No cervical adenopathy.  Skin:    General: Skin is warm and dry.  Neurological:     Mental Status: He is alert and oriented to person, place, and time.     Deep Tendon Reflexes: Reflexes are normal and symmetric.  Psychiatric:        Behavior: Behavior normal.        Assessment & Plan:  Wayne Perez is a 59 y.o. male . Annual physical exam  - -anticipatory guidance as below in AVS, screening labs above. Health maintenance items as above in HPI discussed/recommended as applicable.    Type 2 diabetes mellitus with hyperglycemia, without long-term current use of insulin (HCC) - Plan: losartan-hydrochlorothiazide (HYZAAR) 100-25 MG tablet, metFORMIN (GLUCOPHAGE) 1000 MG tablet, simvastatin (ZOCOR) 20 MG tablet, Dulaglutide (TRULICITY) 3 JK/9.3OI SOPN, amLODipine (NORVASC) 5 MG tablet, glimepiride (AMARYL) 4 MG tablet  -With improved A1c last visit, he declined further changes.  Repeat A1c at follow-up in 2 months, consider increasing Trulicity at that time.  Need for shingles vaccine - Plan:  Varicella-zoster vaccine subcutaneous  Gastroesophageal reflux disease - Plan: omeprazole (PRILOSEC) 20 MG capsule  -Stable, discussed previously, meds refilled.  Essential hypertension - Plan: losartan-hydrochlorothiazide (HYZAAR) 100-25 MG tablet  -Stable, discussed at August office visit, meds refilled.  Recent labs reviewed.  Meds ordered this encounter  Medications  . omeprazole (PRILOSEC) 20 MG capsule    Sig: TAKE 1 CAPSULE(20 MG) BY MOUTH DAILY    Dispense:  90 capsule    Refill:  2  . losartan-hydrochlorothiazide (HYZAAR) 100-25 MG tablet    Sig: Take 1 tablet by mouth daily.    Dispense:  90 tablet    Refill:  1  . metFORMIN (GLUCOPHAGE) 1000 MG tablet    Sig: Take 1 tablet (1,000 mg total) by mouth 2 (two) times daily with a meal.    Dispense:  180 tablet    Refill:  1  . simvastatin (ZOCOR) 20 MG tablet    Sig: Take 1 tablet (20 mg total) by mouth at bedtime.    Dispense:  90 tablet    Refill:  1  . Dulaglutide (TRULICITY) 3 ZT/2.4PY SOPN    Sig: Inject 3 mg as directed once a week.    Dispense:  6 mL    Refill:  1  . amLODipine (NORVASC) 5 MG tablet    Sig: Take 1 tablet (5 mg total) by mouth daily.    Dispense:  90 tablet    Refill:  1  . glimepiride (AMARYL) 4 MG tablet    Sig: Take 2 tablets (8 mg total) by mouth daily with breakfast.    Dispense:  180 tablet    Refill:  1   Patient Instructions    Recheck in 2 months for repeat diabetes test. If still elevated at that time, I would increase trulicity.  First of 2 shingles vaccines given today.  No change in medications at this time.  Keep follow-up as planned with neurologist, and can discuss other treatments for erectile dysfunction with urology.  If diabetes still elevated at next visit, change in medications may also help.  Thank you for coming in today.  Let me know if there are questions.  Keeping you healthy  Get these tests  Blood pressure- Have your blood pressure checked once a year by your  healthcare provider.  Normal blood pressure is 120/80  Weight- Have your body mass index (BMI) calculated to screen for obesity.  BMI is a measure of body fat based on height and weight. You can also calculate your own BMI at [email or web address].com/bmi/.  Cholesterol- Have your cholesterol checked every year.  Diabetes- Have your blood sugar checked regularly if you have high blood pressure, high cholesterol, have a family history of diabetes or if you are overweight.  Screening for Colon Cancer- Colonoscopy starting at age 50.  Screening may begin sooner depending on your family history and other health conditions. Follow up colonoscopy as directed by your Gastroenterologist.  Screening for Prostate Cancer- Both blood work (PSA) and a rectal exam help screen for Prostate Cancer.  Screening begins at age 40 with African-American men and at age 50 with Caucasian men.  Screening may begin sooner depending on your family history.  Take these medicines  Aspirin- One aspirin daily can help prevent Heart disease and Stroke.  Flu shot- Every fall.  Tetanus- Every 10 years.  Shingles vaccine, given today.  Pneumonia shot- Once after the age of 65; if you are younger than 65, ask your healthcare provider if you need a Pneumonia shot.  Take these steps  Don't smoke- If you do smoke, talk to your doctor about quitting.  For tips on how to quit, go to www.smokefree.gov or call 1-800-QUIT-NOW.  Be physically active- Exercise 5 days a week for at least 30 minutes.  If you are not already physically active start slow and gradually work up to 30 minutes of moderate physical activity.  Examples of moderate activity include walking briskly, mowing the yard, dancing, swimming, bicycling, etc.  Eat a healthy diet- Eat a variety of healthy food such as fruits, vegetables, low fat milk, low fat cheese, yogurt, lean meant, poultry, fish, beans, tofu, etc. For more information go to  www.thenutritionsource.org  Drink alcohol in moderation- Limit alcohol intake to less than two drinks a day. Never drink and drive.  Dentist- Brush and floss twice daily; visit your dentist twice a year.  Depression- Your emotional health is as important as your physical health. If you're feeling down, or losing interest in things you would normally enjoy please talk to your healthcare provider.  Eye exam- Visit your eye doctor every year.  Safe sex- If you may be exposed to a sexually transmitted infection, use a condom.  Seat belts- Seat belts can save your life; always wear one.  Smoke/Carbon Monoxide detectors- These detectors need to be installed on the appropriate level of your home.  Replace batteries at least once a year.  Skin cancer- When out in the sun, cover up and use sunscreen 15 SPF or higher.  Violence- If anyone is threatening you, please tell your healthcare provider.  Living Will/ Health care power of attorney- Speak with your healthcare provider and family.     If you have lab work done today you will be contacted with your lab results within the next   2 weeks.  If you have not heard from us then please contact us. The fastest way to get your results is to register for My Chart.   IF you received an x-ray today, you will receive an invoice from [facility]. Please contact [facility] at [phone] with questions or concerns regarding your invoice.   IF you received labwork today, you will receive an invoice from [facility]. Please contact [facility] at [phone] with questions or concerns regarding your invoice.   Our billing staff will not be able to assist you with questions regarding bills from these companies.  You will be contacted with the lab results as soon as they are available. The fastest way to get your results is to activate your My Chart account. Instructions are located on the last page of this paperwork. If you have not heard from  us regarding the results in 2 weeks, please contact this office.         Signed, [name], MD [facility]  

## 2020-05-30 NOTE — Patient Instructions (Addendum)
Recheck in 2 months for repeat diabetes test. If still elevated at that time, I would increase trulicity.   First of 2 shingles vaccines given today.  No change in medications at this time.  Keep follow-up as planned with neurologist, and can discuss other treatments for erectile dysfunction with urology.  If diabetes still elevated at next visit, change in medications may also help.  Thank you for coming in today.  Let me know if there are questions.  Keeping you healthy  Get these tests  Blood pressure- Have your blood pressure checked once a year by your healthcare provider.  Normal blood pressure is 120/80  Weight- Have your body mass index (BMI) calculated to screen for obesity.  BMI is a measure of body fat based on height and weight. You can also calculate your own BMI at ViewBanking.si.  Cholesterol- Have your cholesterol checked every year.  Diabetes- Have your blood sugar checked regularly if you have high blood pressure, high cholesterol, have a family history of diabetes or if you are overweight.  Screening for Colon Cancer- Colonoscopy starting at age 78.  Screening may begin sooner depending on your family history and other health conditions. Follow up colonoscopy as directed by your Gastroenterologist.  Screening for Prostate Cancer- Both blood work (PSA) and a rectal exam help screen for Prostate Cancer.  Screening begins at age 43 with African-American men and at age 57 with Caucasian men.  Screening may begin sooner depending on your family history.  Take these medicines  Aspirin- One aspirin daily can help prevent Heart disease and Stroke.  Flu shot- Every fall.  Tetanus- Every 10 years.  Shingles vaccine, given today.  Pneumonia shot- Once after the age of 63; if you are younger than 15, ask your healthcare provider if you need a Pneumonia shot.  Take these steps  Don't smoke- If you do smoke, talk to your doctor about quitting.  For tips on how to  quit, go to www.smokefree.gov or call 1-800-QUIT-NOW.  Be physically active- Exercise 5 days a week for at least 30 minutes.  If you are not already physically active start slow and gradually work up to 30 minutes of moderate physical activity.  Examples of moderate activity include walking briskly, mowing the yard, dancing, swimming, bicycling, etc.  Eat a healthy diet- Eat a variety of healthy food such as fruits, vegetables, low fat milk, low fat cheese, yogurt, lean meant, poultry, fish, beans, tofu, etc. For more information go to www.thenutritionsource.org  Drink alcohol in moderation- Limit alcohol intake to less than two drinks a day. Never drink and drive.  Dentist- Brush and floss twice daily; visit your dentist twice a year.  Depression- Your emotional health is as important as your physical health. If you're feeling down, or losing interest in things you would normally enjoy please talk to your healthcare provider.  Eye exam- Visit your eye doctor every year.  Safe sex- If you may be exposed to a sexually transmitted infection, use a condom.  Seat belts- Seat belts can save your life; always wear one.  Smoke/Carbon Monoxide detectors- These detectors need to be installed on the appropriate level of your home.  Replace batteries at least once a year.  Skin cancer- When out in the sun, cover up and use sunscreen 15 SPF or higher.  Violence- If anyone is threatening you, please tell your healthcare provider.  Living Will/ Health care power of attorney- Speak with your healthcare provider and family.  If you have lab work done today you will be contacted with your lab results within the next 2 weeks.  If you have not heard from [place] then please contact us. The fastest way to get your results is to register for My Chart.   IF you received an x-ray today, you will receive an invoice from [facility]. Please contact [facility] at [phone] with questions or  concerns regarding your invoice.   IF you received labwork today, you will receive an invoice from [facility]. Please contact [facility] at [phone] with questions or concerns regarding your invoice.   Our billing staff will not be able to assist you with questions regarding bills from these companies.  You will be contacted with the lab results as soon as they are available. The fastest way to get your results is to activate your My Chart account. Instructions are located on the last page of this paperwork. If you have not heard from [place] regarding the results in 2 weeks, please contact this office.

## 2020-06-21 ENCOUNTER — Other Ambulatory Visit: Payer: Self-pay | Admitting: Family Medicine

## 2020-06-21 DIAGNOSIS — I10 Essential (primary) hypertension: Secondary | ICD-10-CM

## 2020-06-21 DIAGNOSIS — E1165 Type 2 diabetes mellitus with hyperglycemia: Secondary | ICD-10-CM

## 2020-07-30 ENCOUNTER — Ambulatory Visit: Payer: Managed Care, Other (non HMO) | Admitting: Family Medicine

## 2020-08-22 ENCOUNTER — Encounter: Payer: Self-pay | Admitting: Family Medicine

## 2020-08-22 ENCOUNTER — Ambulatory Visit: Payer: Managed Care, Other (non HMO) | Admitting: Family Medicine

## 2020-08-22 ENCOUNTER — Other Ambulatory Visit: Payer: Self-pay

## 2020-08-22 VITALS — BP 129/89 | HR 80 | Temp 97.6°F | Ht 69.0 in | Wt 203.0 lb

## 2020-08-22 DIAGNOSIS — E1165 Type 2 diabetes mellitus with hyperglycemia: Secondary | ICD-10-CM

## 2020-08-22 NOTE — Progress Notes (Signed)
Subjective:  Patient ID: Wayne Perez, male    DOB: July 24, 1961  Age: 59 y.o. MRN: [id number]  CC:  Chief Complaint  Patient presents with  . Follow-up    On diabetes and lab work review. Pt reports he hasn't noticed an issues with this condition since last OV. Pt reports he checks his BS every now and then at home, but readings are inconsistent, but pt checks the BS at different times of the day.pt states no other issues to report at this time.    HPI [name] presents for   Diabetes: Complicated by hyperglycemia.  Trulicity has been increased prior to his August visit to 3 mg weekly, and was continued on Metformin 1000 mg twice daily and glimepiride 8 mg daily.  He is on statin and ARB.  Improved A1c of 7.7 in August, but recommended Trulicity increase.  Still on 3 mg weekly. No n/v, no side effects with meds. He is on ARB and statin.  Home readings: fasting:130-150.  Postprandial: 160, no recent 200's.  Microalbumin: Normal ratio in July 2020 Optho, foot exam, pneumovax: Due for ophthalmology eval - plans to schedule next year.  Covid booster: not yet. Planning on it when ready - initial vaccine 7/7, 7/30.   Lab Results  Component Value Date   HGBA1C 7.7 (H) 04/11/2020   HGBA1C 9.2 (H) 12/14/2019   HGBA1C 8.2 (H) 07/27/2019   Lab Results  Component Value Date   MICROALBUR 5.7 (H) 03/03/2015   LDLCALC 71 04/11/2020   CREATININE 0.99 04/11/2020    Prostate cancer, erectile dysfunction: Followed by urology, Dr. Sherryll Burger with [facility].  Status post prostatectomy, the ED post prostatectomy.  Was given BCG at his last visit December 8, 6 months planned follow-up with PSA.    History Patient Active Problem List   Diagnosis Date Noted  . Erectile dysfunction due to arterial insufficiency 08/07/2015  . Varicocele 08/07/2015  . Diabetes ([place]) 07/31/2012  . HTN (hypertension) 07/31/2012   Past Medical History:  Diagnosis Date  . Diabetes mellitus without complication  ([place])   . GERD (gastroesophageal reflux disease)   . Hyperlipidemia   . Hypertension    Past Surgical History:  Procedure Laterality Date  . COLONOSCOPY     Allergies  Allergen Reactions  . Ace Inhibitors Itching   Prior to Admission medications   Medication Sig Start Date End Date Taking? Authorizing Provider  ACCU-CHEK AVIVA PLUS test strip USE TO CHECK BLOOD SUGAR LEVELS 2 TIMES DAILY 04/18/19  Yes [name], MD  amLODipine (NORVASC) 5 MG tablet Take 1 tablet (5 mg total) by mouth daily. 05/30/20  Yes [name], MD  Blood Glucose Monitoring Suppl KIT One Touch Ultra or may substitute for what insurance covers.  May check once per day 09/09/14  Yes [name], MD  Dulaglutide (TRULICITY) 3 GD/9.2EQ SOPN Inject 3 mg as directed once a week. 05/30/20  Yes [name], MD  glimepiride (AMARYL) 4 MG tablet Take 2 tablets (8 mg total) by mouth daily with breakfast. 05/30/20  Yes [name], MD  losartan-hydrochlorothiazide (HYZAAR) 100-25 MG tablet Take 1 tablet by mouth daily. 05/30/20  Yes [name], MD  metFORMIN (GLUCOPHAGE) 1000 MG tablet Take 1 tablet (1,000 mg total) by mouth 2 (two) times daily with a meal. 05/30/20  Yes [name], MD  omeprazole (PRILOSEC) 20 MG capsule TAKE 1 CAPSULE(20 MG) BY MOUTH DAILY 05/30/20  Yes [name], MD  simvastatin (  ZOCOR) 20 MG tablet Take 1 tablet (20 mg total) by mouth at bedtime. 05/30/20  Yes [name], MD   Social History   Socioeconomic History  . Marital status: Married    Spouse name: Not on file  . Number of children: 3  . Years of education: Not on file  . Highest education level: Not on file  Occupational History  . Occupation: Airline pilot: [employer]  Tobacco Use  . Smoking status: Never Smoker  . Smokeless tobacco: Never Used  Vaping Use  . Vaping Use: Never used  Substance and Sexual Activity  . Alcohol use: No  . Drug use: No  . Sexual activity: Yes  Other  Topics Concern  . Not on file  Social History Narrative   Married. Education: [employer]. Exercise: Walks twice a week for 1-2 hours.   Social Determinants of Health   Financial Resource Strain: Not on file  Food Insecurity: Not on file  Transportation Needs: Not on file  Physical Activity: Not on file  Stress: Not on file  Social Connections: Not on file  Intimate Partner Violence: Not on file    Review of Systems  Constitutional: Negative for fatigue and unexpected weight change.  Eyes: Negative for visual disturbance.  Respiratory: Negative for cough, chest tightness and shortness of breath.   Cardiovascular: Negative for chest pain, palpitations and leg swelling.  Gastrointestinal: Negative for abdominal pain and blood in stool.  Neurological: Negative for dizziness, light-headedness and headaches.     Objective:   Vitals:   08/22/20 1025  BP: 129/89  Pulse: 80  Temp: 97.6 F (36.4 C)  TempSrc: Temporal  SpO2: 98%  Weight: 203 lb (92.1 kg)  Height: 5' 9"  (1.753 m)     Physical Exam Vitals reviewed.  Constitutional:      Appearance: He is well-developed and well-nourished.  HENT:     Head: Normocephalic and atraumatic.  Eyes:     Extraocular Movements: EOM normal.     Pupils: Pupils are equal, round, and reactive to light.  Neck:     Vascular: No carotid bruit or JVD.  Cardiovascular:     Rate and Rhythm: Normal rate and regular rhythm.     Heart sounds: Normal heart sounds. No murmur heard.   Pulmonary:     Effort: Pulmonary effort is normal.     Breath sounds: Normal breath sounds. No rales.  Musculoskeletal:        General: No edema.  Skin:    General: Skin is warm and dry.  Neurological:     Mental Status: He is alert and oriented to person, place, and time.  Psychiatric:        Mood and Affect: Mood and affect and mood normal.        Behavior: Behavior normal.        Assessment & Plan:  [name] is a 59 y.o. male . Type 2 diabetes  mellitus with hyperglycemia, without long-term current use of insulin (HCC) - Plan: Hemoglobin A1c, Microalbumin / creatinine urine ratio  -Improved but still elevated  A1c last visit, will likely need to increase Trulicity to 4.5 mg weekly but will check A1c first.  No other med changes for now.  Has been tolerating current regimen but potential side effects of higher dose of GLP were discussed.  Recheck 3 months.  No orders of the defined types were placed in this encounter.  Patient Instructions    I do recommend  covid booster in February.  If A1C is still elevated, I will increase the Trulicity. Let me know if there are side effects at higher dose. No other changes today.      If you have lab work done today you will be contacted with your lab results within the next 2 weeks.  If you have not heard from [place] then please contact us. The fastest way to get your results is to register for My Chart.   IF you received an x-ray today, you will receive an invoice from [facility]. Please contact [facility] at [phone] with questions or concerns regarding your invoice.   IF you received labwork today, you will receive an invoice from [place]. Please contact [facility] at [phone] with questions or concerns regarding your invoice.   Our billing staff will not be able to assist you with questions regarding bills from these companies.  You will be contacted with the lab results as soon as they are available. The fastest way to get your results is to activate your My Chart account. Instructions are located on the last page of this paperwork. If you have not heard from [place] regarding the results in 2 weeks, please contact this office.         Signed, [name], MD [facility]

## 2020-08-22 NOTE — Patient Instructions (Addendum)
  I do recommend covid booster in February.  If A1C is still elevated, I will increase the Trulicity. Let me know if there are side effects at higher dose. No other changes today.      If you have lab work done today you will be contacted with your lab results within the next 2 weeks.  If you have not heard from [place] then please contact us. The fastest way to get your results is to register for My Chart.   IF you received an x-ray today, you will receive an invoice from [facility]. Please contact [facility] at [phone] with questions or concerns regarding your invoice.   IF you received labwork today, you will receive an invoice from [place]. Please contact [facility] at [phone] with questions or concerns regarding your invoice.   Our billing staff will not be able to assist you with questions regarding bills from these companies.  You will be contacted with the lab results as soon as they are available. The fastest way to get your results is to activate your My Chart account. Instructions are located on the last page of this paperwork. If you have not heard from [place] regarding the results in 2 weeks, please contact this office.

## 2020-08-23 LAB — MICROALBUMIN / CREATININE URINE RATIO
Creatinine, Urine: 67.6 mg/dL
Microalb/Creat Ratio: 60 mg/g creat — ABNORMAL HIGH (ref 0–29)
Microalbumin, Urine: 40.3 ug/mL

## 2020-08-23 LAB — HEMOGLOBIN A1C
Est. average glucose Bld gHb Est-mCnc: 171 mg/dL
Hgb A1c MFr Bld: 7.6 % — ABNORMAL HIGH (ref 4.8–5.6)

## 2020-09-05 ENCOUNTER — Other Ambulatory Visit: Payer: Self-pay | Admitting: Family Medicine

## 2020-09-05 DIAGNOSIS — E1165 Type 2 diabetes mellitus with hyperglycemia: Secondary | ICD-10-CM

## 2020-09-05 MED ORDER — TRULICITY 4.5 MG/0.5ML ~~LOC~~ SOAJ: 4.5000 mg | SUBCUTANEOUS | 1 refills | Status: DC

## 2020-09-05 NOTE — Progress Notes (Signed)
See labs 

## 2020-11-01 ENCOUNTER — Other Ambulatory Visit: Payer: Self-pay | Admitting: Family Medicine

## 2020-11-01 DIAGNOSIS — E1165 Type 2 diabetes mellitus with hyperglycemia: Secondary | ICD-10-CM

## 2020-11-01 NOTE — Telephone Encounter (Signed)
Requested Prescriptions  Pending Prescriptions Disp Refills  . ACCU-CHEK AVIVA PLUS test strip [Pharmacy Med Name: [place] 100S] 100 strip 3    Sig: USE TO CHECK BLOOD SUGAR LEVELS 2 TIMES DAILY     Endocrinology: Diabetes - Testing Supplies Passed - 11/01/2020  2:03 PM      Passed - Valid encounter within last 12 months    Recent Outpatient Visits          2 months ago Type 2 diabetes mellitus with hyperglycemia, without long-term current use of insulin [facility])   [facility] [place], Ranell Patrick, MD   5 months ago Annual physical exam   [facility] [place], Ranell Patrick, MD   6 months ago Essential hypertension   [facility] [place], Ranell Patrick, MD   10 months ago Type 2 diabetes mellitus with hyperglycemia, without long-term current use of insulin [facility])   [facility] [place], Ranell Patrick, MD   1 year ago Type 2 diabetes mellitus with hyperglycemia, without long-term current use of insulin [facility])   [facility] [place], Ranell Patrick, MD      Future Appointments            In 2 weeks Carlota Raspberry Ranell Patrick, MD [facility] at [place], [facility]

## 2020-11-21 ENCOUNTER — Ambulatory Visit: Payer: Managed Care, Other (non HMO) | Admitting: Family Medicine

## 2020-11-22 ENCOUNTER — Other Ambulatory Visit: Payer: Self-pay | Admitting: Family Medicine

## 2020-11-22 DIAGNOSIS — E1165 Type 2 diabetes mellitus with hyperglycemia: Secondary | ICD-10-CM

## 2020-11-22 NOTE — Telephone Encounter (Signed)
Requested Prescriptions  Pending Prescriptions Disp Refills  . TRULICITY 3 FG/1.8EX SOPN [Pharmacy Med Name: TRULICITY 3MG /0.5ML SDP 0.5ML] 6 mL     Sig: INJECT 3 MG INTO SKIN AS DIRECTED ONCE WEEKLY     Endocrinology:  Diabetes - GLP-1 Receptor Agonists Passed - 11/22/2020 10:24 AM      Passed - HBA1C is between 0 and 7.9 and within 180 days    Hgb A1c MFr Bld  Date Value Ref Range Status  08/22/2020 7.6 (H) 4.8 - 5.6 % Final    Comment:             Prediabetes: 5.7 - 6.4          Diabetes: >6.4          Glycemic control for adults with diabetes: <7.0          Passed - Valid encounter within last 6 months    Recent Outpatient Visits          3 months ago Type 2 diabetes mellitus with hyperglycemia, without long-term current use of insulin ([place])   [facility] [place], Ranell Patrick, MD   5 months ago Annual physical exam   [facility] [place], Ranell Patrick, MD   7 months ago Essential hypertension   [facility] [place], Ranell Patrick, MD   11 months ago Type 2 diabetes mellitus with hyperglycemia, without long-term current use of insulin [facility])   [facility] [place], Ranell Patrick, MD   1 year ago Type 2 diabetes mellitus with hyperglycemia, without long-term current use of insulin [facility])   [facility] [place], Ranell Patrick, MD             . glimepiride (AMARYL) 4 MG tablet [Pharmacy Med Name: GLIMEPIRIDE 4MG  TABLETS] 180 tablet 0    Sig: TAKE 2 TABLETS(8 MG) BY MOUTH DAILY WITH BREAKFAST     Endocrinology:  Diabetes - Sulfonylureas Passed - 11/22/2020 10:24 AM      Passed - HBA1C is between 0 and 7.9 and within 180 days    Hgb A1c MFr Bld  Date Value Ref Range Status  08/22/2020 7.6 (H) 4.8 - 5.6 % Final    Comment:             Prediabetes: 5.7 - 6.4          Diabetes: >6.4          Glycemic control for adults with diabetes: <7.0          Passed - Valid encounter within last 6 months    Recent Outpatient Visits          3 months ago  Type 2 diabetes mellitus with hyperglycemia, without long-term current use of insulin [facility])   [facility] [place], Ranell Patrick, MD   5 months ago Annual physical exam   [facility] [place], Ranell Patrick, MD   7 months ago Essential hypertension   [facility] [place], Ranell Patrick, MD   11 months ago Type 2 diabetes mellitus with hyperglycemia, without long-term current use of insulin [facility])   [facility] [place], Ranell Patrick, MD   1 year ago Type 2 diabetes mellitus with hyperglycemia, without long-term current use of insulin [facility])   [facility] [place], Ranell Patrick, MD

## 2020-11-23 ENCOUNTER — Other Ambulatory Visit: Payer: Self-pay | Admitting: Family Medicine

## 2020-11-23 DIAGNOSIS — E1165 Type 2 diabetes mellitus with hyperglycemia: Secondary | ICD-10-CM

## 2020-11-23 NOTE — Telephone Encounter (Signed)
Requested Prescriptions  Pending Prescriptions Disp Refills  . simvastatin (ZOCOR) 20 MG tablet [Pharmacy Med Name: SIMVASTATIN 20MG  TABLETS] 90 tablet 1    Sig: TAKE 1 TABLET(20 MG) BY MOUTH AT BEDTIME     Cardiovascular:  Antilipid - Statins Failed - 11/23/2020  6:35 AM      Failed - LDL in normal range and within 360 days    LDL Chol Calc (NIH)  Date Value Ref Range Status  04/11/2020 71 0 - 99 mg/dL Final         Passed - Total Cholesterol in normal range and within 360 days    Cholesterol, Total  Date Value Ref Range Status  04/11/2020 134 100 - 199 mg/dL Final         Passed - HDL in normal range and within 360 days    HDL  Date Value Ref Range Status  04/11/2020 43 >39 mg/dL Final         Passed - Triglycerides in normal range and within 360 days    Triglycerides  Date Value Ref Range Status  04/11/2020 108 0 - 149 mg/dL Final         Passed - Patient is not pregnant      Passed - Valid encounter within last 12 months    Recent Outpatient Visits          3 months ago Type 2 diabetes mellitus with hyperglycemia, without long-term current use of insulin ([place])   [facility] [place], Ranell Patrick, MD   5 months ago Annual physical exam   [facility] [place], Ranell Patrick, MD   7 months ago Essential hypertension   [facility] [place], Ranell Patrick, MD   11 months ago Type 2 diabetes mellitus with hyperglycemia, without long-term current use of insulin [facility])   [facility] [place], Ranell Patrick, MD   1 year ago Type 2 diabetes mellitus with hyperglycemia, without long-term current use of insulin [facility])   [facility] [place], Ranell Patrick, MD             . amLODipine ([place]) 5 MG tablet [Pharmacy Med Name: AMLODIPINE BESYLATE 5MG  TABLETS] 90 tablet 0    Sig: TAKE 1 TABLET(5 MG) BY MOUTH DAILY     Cardiovascular:  Calcium Channel Blockers Passed - 11/23/2020  6:35 AM      Passed - Last BP in normal range    BP Readings from  Last 1 Encounters:  08/22/20 129/89         Passed - Valid encounter within last 6 months    Recent Outpatient Visits          3 months ago Type 2 diabetes mellitus with hyperglycemia, without long-term current use of insulin [facility])   [facility] [place], Ranell Patrick, MD   5 months ago Annual physical exam   [facility] [place], Ranell Patrick, MD   7 months ago Essential hypertension   [facility] [place], Ranell Patrick, MD   11 months ago Type 2 diabetes mellitus with hyperglycemia, without long-term current use of insulin [facility])   [facility] [place], Ranell Patrick, MD   1 year ago Type 2 diabetes mellitus with hyperglycemia, without long-term current use of insulin [facility])   [facility] [place], Ranell Patrick, MD

## 2021-01-20 ENCOUNTER — Other Ambulatory Visit: Payer: Self-pay | Admitting: Family Medicine

## 2021-01-20 DIAGNOSIS — E1165 Type 2 diabetes mellitus with hyperglycemia: Secondary | ICD-10-CM

## 2021-01-20 DIAGNOSIS — I10 Essential (primary) hypertension: Secondary | ICD-10-CM

## 2021-01-22 ENCOUNTER — Encounter: Payer: Self-pay | Admitting: Family Medicine

## 2021-01-22 ENCOUNTER — Ambulatory Visit: Payer: Managed Care, Other (non HMO) | Admitting: Family Medicine

## 2021-01-22 ENCOUNTER — Other Ambulatory Visit: Payer: Self-pay

## 2021-01-22 VITALS — BP 124/68 | HR 85 | Temp 98.0°F | Resp 16 | Ht 69.0 in | Wt 200.4 lb

## 2021-01-22 DIAGNOSIS — K219 Gastro-esophageal reflux disease without esophagitis: Secondary | ICD-10-CM

## 2021-01-22 DIAGNOSIS — R809 Proteinuria, unspecified: Secondary | ICD-10-CM

## 2021-01-22 DIAGNOSIS — E1129 Type 2 diabetes mellitus with other diabetic kidney complication: Secondary | ICD-10-CM | POA: Diagnosis not present

## 2021-01-22 DIAGNOSIS — E1165 Type 2 diabetes mellitus with hyperglycemia: Secondary | ICD-10-CM | POA: Diagnosis not present

## 2021-01-22 DIAGNOSIS — E785 Hyperlipidemia, unspecified: Secondary | ICD-10-CM | POA: Diagnosis not present

## 2021-01-22 DIAGNOSIS — I10 Essential (primary) hypertension: Secondary | ICD-10-CM

## 2021-01-22 LAB — LIPID PANEL
Cholesterol: 140 mg/dL (ref 0–200)
HDL: 47.3 mg/dL (ref >39.00)
LDL Cholesterol: 67 mg/dL (ref 0–99)
NonHDL: 92.78
Total CHOL/HDL Ratio: 3
Triglycerides: 130 mg/dL (ref 0.0–149.0)
VLDL: 26 mg/dL (ref 0.0–40.0)

## 2021-01-22 LAB — COMPREHENSIVE METABOLIC PANEL
ALT: 34 U/L (ref 0–53)
AST: 29 U/L (ref 0–37)
Albumin: 4.2 g/dL (ref 3.5–5.2)
Alkaline Phosphatase: 60 U/L (ref 39–117)
BUN: 13 mg/dL (ref 6–23)
CO2: 28 mEq/L (ref 19–32)
Calcium: 9.7 mg/dL (ref 8.4–10.5)
Chloride: 101 mEq/L (ref 96–112)
Creatinine, Ser: 0.98 mg/dL (ref 0.40–1.50)
GFR: 85.64 mL/min (ref >60.00)
Glucose, Bld: 114 mg/dL — ABNORMAL HIGH (ref 70–99)
Potassium: 3.9 mEq/L (ref 3.5–5.1)
Sodium: 135 mEq/L (ref 135–145)
Total Bilirubin: 0.4 mg/dL (ref 0.2–1.2)
Total Protein: 7.8 g/dL (ref 6.0–8.3)

## 2021-01-22 LAB — HEMOGLOBIN A1C: Hgb A1c MFr Bld: 7.5 % — ABNORMAL HIGH (ref 4.6–6.5)

## 2021-01-22 MED ORDER — LOSARTAN POTASSIUM-HCTZ 100-25 MG PO TABS: 1.0000 | ORAL_TABLET | Freq: Every day | ORAL | 2 refills | Status: DC

## 2021-01-22 MED ORDER — OMEPRAZOLE 20 MG PO CPDR: DELAYED_RELEASE_CAPSULE | ORAL | 2 refills | Status: DC

## 2021-01-22 MED ORDER — GLIMEPIRIDE 4 MG PO TABS: ORAL_TABLET | ORAL | 2 refills | Status: DC

## 2021-01-22 MED ORDER — METFORMIN HCL 1000 MG PO TABS
1000.0000 mg | ORAL_TABLET | Freq: Two times a day (BID) | ORAL | 2 refills | Status: DC
Start: 2021-01-22 — End: 2021-06-04

## 2021-01-22 MED ORDER — AMLODIPINE BESYLATE 5 MG PO TABS: ORAL_TABLET | ORAL | 2 refills | Status: DC

## 2021-01-22 MED ORDER — TRULICITY 4.5 MG/0.5ML ~~LOC~~ SOAJ
4.5000 mg | SUBCUTANEOUS | 2 refills | Status: DC
Start: 2021-01-22 — End: 2021-06-04

## 2021-01-22 MED ORDER — SIMVASTATIN 20 MG PO TABS: ORAL_TABLET | ORAL | 2 refills | Status: DC

## 2021-01-22 NOTE — Patient Instructions (Addendum)
No med changes for now. I do recommend Covid 19 vaccine booster. Follow up in 3 months, but let me know if there are questions sooner.   Thanks for coming in today and take care.

## 2021-01-22 NOTE — Progress Notes (Signed)
Subjective:  Patient ID: Wayne Perez, male    DOB: July 05, 1961  Age: 60 y.o. MRN: [id number]  CC:  Chief Complaint  Patient presents with  . Diabetes    Pt doing well today, needs refills upcoming, pt denies any light headed or dizzy spells.     HPI Wayne Perez presents for   Diabetes: With hyperglycemia, microalbuminuria he is on statin and ARB. Treated with metformin 1000 mg twice daily, glimepiride 8 mg daily, Trulicity -increased from 3 to 4.5 mg weekly in December. No new side effects with meds.  Home readings, fasting: 130-140 Home readings, postprandial: none No symptomatic hypoglycemia.  Microalbumin: Elevated ratio of 60 in December 2021 Optho, foot exam, pneumovax: Due for ophthalmology exam - appt June 17th, COVID-19 booster - has not had.   Lab Results  Component Value Date   HGBA1C 7.6 (H) 08/22/2020   HGBA1C 7.7 (H) 04/11/2020   HGBA1C 9.2 (H) 12/14/2019   Lab Results  Component Value Date   MICROALBUR 5.7 (H) 03/03/2015   LDLCALC 71 04/11/2020   CREATININE 0.99 04/11/2020    Hypertension: Losartan hct 100/33m qd. norvasc 579mqd no new side effects.  Home readings: rarely - normal at pharmacy.  BP Readings from Last 3 Encounters:  01/22/21 124/68  08/22/20 129/89  05/30/20 130/86   Lab Results  Component Value Date   CREATININE 0.99 04/11/2020   Hyperlipidemia: zocor 2069md. No new myalgias/side effects.  Fasting today.  Exercising most days.  Avoiding soda/sweet tea/fast food.  Lab Results  Component Value Date   CHOL 134 04/11/2020   HDL 43 04/11/2020   LDLCALC 71 04/11/2020   TRIG 108 04/11/2020   CHOLHDL 3.1 04/11/2020   Lab Results  Component Value Date   ALT 26 04/11/2020   AST 20 04/11/2020   ALKPHOS 85 04/11/2020   BILITOT 0.3 04/11/2020       History Patient Active Problem List   Diagnosis Date Noted  . Erectile dysfunction due to arterial insufficiency 08/07/2015  . Varicocele 08/07/2015  . [place]  . HTN (hypertension) 07/31/2012   Past Medical History:  Diagnosis Date  . [place] . GERD (gastroesophageal reflux disease)   . Hyperlipidemia   . Hypertension    Past Surgical History:  Procedure Laterality Date  . COLONOSCOPY     Allergies  Allergen Reactions  . Ace Inhibitors Itching   Prior to Admission medications   Medication Sig Start Date End Date Taking? Authorizing Provider  ACCU-CHEK AVIVA PLUS test strip USE TO CHECK BLOOD SUGAR LEVELS 2 TIMES DAILY 11/01/20  Yes GreWendie AgresteD  amLODipine (NORVASC) 5 MG tablet TAKE 1 TABLET(5 MG) BY MOUTH DAILY 01/20/21  Yes GreWendie AgresteD  Blood Glucose Monitoring Suppl KIT One Touch Ultra or may substitute for what insurance covers.  May check once per day 09/09/14  Yes GreWendie AgresteD  Dulaglutide (TRULICITY) 4.5 MG/WI/0.9BDPN Inject 4.5 mg as directed once a week. 09/05/20  Yes GreWendie AgresteD  glimepiride (AMARYL) 4 MG tablet TAKE 2 TABLETS(8 MG) BY MOUTH DAILY WITH BREAKFAST 11/22/20  Yes GreWendie AgresteD  losartan-hydrochlorothiazide ([facility]-25 MG tablet TAKE 1 TABLET BY MOUTH DAILY 01/20/21  Yes GreWendie AgresteD  metFORMIN (GLUCOPHAGE) 1000 MG tablet Take 1 tablet (1,000 mg total) by mouth 2 (two) times daily with a meal. 05/30/20  Yes GreWendie AgresteD  omeprazole (PRILOSEC) 20 MG capsule  TAKE 1 CAPSULE(20 MG) BY MOUTH DAILY 05/30/20  Yes [name], MD  simvastatin (ZOCOR) 20 MG tablet TAKE 1 TABLET(20 MG) BY MOUTH AT BEDTIME 11/23/20  Yes [name], MD   Social History   Socioeconomic History  . Marital status: Married    Spouse name: Not on file  . Number of children: 3  . Years of education: Not on file  . Highest education level: Not on file  Occupational History  . Occupation: Airline pilot: [employer]  Tobacco Use  . Smoking status: Never Smoker  . Smokeless tobacco: Never Used  Vaping Use  . Vaping Use:  Never used  Substance and Sexual Activity  . Alcohol use: No  . Drug use: No  . Sexual activity: Yes  Other Topics Concern  . Not on file  Social History Narrative   Married. Education: [employer]. Exercise: Walks twice a week for 1-2 hours.   Social Determinants of Health   Financial Resource Strain: Not on file  Food Insecurity: Not on file  Transportation Needs: Not on file  Physical Activity: Not on file  Stress: Not on file  Social Connections: Not on file  Intimate Partner Violence: Not on file    Review of Systems  Constitutional: Negative for fatigue and unexpected weight change.  Eyes: Negative for visual disturbance.  Respiratory: Negative for cough, chest tightness and shortness of breath.   Cardiovascular: Negative for chest pain, palpitations and leg swelling.  Gastrointestinal: Negative for abdominal pain and blood in stool.  Neurological: Negative for dizziness, light-headedness and headaches.     Objective:   Vitals:   01/22/21 0825  BP: 124/68  Pulse: 85  Resp: 16  Temp: 98 F (36.7 C)  TempSrc: Temporal  SpO2: 97%  Weight: 200 lb 6.4 oz (90.9 kg)  Height: _0  (1.753 m)     Physical Exam Vitals reviewed.  Constitutional:      Appearance: He is well-developed.  HENT:     Head: Normocephalic and atraumatic.  Eyes:     Pupils: Pupils are equal, round, and reactive to light.  Neck:     Vascular: No carotid bruit or JVD.  Cardiovascular:     Rate and Rhythm: Normal rate and regular rhythm.     Heart sounds: Normal heart sounds. No murmur heard.   Pulmonary:     Effort: Pulmonary effort is normal.     Breath sounds: Normal breath sounds. No rales.  Skin:    General: Skin is warm and dry.  Neurological:     Mental Status: He is alert and oriented to person, place, and time.        Assessment & Plan:  Wayne Perez is a 60 y.o. male . Type 2 diabetes mellitus with hyperglycemia, without long-term current use of insulin (HCC) - Plan:  Hemoglobin A1c Diabetes mellitus with microalbuminuria (HCC) - Plan: Hemoglobin A1c  -Uncontrolled/decreased control last visit, still elevated readings in the morning.  Microalbuminuria noted from last visit, he is on ARB but will need to make sure we work on diabetes control.  Check A1c.  Continue same regimen for now including higher dose of Trulicity from last visit.  Meds refilled.  Essential hypertension - Plan: Comprehensive metabolic panel  -Stable on current regimen, continue same  Hyperlipidemia, unspecified hyperlipidemia type - Plan: Comprehensive metabolic panel, Lipid panel  -Tolerating current regimen, continue simvastatin.  Check labs.  Health maintenance Has ophthalmology exam pending soon.  Did recommend COVID-19  vaccine booster.  No orders of the defined types were placed in this encounter.  Patient Instructions  No med changes for now. I do recommend Covid 19 vaccine booster. Follow up in 3 months, but let me know if there are questions sooner.   Thanks for coming in today and take care.       Signed, [name], MD [facility]

## 2021-04-24 ENCOUNTER — Ambulatory Visit: Payer: Managed Care, Other (non HMO) | Admitting: Family Medicine

## 2021-06-04 ENCOUNTER — Other Ambulatory Visit: Payer: Self-pay

## 2021-06-04 ENCOUNTER — Ambulatory Visit: Payer: Managed Care, Other (non HMO) | Admitting: Family Medicine

## 2021-06-04 ENCOUNTER — Encounter: Payer: Self-pay | Admitting: Family Medicine

## 2021-06-04 VITALS — BP 124/88 | HR 68 | Temp 97.6°F | Ht 69.0 in | Wt 187.0 lb

## 2021-06-04 DIAGNOSIS — I10 Essential (primary) hypertension: Secondary | ICD-10-CM

## 2021-06-04 DIAGNOSIS — E1165 Type 2 diabetes mellitus with hyperglycemia: Secondary | ICD-10-CM

## 2021-06-04 DIAGNOSIS — E785 Hyperlipidemia, unspecified: Secondary | ICD-10-CM

## 2021-06-04 DIAGNOSIS — K219 Gastro-esophageal reflux disease without esophagitis: Secondary | ICD-10-CM

## 2021-06-04 LAB — HEMOGLOBIN A1C: Hgb A1c MFr Bld: 7 % — ABNORMAL HIGH (ref 4.6–6.5)

## 2021-06-04 MED ORDER — AMLODIPINE BESYLATE 5 MG PO TABS: ORAL_TABLET | ORAL | 2 refills | Status: DC

## 2021-06-04 MED ORDER — LOSARTAN POTASSIUM-HCTZ 100-25 MG PO TABS: 1.0000 | ORAL_TABLET | Freq: Every day | ORAL | 2 refills | Status: DC

## 2021-06-04 MED ORDER — OMEPRAZOLE 20 MG PO CPDR: DELAYED_RELEASE_CAPSULE | ORAL | 2 refills | Status: DC

## 2021-06-04 MED ORDER — TRULICITY 4.5 MG/0.5ML ~~LOC~~ SOAJ: 4.5000 mg | SUBCUTANEOUS | 2 refills | Status: DC

## 2021-06-04 MED ORDER — SIMVASTATIN 20 MG PO TABS: ORAL_TABLET | ORAL | 2 refills | Status: DC

## 2021-06-04 MED ORDER — GLIMEPIRIDE 4 MG PO TABS: ORAL_TABLET | ORAL | 2 refills | Status: DC

## 2021-06-04 MED ORDER — METFORMIN HCL 1000 MG PO TABS: 1000.0000 mg | ORAL_TABLET | Freq: Two times a day (BID) | ORAL | 2 refills | Status: DC

## 2021-06-04 NOTE — Patient Instructions (Addendum)
If any return of low blood sugars, be seen right away. Make sure not to skip meals.  I expect the diabetes test to look better with weight loss, keep up the good work.  We may need to decrease the dose of glimepiride if the levels are lower.  I will let you know once I see your labs. Return to the clinic or go to the nearest emergency room if any of your symptoms worsen or new symptoms occur.  Type 2 Diabetes Mellitus, Self-Care, Adult Caring for yourself after you have been diagnosed with type 2 diabetes (type 2 diabetes mellitus) means keeping your blood sugar (glucose) under control with a balance of: Nutrition. Exercise. Lifestyle changes. Medicines or insulin, if needed. Support from your team of health care providers and others. The following information explains what you need to know to manage your diabetes at home. What are the risks? Having diabetes can put you at risk for other long-term (chronic) conditions, such as heart disease and kidney disease. Your health care provider may prescribe medicines to help prevent complications from diabetes. How to monitor blood glucose  Check your blood glucose every day, or as often as told by your health care provider. Have your A1C (hemoglobin A1C) level checked two or more times a year, or as often as told by your health care provider. Your health care provider will set personalized treatment goals for you. Generally, the goal of treatment is to maintain the following blood glucose levels: Before meals: 80-130 mg/dL (4.4-7.2 mmol/L). After meals: below 180 mg/dL (10 mmol/L). A1C level: less than 7%. How to manage hyperglycemia and hypoglycemia Hyperglycemia symptoms Hyperglycemia, also called high blood glucose, occurs when blood glucose is too high. Make sure you know the early signs of hyperglycemia, such as: Increased thirst. Hunger. Feeling very tired. Needing to urinate more often than usual. Blurry vision. Hypoglycemia  symptoms Hypoglycemia, also called low blood glucose, occurs with a blood glucose level at or below 70 mg/dL (3.9 mmol/L). Diabetes medicines lower your blood glucose and can cause hypoglycemia. The risk for hypoglycemia increases during or after exercise, during sleep, during illness, and when skipping meals or not eating for a long time (fasting). It is important to know the symptoms of hypoglycemia and treat it right away. Always have a 15-gram rapid-acting carbohydrate snack with you to treat low blood glucose. Family members and close friends should also know the symptoms and understand how to treat hypoglycemia, in case you are not able to treat yourself. Symptoms may include: Hunger. Anxiety. Sweating and feeling clammy. Dizziness or feeling light-headed. Sleepiness. Increased heart rate. Irritability. Tingling or numbness around the mouth, lips, or tongue. Restless sleep. Severe hypoglycemia is when your blood glucose level is at or below 54 mg/dL (3 mmol/L). Severe hypoglycemia is an emergency. Do not wait to see if the symptoms will go away. Get medical help right away. Call your local emergency services (911 in the [place] Do not drive yourself to the hospital. If you have severe hypoglycemia and you cannot eat or drink, you may need glucagon. A family member or close friend should learn how to check your blood glucose and how to give you glucagon. Ask your health care provider if you need to have an emergency glucagon kit available. Follow these instructions at home: Medicines Take diabetes medicines as told by your health care provider. If your health care provider prescribed insulin or diabetes medicines, take them every day. Do not run out of insulin or other  diabetes medicines. Plan ahead so you always have these available. If you use insulin, adjust your dosage based on your physical activity and what foods you eat. Your health care provider will tell you how to adjust your  dosage. Take over-the-counter and prescription medicines only as told by your health care provider. Eating and drinking What you eat and drink affects your blood glucose and your insulin dosage. Making good choices helps to control your diabetes and prevent other health problems. A healthy meal plan includes eating lean proteins, complex carbohydrates, fresh fruits and vegetables, low-fat dairy products, and healthy fats. Make an appointment to see a registered dietitian to help you create an eating plan that is right for you. Make sure that you: Follow instructions from your health care provider about eating or drinking restrictions. Drink enough fluid to keep your urine pale yellow. Keep a record of the carbohydrates that you eat. Do this by reading food labels and learning the standard serving sizes of foods. Follow your sick-day plan whenever you cannot eat or drink as usual. Make this plan in advance with your health care provider.  Activity Stay active. Exercise regularly, as told by your health care provider. This may include: Stretching and doing strength exercises, such as yoga or weight lifting, 2 or more times a week. Doing 150 minutes or more of moderate-intensity or vigorous-intensity exercise each week. This could be brisk walking, biking, or water aerobics. Spread out your activity over 3 or more days of the week. Do not go more than 2 days in a row without doing some kind of physical activity. When you start a new exercise or activity, work with your health care provider to adjust your insulin, medicines, or food intake as needed. Lifestyle Do not use any products that contain nicotine or tobacco, such as cigarettes, e-cigarettes, and chewing tobacco. If you need help quitting, ask your health care provider. If your health care provider says that alcohol is safe for you, limit how much you use to no more than 1 drink a day for women who are not pregnant and 2 drinks a day for men.  In the [place] one drink equals one 12 oz bottle of beer (355 mL), one 5 oz glass of wine (148 mL), or one 1 oz glass of hard liquor (44 mL). Learn to manage stress. If you need help with this, ask your health care provider. Take care of your body  Keep your immunizations up to date. In addition to getting vaccinations as told by your health care provider, it is recommended that you get vaccinated against the following illnesses: The flu (influenza). Get a flu shot every year. Pneumonia. Hepatitis B. Schedule an eye exam soon after your diagnosis, and then one time every year after that. Check your skin and feet every day for cuts, bruises, redness, blisters, or sores. Schedule a foot exam with your health care provider once every year. Brush your teeth and gums two times a day, and floss one or more times a day. Visit your dentist one or more times every 6 months. Maintain a healthy weight. General instructions Share your diabetes management plan with people in your workplace, school, and household. Carry a medical alert card or wear medical alert jewelry. Keep all follow-up visits as told by your health care provider. This is important. Questions to ask your health care provider Should I meet with a certified diabetes care and education specialist? Where can I find a support group for people with  diabetes? Where to find more information American Diabetes Association (ADA): www.diabetes.org American Association of Diabetes Care and Education Specialists (ADCES): www.diabeteseducator.org International Diabetes Federation (IDF): MemberVerification.ca Summary Caring for yourself after you have been diagnosed with type 2 diabetes (type 2 diabetes mellitus) means keeping your blood sugar (glucose) under control with a balance of nutrition, exercise, lifestyle changes, and medicine. Check your blood glucose every day, as often as told by your health care provider. Having diabetes can put you at risk for  other long-term (chronic) conditions, such as heart disease and kidney disease. Your health care provider may prescribe medicines to help prevent complications from diabetes. Share your diabetes management plan with people in your workplace, school, and household. Keep all follow-up visits as told by your health care provider. This is important. This information is not intended to replace advice given to you by your health care provider. Make sure you discuss any questions you have with your health care provider. Document Revised: 08/06/2020 Document Reviewed: 03/26/2020 [employer] Patient Education  [place].

## 2021-06-04 NOTE — Progress Notes (Signed)
Subjective:  Patient ID: Wayne Perez, male    DOB: [birth year]  Age: 60 y.o. MRN: [id number]  CC:  Chief Complaint  Patient presents with   Diabetes    HPI Wayne Perez presents for   Hyperlipidemia: Zocor 20 mg daily. No new myalgias/side effects.  Lab Results  Component Value Date   CHOL 140 01/22/2021   HDL 47.30 01/22/2021   LDLCALC 67 01/22/2021   TRIG 130.0 01/22/2021   CHOLHDL 3 01/22/2021   Lab Results  Component Value Date   ALT 34 01/22/2021   AST 29 01/22/2021   ALKPHOS 60 01/22/2021   BILITOT 0.4 01/22/2021      Hypertension: Losartan HCT 100/25 mg daily, Norvasc 5 mg daily.no new side effects.  Home readings: 120/80 BP Readings from Last 3 Encounters:  06/04/21 124/88  01/22/21 124/68  08/22/20 129/89   Lab Results  Component Value Date   CREATININE 0.98 01/22/2021    Diabetes: Complicated by hyperglycemia, microalbuminuria.  He is on statin, ARB, metformin 1000 mg twice daily, glimepiride 8 mg daily, and Trulicity 4.5 mg. Home readings: Fasting: 100-150. No 200's, one low of 60's in past month - middle of night. No missed meal. No recurrence.  2hr PP - not checking.  Microalbumin: ratio 60 in 08/2020  Has lost weight with exercise.  Wt Readings from Last 3 Encounters:  06/04/21 187 lb (84.8 kg)  01/22/21 200 lb 6.4 oz (90.9 kg)  08/22/20 203 lb (92.1 kg)     Lab Results  Component Value Date   HGBA1C 7.5 (H) 01/22/2021   HGBA1C 7.6 (H) 08/22/2020   HGBA1C 7.7 (H) 04/11/2020   Lab Results  Component Value Date   MICROALBUR 5.7 (H) 03/03/2015   LDLCALC 67 01/22/2021   CREATININE 0.98 01/22/2021     History Patient Active Problem List   Diagnosis Date Noted   Erectile dysfunction due to arterial insufficiency 08/07/2015   Varicocele 08/07/2015   Diabetes ([place]) 07/31/2012   HTN (hypertension) 07/31/2012   Past Medical History:  Diagnosis Date   Diabetes mellitus without complication (HCC)    GERD (gastroesophageal  reflux disease)    Hyperlipidemia    Hypertension    Past Surgical History:  Procedure Laterality Date   COLONOSCOPY     Allergies  Allergen Reactions   Ace Inhibitors Itching   Prior to Admission medications   Medication Sig Start Date End Date Taking? Authorizing Provider  ACCU-CHEK AVIVA PLUS test strip USE TO CHECK BLOOD SUGAR LEVELS 2 TIMES DAILY 11/01/20  Yes [name], MD  amLODipine (NORVASC) 5 MG tablet TAKE 1 TABLET(5 MG) BY MOUTH DAILY 01/22/21  Yes [name], MD  Blood Glucose Monitoring Suppl KIT One Touch Ultra or may substitute for what insurance covers.  May check once per day 09/09/14  Yes [name], MD  Dulaglutide (TRULICITY) 4.5 QJ/1.9ER SOPN Inject 4.5 mg as directed once a week. 01/22/21  Yes [name], MD  glimepiride (AMARYL) 4 MG tablet TAKE 2 TABLETS(8 MG) BY MOUTH DAILY WITH BREAKFAST 01/22/21  Yes [name], MD  losartan-hydrochlorothiazide (HYZAAR) 100-25 MG tablet Take 1 tablet by mouth daily. 01/22/21  Yes [name], MD  metFORMIN (GLUCOPHAGE) 1000 MG tablet Take 1 tablet (1,000 mg total) by mouth 2 (two) times daily with a meal. 01/22/21  Yes [name], MD  omeprazole (PRILOSEC) 20 MG capsule TAKE 1 CAPSULE(20 MG) BY MOUTH DAILY 01/22/21  Yes [name], MD  simvastatin (  ZOCOR) 20 MG tablet TAKE 1 TABLET(20 MG) BY MOUTH AT BEDTIME 01/22/21  Yes [name], MD   Social History   Socioeconomic History   Marital status: Married    Spouse name: Not on file   Number of children: 3   Years of education: Not on file   Highest education level: Not on file  Occupational History   Occupation: Cook    Employer: [employer]  Tobacco Use   Smoking status: Never   Smokeless tobacco: Never  Vaping Use   Vaping Use: Never used  Substance and Sexual Activity   Alcohol use: No   Drug use: No   Sexual activity: Yes  Other Topics Concern   Not on file  Social History Narrative   Married. Education:  [employer]. Exercise: Walks twice a week for 1-2 hours.   Social Determinants of Health   Financial Resource Strain: Not on file  Food Insecurity: Not on file  Transportation Needs: Not on file  Physical Activity: Not on file  Stress: Not on file  Social Connections: Not on file  Intimate Partner Violence: Not on file    Review of Systems  Constitutional:  Negative for fatigue and unexpected weight change.  Eyes:  Negative for visual disturbance.  Respiratory:  Negative for cough, chest tightness and shortness of breath.   Cardiovascular:  Negative for chest pain, palpitations and leg swelling.  Gastrointestinal:  Negative for abdominal pain and blood in stool.  Neurological:  Negative for dizziness, light-headedness and headaches.    Objective:   Vitals:   06/04/21 1038  BP: 124/88  Pulse: 68  Temp: 97.6 F (36.4 C)  SpO2: 97%  Weight: 187 lb (84.8 kg)  Height: 5' 9"  (1.753 m)     Physical Exam Vitals reviewed.  Constitutional:      Appearance: He is well-developed.  HENT:     Head: Normocephalic and atraumatic.  Neck:     Vascular: No carotid bruit or JVD.  Cardiovascular:     Rate and Rhythm: Normal rate and regular rhythm.     Heart sounds: Normal heart sounds. No murmur heard. Pulmonary:     Effort: Pulmonary effort is normal.     Breath sounds: Normal breath sounds. No rales.  Musculoskeletal:     Right lower leg: No edema.     Left lower leg: No edema.  Skin:    General: Skin is warm and dry.  Neurological:     Mental Status: He is alert and oriented to person, place, and time.  Psychiatric:        Mood and Affect: Mood normal.       Assessment & Plan:  Wayne Perez is a 60 y.o. male . Hyperlipidemia, unspecified hyperlipidemia type  -  Stable, tolerating current regimen. No changes in stain for now, labs in 3 months.   Type 2 diabetes mellitus with hyperglycemia, without long-term current use of insulin (HCC) - Plan: metFORMIN (GLUCOPHAGE) 1000  MG tablet, glimepiride (AMARYL) 4 MG tablet, Dulaglutide (TRULICITY) 4.58 YO/0.58YO SOPN, amLODipine (NORVASC) 5 MG tablet, losartan-hydrochlorothiazide (HYZAAR) 100-25 MG tablet, simvastatin (ZOCOR) 20 MG tablet, Hemoglobin A1c  -Tolerating current regimen, check A1C.  RTC precautions if any return of hypoglycemia.  Avoid skipping meals.  No med changes for now.  Consider decreased dose or stopping sulfonylurea if recurrence of hypoglycemia.  Essential hypertension - Plan: losartan-hydrochlorothiazide (HYZAAR) 100-25 MG tablet  -Stable, continue losartan HCTZ same dose for now.  Gastroesophageal reflux disease, unspecified whether esophagitis  present - Plan: omeprazole (PRILOSEC) 20 MG capsule  -Continue PPI.  Meds ordered this encounter  Medications   metFORMIN (GLUCOPHAGE) 1000 MG tablet    Sig: Take 1 tablet (1,000 mg total) by mouth 2 (two) times daily with a meal.    Dispense:  180 tablet    Refill:  2   glimepiride (AMARYL) 4 MG tablet    Sig: TAKE 2 TABLETS(8 MG) BY MOUTH DAILY WITH BREAKFAST    Dispense:  180 tablet    Refill:  2   Dulaglutide (TRULICITY) 4.5 KT/6.2BW SOPN    Sig: Inject 4.5 mg as directed once a week.    Dispense:  6 mL    Refill:  2   amLODipine (NORVASC) 5 MG tablet    Sig: TAKE 1 TABLET(5 MG) BY MOUTH DAILY    Dispense:  90 tablet    Refill:  2   losartan-hydrochlorothiazide (HYZAAR) 100-25 MG tablet    Sig: Take 1 tablet by mouth daily.    Dispense:  90 tablet    Refill:  2   omeprazole (PRILOSEC) 20 MG capsule    Sig: TAKE 1 CAPSULE(20 MG) BY MOUTH DAILY    Dispense:  90 capsule    Refill:  2   simvastatin (ZOCOR) 20 MG tablet    Sig: TAKE 1 TABLET(20 MG) BY MOUTH AT BEDTIME    Dispense:  90 tablet    Refill:  2   Patient Instructions  If any return of low blood sugars, be seen right away. Make sure not to skip meals.  I expect the diabetes test to look better with weight loss, keep up the good work.  We may need to decrease the dose of  glimepiride if the levels are lower.  I will let you know once I see your labs. Return to the clinic or go to the nearest emergency room if any of your symptoms worsen or new symptoms occur.  Type 2 Diabetes Mellitus, Self-Care, Adult Caring for yourself after you have been diagnosed with type 2 diabetes (type 2 diabetes mellitus) means keeping your blood sugar (glucose) under control with a balance of: Nutrition. Exercise. Lifestyle changes. Medicines or insulin, if needed. Support from your team of health care providers and others. The following information explains what you need to know to manage your diabetes at home. What are the risks? Having diabetes can put you at risk for other long-term (chronic) conditions, such as heart disease and kidney disease. Your health care provider may prescribe medicines to help prevent complications from diabetes. How to monitor blood glucose  Check your blood glucose every day, or as often as told by your health care provider. Have your A1C (hemoglobin A1C) level checked two or more times a year, or as often as told by your health care provider. Your health care provider will set personalized treatment goals for you. Generally, the goal of treatment is to maintain the following blood glucose levels: Before meals: 80-130 mg/dL (4.4-7.2 mmol/L). After meals: below 180 mg/dL (10 mmol/L). A1C level: less than 7%. How to manage hyperglycemia and hypoglycemia Hyperglycemia symptoms Hyperglycemia, also called high blood glucose, occurs when blood glucose is too high. Make sure you know the early signs of hyperglycemia, such as: Increased thirst. Hunger. Feeling very tired. Needing to urinate more often than usual. Blurry vision. Hypoglycemia symptoms Hypoglycemia, also called low blood glucose, occurs with a blood glucose level at or below 70 mg/dL (3.9 mmol/L). Diabetes medicines lower your blood glucose and  can cause hypoglycemia. The risk for  hypoglycemia increases during or after exercise, during sleep, during illness, and when skipping meals or not eating for a long time (fasting). It is important to know the symptoms of hypoglycemia and treat it right away. Always have a 15-gram rapid-acting carbohydrate snack with you to treat low blood glucose. Family members and close friends should also know the symptoms and understand how to treat hypoglycemia, in case you are not able to treat yourself. Symptoms may include: Hunger. Anxiety. Sweating and feeling clammy. Dizziness or feeling light-headed. Sleepiness. Increased heart rate. Irritability. Tingling or numbness around the mouth, lips, or tongue. Restless sleep. Severe hypoglycemia is when your blood glucose level is at or below 54 mg/dL (3 mmol/L). Severe hypoglycemia is an emergency. Do not wait to see if the symptoms will go away. Get medical help right away. Call your local emergency services (911 in the [place] Do not drive yourself to the hospital. If you have severe hypoglycemia and you cannot eat or drink, you may need glucagon. A family member or close friend should learn how to check your blood glucose and how to give you glucagon. Ask your health care provider if you need to have an emergency glucagon kit available. Follow these instructions at home: Medicines Take diabetes medicines as told by your health care provider. If your health care provider prescribed insulin or diabetes medicines, take them every day. Do not run out of insulin or other diabetes medicines. Plan ahead so you always have these available. If you use insulin, adjust your dosage based on your physical activity and what foods you eat. Your health care provider will tell you how to adjust your dosage. Take over-the-counter and prescription medicines only as told by your health care provider. Eating and drinking What you eat and drink affects your blood glucose and your insulin dosage. Making good choices  helps to control your diabetes and prevent other health problems. A healthy meal plan includes eating lean proteins, complex carbohydrates, fresh fruits and vegetables, low-fat dairy products, and healthy fats. Make an appointment to see a registered dietitian to help you create an eating plan that is right for you. Make sure that you: Follow instructions from your health care provider about eating or drinking restrictions. Drink enough fluid to keep your urine pale yellow. Keep a record of the carbohydrates that you eat. Do this by reading food labels and learning the standard serving sizes of foods. Follow your sick-day plan whenever you cannot eat or drink as usual. Make this plan in advance with your health care provider.  Activity Stay active. Exercise regularly, as told by your health care provider. This may include: Stretching and doing strength exercises, such as yoga or weight lifting, 2 or more times a week. Doing 150 minutes or more of moderate-intensity or vigorous-intensity exercise each week. This could be brisk walking, biking, or water aerobics. Spread out your activity over 3 or more days of the week. Do not go more than 2 days in a row without doing some kind of physical activity. When you start a new exercise or activity, work with your health care provider to adjust your insulin, medicines, or food intake as needed. Lifestyle Do not use any products that contain nicotine or tobacco, such as cigarettes, e-cigarettes, and chewing tobacco. If you need help quitting, ask your health care provider. If your health care provider says that alcohol is safe for you, limit how much you use to no more than  1 drink a day for women who are not pregnant and 2 drinks a day for men. In the [place] one drink equals one 12 oz bottle of beer (355 mL), one 5 oz glass of wine (148 mL), or one 1 oz glass of hard liquor (44 mL). Learn to manage stress. If you need help with this, ask your health care  provider. Take care of your body  Keep your immunizations up to date. In addition to getting vaccinations as told by your health care provider, it is recommended that you get vaccinated against the following illnesses: The flu (influenza). Get a flu shot every year. Pneumonia. Hepatitis B. Schedule an eye exam soon after your diagnosis, and then one time every year after that. Check your skin and feet every day for cuts, bruises, redness, blisters, or sores. Schedule a foot exam with your health care provider once every year. Brush your teeth and gums two times a day, and floss one or more times a day. Visit your dentist one or more times every 6 months. Maintain a healthy weight. General instructions Share your diabetes management plan with people in your workplace, school, and household. Carry a medical alert card or wear medical alert jewelry. Keep all follow-up visits as told by your health care provider. This is important. Questions to ask your health care provider Should I meet with a certified diabetes care and education specialist? Where can I find a support group for people with diabetes? Where to find more information American Diabetes Association (ADA): www.diabetes.org American Association of Diabetes Care and Education Specialists (ADCES): www.diabeteseducator.org International Diabetes Federation (IDF): MemberVerification.ca Summary Caring for yourself after you have been diagnosed with type 2 diabetes (type 2 diabetes mellitus) means keeping your blood sugar (glucose) under control with a balance of nutrition, exercise, lifestyle changes, and medicine. Check your blood glucose every day, as often as told by your health care provider. Having diabetes can put you at risk for other long-term (chronic) conditions, such as heart disease and kidney disease. Your health care provider may prescribe medicines to help prevent complications from diabetes. Share your diabetes management plan with  people in your workplace, school, and household. Keep all follow-up visits as told by your health care provider. This is important. This information is not intended to replace advice given to you by your health care provider. Make sure you discuss any questions you have with your health care provider. Document Revised: 08/06/2020 Document Reviewed: 03/26/2020 Elsevier Patient Education  2022 [place],   [name], MD [facility] 06/04/21 12:34 PM

## 2021-08-27 ENCOUNTER — Telehealth: Payer: Self-pay

## 2021-08-27 DIAGNOSIS — E1165 Type 2 diabetes mellitus with hyperglycemia: Secondary | ICD-10-CM

## 2021-08-27 NOTE — Telephone Encounter (Signed)
Caller name:[facility] [address]   On DPR? :No   Call back number:[phone]  Provider they see: Carlota Raspberry  Reason for call:Dulaglutide (TRULICITY) 4.5 IT/9.4FX SOPN  Is out of stock is there anything else can be sent in

## 2021-08-28 MED ORDER — SEMAGLUTIDE (1 MG/DOSE) 4 MG/3ML ~~LOC~~ SOPN: 1.0000 mg | PEN_INJECTOR | SUBCUTANEOUS | 0 refills | Status: DC

## 2021-08-28 NOTE — Telephone Encounter (Signed)
Patient is having trouble getting Trulicity and wanted to know if there is an alternative for her to take.

## 2021-08-28 NOTE — Telephone Encounter (Signed)
Available at other [facility] or pharmacy?

## 2021-08-28 NOTE — Telephone Encounter (Signed)
Noted. Will send ozempic temporarily until trulicity back in stock.

## 2021-08-28 NOTE — Telephone Encounter (Signed)
Called pharmacy, it is a nationwide back order, he even called some of the surrounding [facility].

## 2021-09-28 ENCOUNTER — Ambulatory Visit (INDEPENDENT_AMBULATORY_CARE_PROVIDER_SITE_OTHER): Payer: Managed Care, Other (non HMO) | Admitting: Family Medicine

## 2021-09-28 VITALS — BP 122/72 | HR 70 | Temp 98.2°F | Resp 17 | Ht 69.0 in | Wt 192.6 lb

## 2021-09-28 DIAGNOSIS — E785 Hyperlipidemia, unspecified: Secondary | ICD-10-CM

## 2021-09-28 DIAGNOSIS — E1165 Type 2 diabetes mellitus with hyperglycemia: Secondary | ICD-10-CM

## 2021-09-28 DIAGNOSIS — Z23 Encounter for immunization: Secondary | ICD-10-CM

## 2021-09-28 DIAGNOSIS — Z0000 Encounter for general adult medical examination without abnormal findings: Secondary | ICD-10-CM

## 2021-09-28 DIAGNOSIS — I10 Essential (primary) hypertension: Secondary | ICD-10-CM | POA: Diagnosis not present

## 2021-09-28 LAB — COMPREHENSIVE METABOLIC PANEL
ALT: 21 U/L (ref 0–53)
AST: 22 U/L (ref 0–37)
Albumin: 4.1 g/dL (ref 3.5–5.2)
Alkaline Phosphatase: 64 U/L (ref 39–117)
BUN: 11 mg/dL (ref 6–23)
CO2: 27 mEq/L (ref 19–32)
Calcium: 9.4 mg/dL (ref 8.4–10.5)
Chloride: 100 mEq/L (ref 96–112)
Creatinine, Ser: 1.07 mg/dL (ref 0.40–1.50)
GFR: 76.71 mL/min (ref >60.00)
Glucose, Bld: 96 mg/dL (ref 70–99)
Potassium: 4.4 mEq/L (ref 3.5–5.1)
Sodium: 135 mEq/L (ref 135–145)
Total Bilirubin: 0.6 mg/dL (ref 0.2–1.2)
Total Protein: 7.5 g/dL (ref 6.0–8.3)

## 2021-09-28 LAB — LIPID PANEL
Cholesterol: 126 mg/dL (ref 0–200)
HDL: 42.1 mg/dL (ref >39.00)
LDL Cholesterol: 58 mg/dL (ref 0–99)
NonHDL: 84.01
Total CHOL/HDL Ratio: 3
Triglycerides: 128 mg/dL (ref 0.0–149.0)
VLDL: 25.6 mg/dL (ref 0.0–40.0)

## 2021-09-28 LAB — MICROALBUMIN / CREATININE URINE RATIO
Creatinine,U: 110.6 mg/dL
Microalb Creat Ratio: 1.9 mg/g (ref 0.0–30.0)
Microalb, Ur: 2.2 mg/dL — ABNORMAL HIGH (ref 0.0–1.9)

## 2021-09-28 LAB — HEMOGLOBIN A1C: Hgb A1c MFr Bld: 7.2 % — ABNORMAL HIGH (ref 4.6–6.5)

## 2021-09-28 MED ORDER — LOSARTAN POTASSIUM-HCTZ 100-25 MG PO TABS: 1.0000 | ORAL_TABLET | Freq: Every day | ORAL | 2 refills | Status: DC

## 2021-09-28 MED ORDER — METFORMIN HCL 1000 MG PO TABS: 1000.0000 mg | ORAL_TABLET | Freq: Two times a day (BID) | ORAL | 2 refills | Status: DC

## 2021-09-28 MED ORDER — SIMVASTATIN 20 MG PO TABS: ORAL_TABLET | ORAL | 2 refills | Status: DC

## 2021-09-28 MED ORDER — SEMAGLUTIDE (1 MG/DOSE) 4 MG/3ML ~~LOC~~ SOPN: 1.0000 mg | PEN_INJECTOR | SUBCUTANEOUS | 0 refills | Status: DC

## 2021-09-28 MED ORDER — AMLODIPINE BESYLATE 5 MG PO TABS: ORAL_TABLET | ORAL | 2 refills | Status: DC

## 2021-09-28 MED ORDER — GLIMEPIRIDE 4 MG PO TABS: ORAL_TABLET | ORAL | 2 refills | Status: DC

## 2021-09-28 NOTE — Patient Instructions (Addendum)
Keep up the good work with exercise, diet.  I will check the labs.  If A1c is increasing can adjust the dose of medication.  If stable will continue on same meds and follow-up in 6 months.  Let me know if there are questions and take care!  Preventive Care 1-59 Years Old, Male Preventive care refers to lifestyle choices and visits with your health care provider that can promote health and wellness. Preventive care visits are also called wellness exams. What can I expect for my preventive care visit? Counseling During your preventive care visit, your health care provider may ask about your: Medical history, including: Past medical problems. Family medical history. Current health, including: Emotional well-being. Home life and relationship well-being. Sexual activity. Lifestyle, including: Alcohol, nicotine or tobacco, and drug use. Access to firearms. Diet, exercise, and sleep habits. Safety issues such as seatbelt and bike helmet use. Sunscreen use. Work and work Statistician. Physical exam Your health care provider will check your: Height and weight. These may be used to calculate your BMI (body mass index). BMI is a measurement that tells if you are at a healthy weight. Waist circumference. This measures the distance around your waistline. This measurement also tells if you are at a healthy weight and may help predict your risk of certain diseases, such as type 2 diabetes and high blood pressure. Heart rate and blood pressure. Body temperature. Skin for abnormal spots. What immunizations do I need? Vaccines are usually given at various ages, according to a schedule. Your health care provider will recommend vaccines for you based on your age, medical history, and lifestyle or other factors, such as travel or where you work. What tests do I need? Screening Your health care provider may recommend screening tests for certain conditions. This may include: Lipid and cholesterol  levels. Diabetes screening. This is done by checking your blood sugar (glucose) after you have not eaten for a while (fasting). Hepatitis B test. Hepatitis C test. HIV (human immunodeficiency virus) test. STI (sexually transmitted infection) testing, if you are at risk. Lung cancer screening. Prostate cancer screening. Colorectal cancer screening. Talk with your health care provider about your test results, treatment options, and if necessary, the need for more tests. Follow these instructions at home: Eating and drinking  Eat a diet that includes fresh fruits and vegetables, whole grains, lean protein, and low-fat dairy products. Take vitamin and mineral supplements as recommended by your health care provider. Do not drink alcohol if your health care provider tells you not to drink. If you drink alcohol: Limit how much you have to 0-2 drinks a day. Know how much alcohol is in your drink. In the [place] one drink equals one 12 oz bottle of beer (355 mL), one 5 oz glass of wine (148 mL), or one 1 oz glass of hard liquor (44 mL). Lifestyle Brush your teeth every morning and night with fluoride toothpaste. Floss one time each day. Exercise for at least 30 minutes 5 or more days each week. Do not use any products that contain nicotine or tobacco. These products include cigarettes, chewing tobacco, and vaping devices, such as e-cigarettes. If you need help quitting, ask your health care provider. Do not use drugs. If you are sexually active, practice safe sex. Use a condom or other form of protection to prevent STIs. Take aspirin only as told by your health care provider. Make sure that you understand how much to take and what form to take. Work with your health care  provider to find out whether it is safe and beneficial for you to take aspirin daily. Find healthy ways to manage stress, such as: Meditation, yoga, or listening to music. Journaling. Talking to a trusted person. Spending time  with friends and family. Minimize exposure to UV radiation to reduce your risk of skin cancer. Safety Always wear your seat belt while driving or riding in a vehicle. Do not drive: If you have been drinking alcohol. Do not ride with someone who has been drinking. When you are tired or distracted. While texting. If you have been using any mind-altering substances or drugs. Wear a helmet and other protective equipment during sports activities. If you have firearms in your house, make sure you follow all gun safety procedures. What's next? Go to your health care provider once a year for an annual wellness visit. Ask your health care provider how often you should have your eyes and teeth checked. Stay up to date on all vaccines. This information is not intended to replace advice given to you by your health care provider. Make sure you discuss any questions you have with your health care provider. Document Revised: 02/18/2021 Document Reviewed: 02/18/2021 Elsevier Patient Education  2022 [employer].    If you have lab work done today you will be contacted with your lab results within the next 2 weeks.  If you have not heard from [place] then please contact us. The fastest way to get your results is to register for My Chart.   IF you received an x-ray today, you will receive an invoice from [facility]. Please contact [facility] at [phone] with questions or concerns regarding your invoice.   IF you received labwork today, you will receive an invoice from [facility]. Please contact [facility] at [phone] with questions or concerns regarding your invoice.   Our billing staff will not be able to assist you with questions regarding bills from these companies.  You will be contacted with the lab results as soon as they are available. The fastest way to get your results is to activate your My Chart account. Instructions are located on the last page of this paperwork. If you  have not heard from [place] regarding the results in 2 weeks, please contact this office.

## 2021-09-28 NOTE — Progress Notes (Signed)
Subjective:  Patient ID: Wayne Perez, male    DOB: 1961/04/06  Age: 61 y.o. MRN: [id number]  CC:  Chief Complaint  Patient presents with   Annual Exam    Patient states he is here for a annual CPE. Patient has no concerns at this time.    HPI Wayne Perez presents for  Annual physical exam.  Hypertension: Losartan HCT 100/25 mg daily, Norvasc 5 mg daily.  No new side effects.  Home readings: rare - stable when checked.  BP Readings from Last 3 Encounters:  09/28/21 122/72  06/04/21 124/88  01/22/21 124/68   Lab Results  Component Value Date   CREATININE 0.98 01/22/2021    Hyperlipidemia: Zocor 20 mg daily.  No new myalgias. Lab Results  Component Value Date   CHOL 140 01/22/2021   HDL 47.30 01/22/2021   LDLCALC 67 01/22/2021   TRIG 130.0 01/22/2021   CHOLHDL 3 01/22/2021   Lab Results  Component Value Date   ALT 34 01/22/2021   AST 29 01/22/2021   ALKPHOS 60 01/22/2021   BILITOT 0.4 01/22/2021    Diabetes: Complicated by hyperglycemia, microalbuminuria.  Improved A1c is 7.0 in September with weight loss.  Treated with statin, ARB.  On metformin 1000 mg twice daily, glimepiride 8 mg daily, Trulicity 6.7HA weekly prior - recently changed to semaglutide 24m last month as shortage of trulicity.  Home readings: 130-150 No symptomatic lows.   Microalbumin: Ratio 60 in December 2021  Wt Readings from Last 3 Encounters:  09/28/21 192 lb 9.6 oz (87.4 kg)  06/04/21 187 lb (84.8 kg)  01/22/21 200 lb 6.4 oz (90.9 kg)  Weight was 197 last visit.   Lab Results  Component Value Date   HGBA1C 7.0 (H) 06/04/2021   HGBA1C 7.5 (H) 01/22/2021   HGBA1C 7.6 (H) 08/22/2020   Lab Results  Component Value Date   MICROALBUR 5.7 (H) 03/03/2015   LDLCALC 67 01/22/2021   CREATININE 0.98 01/22/2021    Cancer screening Colonoscopy 05/21/2014, Dr. JArdis Hughs 1 polyp.  Hyperplastic, repeat 10 years. Tre[facility]rology for prostate cancer[facility]  [facility]   Status post prostatectomy.  Erectile dysfunction after prostatectomy.  Burning with Trimix.  Other options were discussed with urology. No current treatment. Due for repeat eval - he will call for appt.   Immunization History  Administered Date(s) Administered   Hepatitis B 03/05/2013   Hepatitis B, adult 04/04/2013, 09/03/2013   Influenza Split 05/08/2011, 07/31/2012, 06/20/2013   Influenza,inj,Quad PF,6+ Mos 05/12/2015   Influenza-Unspecified 06/03/2014, 06/05/2016   PFIZER(Purple Top)SARS-COV-2 Vaccination 03/12/2020, 04/04/2020   Pneumococcal Polysaccharide-23 02/02/2017   Td 05/16/2009   Tdap 05/12/2015   Zoster Recombinat (Shingrix) 05/30/2020  Second shingles vaccine today.  COVID booster: last year. Unknown date.   No results found. Ophtho: last appt last year. Wears glasses. Will schedule appt this year.   Dental: last year, appt Q639moAlcohol:none  Tobacco: none  Exercise: 5 days per week - gym, cardio and weights - 4563m- 1hr.    History Patient Active Problem List   Diagnosis Date Noted   Erectile dysfunction due to arterial insufficiency 08/07/2015   Varicocele 08/07/2015   Diabetes (HCCHunt1/25/2013   HTN (hypertension) 07/31/2012   Past Medical History:  Diagnosis Date   Diabetes mellitus without complication (HCC[place]  GERD (gastroesophageal reflux disease)    Hyperlipidemia    Hypertension    Past Surgical History:  Procedure Laterality Date   COLONOSCOPY  Allergies  Allergen Reactions   Ace Inhibitors Itching   Prior to Admission medications   Medication Sig Start Date End Date Taking? Authorizing Provider  ACCU-CHEK AVIVA PLUS test strip USE TO CHECK BLOOD SUGAR LEVELS 2 TIMES DAILY 11/01/20  Yes [name], MD  amLODipine (NORVASC) 5 MG tablet TAKE 1 TABLET(5 MG) BY MOUTH DAILY 06/04/21  Yes [name], MD  Blood Glucose Monitoring Suppl KIT One Touch Ultra or may substitute for what insurance covers.  May check once per day  09/09/14  Yes [name], MD  glimepiride (AMARYL) 4 MG tablet TAKE 2 TABLETS(8 MG) BY MOUTH DAILY WITH BREAKFAST 06/04/21  Yes [name], MD  losartan-hydrochlorothiazide (HYZAAR) 100-25 MG tablet Take 1 tablet by mouth daily. 06/04/21  Yes [name], MD  metFORMIN (GLUCOPHAGE) 1000 MG tablet Take 1 tablet (1,000 mg total) by mouth 2 (two) times daily with a meal. 06/04/21  Yes [name], MD  omeprazole (PRILOSEC) 20 MG capsule TAKE 1 CAPSULE(20 MG) BY MOUTH DAILY 06/04/21  Yes [name], MD  Semaglutide, 1 MG/DOSE, 4 MG/3ML SOPN Inject 1 mg as directed once a week. 08/28/21  Yes [name], MD  simvastatin (ZOCOR) 20 MG tablet TAKE 1 TABLET(20 MG) BY MOUTH AT BEDTIME 06/04/21  Yes [name], MD   Social History   Socioeconomic History   Marital status: Married    Spouse name: Not on file   Number of children: 3   Years of education: Not on file   Highest education level: Not on file  Occupational History   Occupation: Cook    Employer: [employer]  Tobacco Use   Smoking status: Never   Smokeless tobacco: Never  Vaping Use   Vaping Use: Never used  Substance and Sexual Activity   Alcohol use: No   Drug use: No   Sexual activity: Yes  Other Topics Concern   Not on file  Social History Narrative   Married. Education: [employer]. Exercise: Walks twice a week for 1-2 hours.   Social Determinants of Health   Financial Resource Strain: Not on file  Food Insecurity: Not on file  Transportation Needs: Not on file  Physical Activity: Not on file  Stress: Not on file  Social Connections: Not on file  Intimate Partner Violence: Not on file    Review of Systems  13 point review of systems per patient health survey noted.  Negative other than as indicated above or in HPI.   Objective:   Vitals:   09/28/21 0814  BP: 122/72  Pulse: 70  Resp: 17  Temp: 98.2 F (36.8 C)  TempSrc: Temporal  SpO2: 99%  Weight: 192 lb 9.6 oz (87.4 kg)   Height: 5' 9"  (1.753 m)     Physical Exam Vitals reviewed.  Constitutional:      Appearance: He is well-developed.  HENT:     Head: Normocephalic and atraumatic.     Right Ear: External ear normal.     Left Ear: External ear normal.  Eyes:     Conjunctiva/sclera: Conjunctivae normal.     Pupils: Pupils are equal, round, and reactive to light.  Neck:     Thyroid: No thyromegaly.  Cardiovascular:     Rate and Rhythm: Normal rate and regular rhythm.     Heart sounds: Normal heart sounds.  Pulmonary:     Effort: Pulmonary effort is normal. No respiratory distress.     Breath sounds: Normal breath  sounds. No wheezing.  Abdominal:     General: There is no distension.     Palpations: Abdomen is soft.     Tenderness: There is no abdominal tenderness.  Musculoskeletal:        General: No tenderness. Normal range of motion.     Cervical back: Normal range of motion and neck supple.  Lymphadenopathy:     Cervical: No cervical adenopathy.  Skin:    General: Skin is warm and dry.  Neurological:     Mental Status: He is alert and oriented to person, place, and time.     Deep Tendon Reflexes: Reflexes are normal and symmetric.  Psychiatric:        Mood and Affect: Mood normal.        Behavior: Behavior normal.       Assessment & Plan:  Wayne Perez is a 61 y.o. male . Annual physical exam  - -anticipatory guidance as below in AVS, screening labs above. Health maintenance items as above in HPI discussed/recommended as applicable.   Type 2 diabetes mellitus with hyperglycemia, without long-term current use of insulin (HCC) - Plan: Hemoglobin A1c, Microalbumin / creatinine urine ratio, amLODipine (NORVASC) 5 MG tablet, glimepiride (AMARYL) 4 MG tablet, simvastatin (ZOCOR) 20 MG tablet, Semaglutide, 1 MG/DOSE, 4 MG/3ML SOPN, losartan-hydrochlorothiazide (HYZAAR) 100-25 MG tablet, metFORMIN (GLUCOPHAGE) 1000 MG tablet  -Prior improved control.  Check labs.  Continue exercise.  No med  changes for now.  May need to adjust dose of semaglutide.  47-monthfollow-up is stable.  3 months if elevated.  Essential hypertension - Plan: Comprehensive metabolic panel, losartan-hydrochlorothiazide (HYZAAR) 100-25 MG tablet  -Check labs, continue same dose Hyzaar.  Hyperlipidemia, unspecified hyperlipidemia type - Plan: Comprehensive metabolic panel, Lipid panel  -Tolerating statin, continue same.  Need for shingles vaccine - Plan: Varicella-zoster vaccine IM  Second dose given.  Meds ordered this encounter  Medications   amLODipine (NORVASC) 5 MG tablet    Sig: TAKE 1 TABLET(5 MG) BY MOUTH DAILY    Dispense:  90 tablet    Refill:  2   glimepiride (AMARYL) 4 MG tablet    Sig: TAKE 2 TABLETS(8 MG) BY MOUTH DAILY WITH BREAKFAST    Dispense:  180 tablet    Refill:  2   simvastatin (ZOCOR) 20 MG tablet    Sig: TAKE 1 TABLET(20 MG) BY MOUTH AT BEDTIME    Dispense:  90 tablet    Refill:  2   Semaglutide, 1 MG/DOSE, 4 MG/3ML SOPN    Sig: Inject 1 mg as directed once a week.    Dispense:  12 mL    Refill:  0   losartan-hydrochlorothiazide (HYZAAR) 100-25 MG tablet    Sig: Take 1 tablet by mouth daily.    Dispense:  90 tablet    Refill:  2   metFORMIN (GLUCOPHAGE) 1000 MG tablet    Sig: Take 1 tablet (1,000 mg total) by mouth 2 (two) times daily with a meal.    Dispense:  180 tablet    Refill:  2   Patient Instructions  Keep up the good work with exercise, diet.  I will check the labs.  If A1c is increasing can adjust the dose of medication.  If stable will continue on same meds and follow-up in 6 months.  Let me know if there are questions and take care!  Preventive Care [id number] Old, Male Preventive care refers to lifestyle choices and visits with your health care provider that can  promote health and wellness. Preventive care visits are also called wellness exams. What can I expect for my preventive care visit? Counseling During your preventive care visit, your health  care provider may ask about your: Medical history, including: Past medical problems. Family medical history. Current health, including: Emotional well-being. Home life and relationship well-being. Sexual activity. Lifestyle, including: Alcohol, nicotine or tobacco, and drug use. Access to firearms. Diet, exercise, and sleep habits. Safety issues such as seatbelt and bike helmet use. Sunscreen use. Work and work Statistician. Physical exam Your health care provider will check your: Height and weight. These may be used to calculate your BMI (body mass index). BMI is a measurement that tells if you are at a healthy weight. Waist circumference. This measures the distance around your waistline. This measurement also tells if you are at a healthy weight and may help predict your risk of certain diseases, such as type 2 diabetes and high blood pressure. Heart rate and blood pressure. Body temperature. Skin for abnormal spots. What immunizations do I need? Vaccines are usually given at various ages, according to a schedule. Your health care provider will recommend vaccines for you based on your age, medical history, and lifestyle or other factors, such as travel or where you work. What tests do I need? Screening Your health care provider may recommend screening tests for certain conditions. This may include: Lipid and cholesterol levels. Diabetes screening. This is done by checking your blood sugar (glucose) after you have not eaten for a while (fasting). Hepatitis B test. Hepatitis C test. HIV (human immunodeficiency virus) test. STI (sexually transmitted infection) testing, if you are at risk. Lung cancer screening. Prostate cancer screening. Colorectal cancer screening. Talk with your health care provider about your test results, treatment options, and if necessary, the need for more tests. Follow these instructions at home: Eating and drinking  Eat a diet that includes fresh fruits  and vegetables, whole grains, lean protein, and low-fat dairy products. Take vitamin and mineral supplements as recommended by your health care provider. Do not drink alcohol if your health care provider tells you not to drink. If you drink alcohol: Limit how much you have to 0-2 drinks a day. Know how much alcohol is in your drink. In the [place] one drink equals one 12 oz bottle of beer (355 mL), one 5 oz glass of wine (148 mL), or one 1 oz glass of hard liquor (44 mL). Lifestyle Brush your teeth every morning and night with fluoride toothpaste. Floss one time each day. Exercise for at least 30 minutes 5 or more days each week. Do not use any products that contain nicotine or tobacco. These products include cigarettes, chewing tobacco, and vaping devices, such as e-cigarettes. If you need help quitting, ask your health care provider. Do not use drugs. If you are sexually active, practice safe sex. Use a condom or other form of protection to prevent STIs. Take aspirin only as told by your health care provider. Make sure that you understand how much to take and what form to take. Work with your health care provider to find out whether it is safe and beneficial for you to take aspirin daily. Find healthy ways to manage stress, such as: Meditation, yoga, or listening to music. Journaling. Talking to a trusted person. Spending time with friends and family. Minimize exposure to UV radiation to reduce your risk of skin cancer. Safety Always wear your seat belt while driving or riding in a vehicle. Do not drive:  If you have been drinking alcohol. Do not ride with someone who has been drinking. When you are tired or distracted. While texting. If you have been using any mind-altering substances or drugs. Wear a helmet and other protective equipment during sports activities. If you have firearms in your house, make sure you follow all gun safety procedures. What's next? Go to your health care  provider once a year for an annual wellness visit. Ask your health care provider how often you should have your eyes and teeth checked. Stay up to date on all vaccines. This information is not intended to replace advice given to you by your health care provider. Make sure you discuss any questions you have with your health care provider. Document Revised: 02/18/2021 Document Reviewed: 02/18/2021 Elsevier Patient Education  2022 [employer].    If you have lab work done today you will be contacted with your lab results within the next 2 weeks.  If you have not heard from [place] then please contact us. The fastest way to get your results is to register for My Chart.   IF you received an x-ray today, you will receive an invoice from [facility]. Please contact [facility] at [phone] with questions or concerns regarding your invoice.   IF you received labwork today, you will receive an invoice from [facility]. Please contact [facility] at [phone] with questions or concerns regarding your invoice.   Our billing staff will not be able to assist you with questions regarding bills from these companies.  You will be contacted with the lab results as soon as they are available. The fastest way to get your results is to activate your My Chart account. Instructions are located on the last page of this paperwork. If you have not heard from [place] regarding the results in 2 weeks, please contact this office.        Signed,   [name], MD [place] 09/28/21 8:40 AM

## 2022-01-04 ENCOUNTER — Other Ambulatory Visit: Payer: Self-pay | Admitting: Family Medicine

## 2022-01-04 DIAGNOSIS — E1165 Type 2 diabetes mellitus with hyperglycemia: Secondary | ICD-10-CM

## 2022-02-27 ENCOUNTER — Other Ambulatory Visit: Payer: Self-pay | Admitting: Family Medicine

## 2022-02-27 DIAGNOSIS — E1165 Type 2 diabetes mellitus with hyperglycemia: Secondary | ICD-10-CM

## 2022-03-05 ENCOUNTER — Other Ambulatory Visit: Payer: Self-pay | Admitting: Family Medicine

## 2022-03-05 DIAGNOSIS — E1165 Type 2 diabetes mellitus with hyperglycemia: Secondary | ICD-10-CM

## 2022-04-02 ENCOUNTER — Encounter: Payer: Self-pay | Admitting: Family Medicine

## 2022-04-02 ENCOUNTER — Ambulatory Visit: Payer: Managed Care, Other (non HMO) | Admitting: Family Medicine

## 2022-04-02 VITALS — BP 130/85 | HR 82 | Temp 97.5°F | Resp 18 | Ht 69.0 in | Wt 191.6 lb

## 2022-04-02 DIAGNOSIS — K219 Gastro-esophageal reflux disease without esophagitis: Secondary | ICD-10-CM | POA: Diagnosis not present

## 2022-04-02 DIAGNOSIS — E785 Hyperlipidemia, unspecified: Secondary | ICD-10-CM

## 2022-04-02 DIAGNOSIS — E1165 Type 2 diabetes mellitus with hyperglycemia: Secondary | ICD-10-CM

## 2022-04-02 DIAGNOSIS — I10 Essential (primary) hypertension: Secondary | ICD-10-CM | POA: Diagnosis not present

## 2022-04-02 LAB — POCT GLYCOSYLATED HEMOGLOBIN (HGB A1C): Hemoglobin A1C: 6.7 % — AB (ref 4.0–5.6)

## 2022-04-02 MED ORDER — GLIMEPIRIDE 4 MG PO TABS: ORAL_TABLET | ORAL | 1 refills | Status: DC

## 2022-04-02 MED ORDER — OMEPRAZOLE 20 MG PO CPDR: DELAYED_RELEASE_CAPSULE | ORAL | 2 refills | Status: DC

## 2022-04-02 MED ORDER — AMLODIPINE BESYLATE 5 MG PO TABS: ORAL_TABLET | ORAL | 2 refills | Status: DC

## 2022-04-02 MED ORDER — GLIMEPIRIDE 2 MG PO TABS: 2.0000 mg | ORAL_TABLET | Freq: Every day | ORAL | 1 refills | Status: DC

## 2022-04-02 MED ORDER — SIMVASTATIN 20 MG PO TABS: ORAL_TABLET | ORAL | 2 refills | Status: DC

## 2022-04-02 MED ORDER — LOSARTAN POTASSIUM-HCTZ 100-25 MG PO TABS: 1.0000 | ORAL_TABLET | Freq: Every day | ORAL | 2 refills | Status: DC

## 2022-04-02 NOTE — Progress Notes (Signed)
Subjective:  Patient ID: Wayne Perez, male    DOB: 1960/12/17  Age: 61 y.o. MRN: [id number]  CC:  Chief Complaint  Patient presents with   Hyperlipidemia   Hypertension   Diabetes    HPI Wayne Perez presents for   Diabetes: With hyperglycemia, microalbuminuria.  On statin, ARB.  Metformin 1073m BID, glimepiride 867mqd, ozempic 48m73m week. (prior shortage of trulicty 4.52.8MK Home readings: Fasting: 119-120 Postprandial 160-170.  Microalbumin: 2.2 on 09/28/21. Symptomatic low - in past 2 months, after exercise - reading in the 80's. Felt dizzy.  Optho, foot exam, pneumovax:  Optho - last year. Appt in September.   Poct A1c 6.7 today.   Lab Results  Component Value Date   HGBA1C 6.7 (A) 04/02/2022   HGBA1C 7.2 (H) 09/28/2021   HGBA1C 7.0 (H) 06/04/2021   Lab Results  Component Value Date   MICROALBUR 2.2 (H) 09/28/2021   LDLCALC 58 09/28/2021   CREATININE 1.07 09/28/2021   Hypertension: Amlodipine 5mg63mosartan hct 100/25mg26m  Home readings: 130/90, 130/85,  BP Readings from Last 3 Encounters:  04/02/22 130/85  09/28/21 122/72  06/04/21 124/88   Lab Results  Component Value Date   CREATININE 1.07 09/28/2021   Hyperlipidemia: Simvastatin 20mg 62mno new myalgias/side effects. Fasting today.  Lab Results  Component Value Date   CHOL 126 09/28/2021   HDL 42.10 09/28/2021   LDLCALC 58 09/28/2021   TRIG 128.0 09/28/2021   CHOLHDL 3 09/28/2021   Lab Results  Component Value Date   ALT 21 09/28/2021   AST 22 09/28/2021   ALKPHOS 64 09/28/2021   BILITOT 0.6 09/28/2021       History Patient Active Problem List   Diagnosis Date Noted   Erectile dysfunction due to arterial insufficiency 08/07/2015   Varicocele 08/07/2015   Diabetes (HCC) 1[place]5/2013   HTN (hypertension) 07/31/2012   Past Medical History:  Diagnosis Date   Diabetes mellitus without complication (HCC)    GERD (gastroesophageal reflux disease)    Hyperlipidemia    Hypertension     Past Surgical History:  Procedure Laterality Date   COLONOSCOPY     Allergies  Allergen Reactions   Ace Inhibitors Itching   Prior to Admission medications   Medication Sig Start Date End Date Taking? Authorizing Provider  ACCU-CHEK AVIVA PLUS test strip USE TO CHECK BLOOD SUGAR LEVELS 2 TIMES DAILY 11/01/20  Yes GreeneWendie AgresteamLODipine (NORVASC) 5 MG tablet TAKE 1 TABLET(5 MG) BY MOUTH DAILY 09/28/21  Yes GreeneWendie AgresteBlood Glucose Monitoring Suppl KIT One Touch Ultra or may substitute for what insurance covers.  May check once per day 09/09/14  Yes GreeneWendie Agresteglimepiride (AMARYL) 4 MG tablet TAKE 2 TABLETS(8 MG) BY MOUTH DAILY WITH BREAKFAST 01/04/22  Yes GreeneWendie Agrestelosartan-hydrochlorothiazide (HYZAAR) 100-25 MG tablet Take 1 tablet by mouth daily. 09/28/21  Yes GreeneWendie AgrestemetFORMIN (GLUCOPHAGE) 1000 MG tablet TAKE 1 TABLET(1000 MG) BY MOUTH TWICE DAILY WITH A MEAL 03/05/22  Yes GreeneWendie Agresteomeprazole (PRILOSEC) 20 MG capsule TAKE 1 CAPSULE(20 MG) BY MOUTH DAILY 06/04/21  Yes GreeneWendie AgresteOZEMPIC, 1 MG/DOSE, 4 MG/3ML SOPN INJECT 1 MG INTO SKIN WEEKLY 03/01/22  Yes GreeneWendie Agrestesimvastatin (ZOCOR) 20 MG tablet TAKE 1 TABLET(20 MG) BY MOUTH AT BEDTIME 09/28/21  Yes GreeneWendie Agreste Social History   Socioeconomic  History   Marital status: Married    Spouse name: Not on file   Number of children: 3   Years of education: Not on file   Highest education level: Not on file  Occupational History   Occupation: Cook    Employer: [employer]  Tobacco Use   Smoking status: Never   Smokeless tobacco: Never  Vaping Use   Vaping Use: Never used  Substance and Sexual Activity   Alcohol use: No   Drug use: No   Sexual activity: Yes  Other Topics Concern   Not on file  Social History Narrative   Married. Education: [employer]. Exercise: Walks twice a week for 1-2 hours.   Social Determinants of Health    Financial Resource Strain: Not on file  Food Insecurity: Not on file  Transportation Needs: Not on file  Physical Activity: Not on file  Stress: Not on file  Social Connections: Not on file  Intimate Partner Violence: Not on file    Review of Systems  Constitutional:  Negative for fatigue and unexpected weight change.  Eyes:  Negative for visual disturbance.  Respiratory:  Negative for cough, chest tightness and shortness of breath.   Cardiovascular:  Negative for chest pain, palpitations and leg swelling.  Gastrointestinal:  Negative for abdominal pain and blood in stool.  Neurological:  Negative for dizziness, light-headedness and headaches.     Objective:   Vitals:   04/02/22 1043  BP: 130/85  Pulse: 82  Resp: 18  Temp: (!) 97.5 F (36.4 C)  SpO2: 97%  Weight: 191 lb 9.6 oz (86.9 kg)  Height: _0  (1.753 m)     Physical Exam Vitals reviewed.  Constitutional:      Appearance: He is well-developed.  HENT:     Head: Normocephalic and atraumatic.  Neck:     Vascular: No carotid bruit or JVD.  Cardiovascular:     Rate and Rhythm: Normal rate and regular rhythm.     Heart sounds: Normal heart sounds. No murmur heard. Pulmonary:     Effort: Pulmonary effort is normal.     Breath sounds: Normal breath sounds. No rales.  Musculoskeletal:     Right lower leg: No edema.     Left lower leg: No edema.  Skin:    General: Skin is warm and dry.  Neurological:     Mental Status: He is alert and oriented to person, place, and time.  Psychiatric:        Mood and Affect: Mood normal.        Assessment & Plan:  Wayne Perez is a 61 y.o. male . Type 2 diabetes mellitus with hyperglycemia, without long-term current use of insulin (HCC) - Plan: losartan-hydrochlorothiazide (HYZAAR) 100-25 MG tablet, amLODipine (NORVASC) 5 MG tablet, simvastatin (ZOCOR) 20 MG tablet, POCT glycosylated hemoglobin (Hb A1C), glimepiride (AMARYL) 4 MG tablet, glimepiride (AMARYL) 2 MG  tablet, CANCELED: Comprehensive metabolic panel  -N7V improved.  We will lower dose of glimepiride to 6 mg total per day with ultimate goal of tapering off glimepiride, can increase Ozempic with this taper.  For now we will continue Ozempic same dose with recheck levels in 3 months, advised patient to let me know if higher readings in the interim.Continue ARB for microalbuminuria.  Gastroesophageal reflux disease, unspecified whether esophagitis present - Plan: omeprazole (PRILOSEC) 20 MG capsule  -Continue Prilosec  Essential hypertension - Plan: losartan-hydrochlorothiazide (HYZAAR) 100-25 MG tablet, CANCELED: Comprehensive metabolic panel  -Borderline, option of higher dose of amlodipine if  higher home readings.  No change in losartan HCTZ dose.  Patient to let me know if he does need to make the BP med change.  Recheck 3 months.  Fasting labs at that time.  Meds ordered this encounter  Medications   omeprazole (PRILOSEC) 20 MG capsule    Sig: TAKE 1 CAPSULE(20 MG) BY MOUTH DAILY    Dispense:  90 capsule    Refill:  2   losartan-hydrochlorothiazide (HYZAAR) 100-25 MG tablet    Sig: Take 1 tablet by mouth daily.    Dispense:  90 tablet    Refill:  2   amLODipine (NORVASC) 5 MG tablet    Sig: TAKE 1 TABLET(5 MG) BY MOUTH DAILY    Dispense:  90 tablet    Refill:  2   simvastatin (ZOCOR) 20 MG tablet    Sig: TAKE 1 TABLET(20 MG) BY MOUTH AT BEDTIME    Dispense:  90 tablet    Refill:  2   glimepiride (AMARYL) 4 MG tablet    Sig: Change in dose - take 1 per day with 54m dose for total 664mper day.    Dispense:  90 tablet    Refill:  1   glimepiride (AMARYL) 2 MG tablet    Sig: Take 1 tablet (2 mg total) by mouth daily before breakfast.    Dispense:  90 tablet    Refill:  1   Patient Instructions  Decrease glimepiride to 29m88motal. No other changes for now. If A1c still doing well next visit, will plan to decrease glimepiride further and option to increase ozempic if needed. No  other changes for now. Ideally would like to get you off the glimepiride eventually.   If home blood pressures over 140 on top number or above 90 on lower number, then increase amlodipine to 2 pills per day. Let me know if you make that change.   Fasting labs next visit. Thanks for coming in today.        Signed,   JefMerri RayD LeBSirenumHoffmanoup 04/02/22 11:54 AM

## 2022-04-02 NOTE — Patient Instructions (Addendum)
Decrease glimepiride to '6mg'$  total. No other changes for now. If A1c still doing well next visit, will plan to decrease glimepiride further and option to increase ozempic if needed. No other changes for now. Ideally would like to get you off the glimepiride eventually.   If home blood pressures over 140 on top number or above 90 on lower number, then increase amlodipine to 2 pills per day. Let me know if you make that change.   Fasting labs next visit. Thanks for coming in today.

## 2022-07-09 ENCOUNTER — Ambulatory Visit: Payer: Managed Care, Other (non HMO) | Admitting: Family Medicine

## 2022-07-15 ENCOUNTER — Encounter: Payer: Self-pay | Admitting: Family Medicine

## 2022-07-15 ENCOUNTER — Ambulatory Visit (INDEPENDENT_AMBULATORY_CARE_PROVIDER_SITE_OTHER): Payer: Managed Care, Other (non HMO) | Admitting: Family Medicine

## 2022-07-15 VITALS — BP 110/88 | HR 74 | Temp 98.2°F | Ht 69.0 in | Wt 195.0 lb

## 2022-07-15 DIAGNOSIS — E785 Hyperlipidemia, unspecified: Secondary | ICD-10-CM | POA: Diagnosis not present

## 2022-07-15 DIAGNOSIS — E1165 Type 2 diabetes mellitus with hyperglycemia: Secondary | ICD-10-CM | POA: Diagnosis not present

## 2022-07-15 DIAGNOSIS — I10 Essential (primary) hypertension: Secondary | ICD-10-CM

## 2022-07-15 LAB — COMPREHENSIVE METABOLIC PANEL
ALT: 23 U/L (ref 0–53)
AST: 24 U/L (ref 0–37)
Albumin: 4.3 g/dL (ref 3.5–5.2)
Alkaline Phosphatase: 57 U/L (ref 39–117)
BUN: 13 mg/dL (ref 6–23)
CO2: 30 mEq/L (ref 19–32)
Calcium: 9.7 mg/dL (ref 8.4–10.5)
Chloride: 100 mEq/L (ref 96–112)
Creatinine, Ser: 1.04 mg/dL (ref 0.40–1.50)
GFR: 78.93 mL/min (ref >60.00)
Glucose, Bld: 84 mg/dL (ref 70–99)
Potassium: 4.1 mEq/L (ref 3.5–5.1)
Sodium: 135 mEq/L (ref 135–145)
Total Bilirubin: 0.5 mg/dL (ref 0.2–1.2)
Total Protein: 7.9 g/dL (ref 6.0–8.3)

## 2022-07-15 LAB — LIPID PANEL
Cholesterol: 142 mg/dL (ref 0–200)
HDL: 47.4 mg/dL (ref >39.00)
LDL Cholesterol: 70 mg/dL (ref 0–99)
NonHDL: 94.46
Total CHOL/HDL Ratio: 3
Triglycerides: 124 mg/dL (ref 0.0–149.0)
VLDL: 24.8 mg/dL (ref 0.0–40.0)

## 2022-07-15 LAB — HEMOGLOBIN A1C: Hgb A1c MFr Bld: 7.3 % — ABNORMAL HIGH (ref 4.6–6.5)

## 2022-07-15 MED ORDER — SEMAGLUTIDE (2 MG/DOSE) 8 MG/3ML ~~LOC~~ SOPN: 2.0000 mg | PEN_INJECTOR | SUBCUTANEOUS | 2 refills | Status: DC

## 2022-07-15 NOTE — Progress Notes (Signed)
Subjective:  Patient ID: Wayne Perez, male    DOB: May 13, 1961  Age: 61 y.o. MRN: [id number]  CC:  Chief Complaint  Patient presents with   Diabetes    Pt states all is well    HPI Wayne Perez presents for   Diabetes: With hyperglycemia, microalbuminuria.  He is on statin and ARB, metformin 1000 g twice daily, glimepiride 8 mg daily - decreased to 70m last visit d/t lower readings. Ozempic 188mweekly.  Previously on Trulicity then shortage, switch to Ozempic. No nausea or side effects with ozempic.   Home readings fasting: 120- 130 Postprandial:140-150 Symptomatic lows: none.   Microalbumin: 2.2 on 09/28/2021 Optho, foot exam, pneumovax: Up-to-date, except rescheduled optho for January.   Hypertension, hyperlipidemia discussed at last visit, labs today Fasting today.   Lab Results  Component Value Date   HGBA1C 6.7 (A) 04/02/2022   HGBA1C 7.2 (H) 09/28/2021   HGBA1C 7.0 (H) 06/04/2021   Lab Results  Component Value Date   MICROALBUR 2.2 (H) 09/28/2021   LDLCALC 58 09/28/2021   CREATININE 1.07 09/28/2021    History Patient Active Problem List   Diagnosis Date Noted   Type 2 diabetes mellitus without complication, without long-term current use of insulin ([facility]01/10/2018   Erectile dysfunction due to arterial insufficiency 08/07/2015   Diabetes ([facility]/25/2013   HTN (hypertension) 07/31/2012   Past Medical History:  Diagnosis Date   Diabetes mellitus without complication (HCC)    GERD (gastroesophageal reflux disease)    Hyperlipidemia    Hypertension    Past Surgical History:  Procedure Laterality Date   COLONOSCOPY     Allergies  Allergen Reactions   Ace Inhibitors Itching   Prior to Admission medications   Medication Sig Start Date End Date Taking? Authorizing Provider  ACCU-CHEK AVIVA PLUS test strip USE TO CHECK BLOOD SUGAR LEVELS 2 TIMES DAILY 11/01/20  Yes GrWendie AgresteMD  amLODipine (NORVASC) 5 MG tablet TAKE 1 TABLET(5 MG) BY MOUTH DAILY  04/02/22  Yes GrWendie AgresteMD  Blood Glucose Monitoring Suppl KIT One Touch Ultra or may substitute for what insurance covers.  May check once per day 09/09/14  Yes GrWendie AgresteMD  glimepiride (AMARYL) 2 MG tablet Take 1 tablet (2 mg total) by mouth daily before breakfast. 04/02/22  Yes GrWendie AgresteMD  glimepiride (AMARYL) 2 MG tablet Take by mouth. 04/20/18  Yes [provider]  glimepiride (AMARYL) 4 MG tablet Change in dose - take 1 per day with 1m83mose for total 6mg9mr day. 04/02/22  Yes GreeWendie Agreste  losartan-hydrochlorothiazide (HYZAAR) 100-25 MG tablet Take 1 tablet by mouth daily. 04/02/22  Yes GreeWendie Agreste  losartan-hydrochlorothiazide (HYZAAR) 50-12.5 MG tablet Take 2 tablets by mouth daily. 06/25/18  Yes [provider]  metFORMIN (GLUCOPHAGE) 1000 MG tablet TAKE 1 TABLET(1000 MG) BY MOUTH TWICE DAILY WITH A MEAL 03/05/22  Yes GreeWendie Agreste  metFORMIN (GLUCOPHAGE) 1000 MG tablet Take by mouth. 04/20/18  Yes [provider]  omeprazole (PRILOSEC) 20 MG capsule TAKE 1 CAPSULE(20 MG) BY MOUTH DAILY 04/02/22  Yes GreeWendie Agreste  OZEMPIC, 1 MG/DOSE, 4 MG/3ML SOPN INJECT 1 MG INTO SKIN WEEKLY 03/01/22  Yes GreeWendie Agreste  simvastatin (ZOCOR) 20 MG tablet TAKE 1 TABLET(20 MG) BY MOUTH AT BEDTIME 04/02/22  Yes GreeWendie Agreste   Social History   Socioeconomic History   Marital status: Married  Spouse name: Not on file   Number of children: 3   Years of education: Not on file   Highest education level: Not on file  Occupational History   Occupation: Cook    Employer: [employer]  Tobacco Use   Smoking status: Never   Smokeless tobacco: Never  Vaping Use   Vaping Use: Never used  Substance and Sexual Activity   Alcohol use: No   Drug use: No   Sexual activity: Yes  Other Topics Concern   Not on file  Social History Narrative   Married. Education: [employer]. Exercise: Walks twice a week for 1-2 hours.    Social Determinants of Health   Financial Resource Strain: Not on file  Food Insecurity: Not on file  Transportation Needs: Not on file  Physical Activity: Not on file  Stress: Not on file  Social Connections: Not on file  Intimate Partner Violence: Not on file    Review of Systems  Constitutional:  Negative for fatigue and unexpected weight change.  Eyes:  Negative for visual disturbance.  Respiratory:  Negative for cough, chest tightness and shortness of breath.   Cardiovascular:  Negative for chest pain, palpitations and leg swelling.  Gastrointestinal:  Negative for abdominal pain and blood in stool.  Neurological:  Negative for dizziness, light-headedness and headaches.     Objective:   Vitals:   07/15/22 1103  BP: 110/88  Pulse: 74  Temp: 98.2 F (36.8 C)  SpO2: 99%  Weight: 195 lb (88.5 kg)  Height: 5' 9" (1.753 m)     Physical Exam Vitals reviewed.  Constitutional:      Appearance: He is well-developed.  HENT:     Head: Normocephalic and atraumatic.  Neck:     Vascular: No carotid bruit or JVD.  Cardiovascular:     Rate and Rhythm: Normal rate and regular rhythm.     Heart sounds: Normal heart sounds. No murmur heard. Pulmonary:     Effort: Pulmonary effort is normal.     Breath sounds: Normal breath sounds. No rales.  Musculoskeletal:     Right lower leg: No edema.     Left lower leg: No edema.  Skin:    General: Skin is warm and dry.  Neurological:     Mental Status: He is alert and oriented to person, place, and time.  Psychiatric:        Mood and Affect: Mood normal.        Assessment & Plan:  Wayne Perez is a 61 y.o. male . Essential hypertension - Plan: Comprehensive metabolic panel  Type 2 diabetes mellitus with hyperglycemia, without long-term current use of insulin (HCC) - Plan: Semaglutide, 2 MG/DOSE, 8 MG/3ML SOPN, Hemoglobin A1c  Hyperlipidemia, unspecified hyperlipidemia type - Plan: Lipid panel  Check labs for  hypertension, hyperlipidemia, tolerating current regimen.  No changes.  For diabetes, has tolerated Ozempic.  We will try higher dose and stop sulfonylurea.  Monitor readings with RTC precautions.  Labs as above, 92-monthfollow-up.  Meds ordered this encounter  Medications   Semaglutide, 2 MG/DOSE, 8 MG/3ML SOPN    Sig: Inject 2 mg as directed once a week.    Dispense:  6 mL    Refill:  2   Patient Instructions  Stop glimepiride, increase dose of Ozempic to 2 mg/week.  No other changes for now.  I will check labs and let you know if there are concerns.  Follow-up in 3 months.     Signed,  [name], MD [place] 07/15/22 1:04 PM

## 2022-07-15 NOTE — Patient Instructions (Signed)
Stop glimepiride, increase dose of Ozempic to 2 mg/week.  No other changes for now.  I will check labs and let you know if there are concerns.  Follow-up in 3 months.

## 2022-07-16 ENCOUNTER — Telehealth: Payer: Self-pay | Admitting: Family Medicine

## 2022-07-16 NOTE — Telephone Encounter (Signed)
$'2mg'x$  dose was ordered. That may be easier to find than starting doses. Recommend checking with other pharmacies including [facility]. Ok to send rx to any available pharmacy.

## 2022-07-16 NOTE — Telephone Encounter (Signed)
Patient called back and was informed.

## 2022-07-16 NOTE — Telephone Encounter (Signed)
Called and LM to inform the patient and if he has trouble asked he call back

## 2022-07-16 NOTE — Telephone Encounter (Signed)
[facility] called and stated that the Ozempic is on back order and patient wanted to know if she could go back to Trulicity. I let the pharmacy know I would ask the provider and get back with them.

## 2022-07-20 ENCOUNTER — Telehealth: Payer: Self-pay | Admitting: Family Medicine

## 2022-07-20 NOTE — Telephone Encounter (Signed)
Caller name: Dicky Boer  On DPR?: Yes  Call back number: [phone] (mobile)  Provider they see: [name], MD  Reason for call:  pt called stating that his pharmacy is out of stock on his his medication Semaglutide 2 mg/dose, 8 mg/3ML. Pt states that his pharmacy called around to other [facility] and they are out of stock as well. Pt needs help finding this medication.

## 2022-07-20 NOTE — Telephone Encounter (Signed)
Called him and informed it would be the best he calls around and asks who has this in stock for the other pharmacies in his area notes he will call around and give [place] a call back

## 2022-08-11 ENCOUNTER — Other Ambulatory Visit (HOSPITAL_COMMUNITY): Payer: Self-pay

## 2022-08-11 ENCOUNTER — Telehealth: Payer: Self-pay

## 2022-08-11 NOTE — Telephone Encounter (Signed)
Pt called to inform us needs PA on ozempic, provided  [phone] to call per the pharmacy  Pt notes needs his ID number [id number]

## 2022-08-12 ENCOUNTER — Other Ambulatory Visit (HOSPITAL_COMMUNITY): Payer: Self-pay

## 2022-08-12 NOTE — Telephone Encounter (Signed)
Pharmacy Patient Advocate Encounter  Insurance verification completed.    The patient is insured through [employer] for: Ozempic ('2MG'$ /DOSE) '8MG'$ /3ML.  Refill too soon. Last filled 07/22/22 at [facility], next fill 08/14/22

## 2022-08-12 NOTE — Telephone Encounter (Signed)
With current shortage of 2 mg, will insurance cover 1 mg, 2 injections/week?  If not may need to switch to other GLP-1 temporarily.  Let me know. Thanks.

## 2022-08-12 NOTE — Telephone Encounter (Signed)
With there not being any stock how should patient proceed

## 2022-08-12 NOTE — Telephone Encounter (Signed)
Called the pharmacy for clarification bc patient wasn't able to pick up this med.  According to pharmacy this med is on back order no in need of an auth there is a shortage of 2 mg now, as well as 2 days early   [facility] medication How should we proceed

## 2022-08-13 NOTE — Telephone Encounter (Signed)
Left pt a VM to return my call  

## 2022-08-16 ENCOUNTER — Other Ambulatory Visit: Payer: Self-pay

## 2022-08-16 DIAGNOSIS — E1165 Type 2 diabetes mellitus with hyperglycemia: Secondary | ICD-10-CM

## 2022-08-16 MED ORDER — SEMAGLUTIDE (2 MG/DOSE) 8 MG/3ML ~~LOC~~ SOPN: 2.0000 mg | PEN_INJECTOR | SUBCUTANEOUS | 2 refills | Status: DC

## 2022-08-16 NOTE — Telephone Encounter (Signed)
Per Cecelia Byars, patient called back and was informed this has been sent and will attempt to pick up after work at 5

## 2022-08-16 NOTE — Telephone Encounter (Signed)
Patient returned call to [place] and I let him know what Marcille Blanco was calling him about.   Has there been anything done with this? Doesn't look like the '1mg'$  was sent in to see if insurance would cover it. Maybe we should try and call other pharmacies and see if they have the '2mg'$  in stock. Please let me know what we're doing to move this along, patient is OUT of the medication

## 2022-08-16 NOTE — Telephone Encounter (Signed)
Called [facility] [place] [facility] [address] [facility] only have 1 box at this time so do need an answer as soon as possible or may run out of stock here as well this is the only box within 20 miles

## 2022-08-16 NOTE — Telephone Encounter (Signed)
[facility] called [facility] and [facility] [place], sent medication but did warn him there is only one box and it may be gone by the time he is off at 5

## 2022-08-16 NOTE — Telephone Encounter (Signed)
[facility] on [address] [facility] [place] [facility] 1 box of the 2 mg rx if we can get it filled fast enough

## 2022-08-16 NOTE — Telephone Encounter (Signed)
Called pharmacy pt normally goes to both the '2mg'$  and 1 mg are on back order with no return date and no other GLP 1 available notes almost all GLP1 are on backorder and she advised starting a different rx if we are concerned about the patient waiting

## 2022-09-11 ENCOUNTER — Other Ambulatory Visit: Payer: Self-pay | Admitting: Family Medicine

## 2022-09-11 DIAGNOSIS — E1165 Type 2 diabetes mellitus with hyperglycemia: Secondary | ICD-10-CM

## 2022-09-24 ENCOUNTER — Other Ambulatory Visit: Payer: Self-pay | Admitting: Family Medicine

## 2022-09-24 DIAGNOSIS — E1165 Type 2 diabetes mellitus with hyperglycemia: Secondary | ICD-10-CM

## 2022-10-14 ENCOUNTER — Ambulatory Visit: Payer: Managed Care, Other (non HMO) | Admitting: Family Medicine

## 2022-10-22 ENCOUNTER — Ambulatory Visit: Payer: Managed Care, Other (non HMO) | Admitting: Family Medicine

## 2022-10-25 ENCOUNTER — Ambulatory Visit: Payer: Managed Care, Other (non HMO) | Admitting: Family Medicine

## 2022-10-25 VITALS — BP 118/60 | HR 96 | Temp 98.7°F | Ht 69.0 in | Wt 186.2 lb

## 2022-10-25 DIAGNOSIS — E785 Hyperlipidemia, unspecified: Secondary | ICD-10-CM | POA: Diagnosis not present

## 2022-10-25 DIAGNOSIS — K219 Gastro-esophageal reflux disease without esophagitis: Secondary | ICD-10-CM

## 2022-10-25 DIAGNOSIS — E1165 Type 2 diabetes mellitus with hyperglycemia: Secondary | ICD-10-CM

## 2022-10-25 DIAGNOSIS — I10 Essential (primary) hypertension: Secondary | ICD-10-CM | POA: Diagnosis not present

## 2022-10-25 MED ORDER — LOSARTAN POTASSIUM-HCTZ 100-25 MG PO TABS: 1.0000 | ORAL_TABLET | Freq: Every day | ORAL | 2 refills | Status: DC

## 2022-10-25 MED ORDER — OMEPRAZOLE 20 MG PO CPDR: DELAYED_RELEASE_CAPSULE | ORAL | 2 refills | Status: DC

## 2022-10-25 MED ORDER — METFORMIN HCL 1000 MG PO TABS: ORAL_TABLET | ORAL | 2 refills | Status: DC

## 2022-10-25 MED ORDER — SEMAGLUTIDE (2 MG/DOSE) 8 MG/3ML ~~LOC~~ SOPN: 2.0000 mg | PEN_INJECTOR | SUBCUTANEOUS | 2 refills | Status: DC

## 2022-10-25 MED ORDER — AMLODIPINE BESYLATE 5 MG PO TABS: ORAL_TABLET | ORAL | 2 refills | Status: DC

## 2022-10-25 MED ORDER — SIMVASTATIN 20 MG PO TABS: ORAL_TABLET | ORAL | 2 refills | Status: DC

## 2022-10-25 NOTE — Patient Instructions (Signed)
Glad to hear things are going well.  No med changes today.  If any concerns on labs I will let you know.  Take care!

## 2022-10-25 NOTE — Progress Notes (Signed)
Subjective:  Patient ID: Caulin Dalomba, male    DOB: [birth year]  Age: 62 y.o. MRN: [id number]  CC:  Chief Complaint  Patient presents with   Hyperlipidemia   Hypertension   Gastroesophageal Reflux   Diabetes    HPI Demitrious Gean presents for   Hypertension: Amlodipine 5 mg daily, losartan HCTZ 100/25 mg daily. Home readings: 130/80 BP Readings from Last 3 Encounters:  10/25/22 118/60  07/15/22 110/88  04/02/22 130/85   Lab Results  Component Value Date   CREATININE 1.04 07/15/2022    Hyperlipidemia: Simvastatin 20 mg daily, no new myalgias.  Lab Results  Component Value Date   CHOL 142 07/15/2022   HDL 47.40 07/15/2022   LDLCALC 70 07/15/2022   TRIG 124.0 07/15/2022   CHOLHDL 3 07/15/2022   Lab Results  Component Value Date   ALT 23 07/15/2022   AST 24 07/15/2022   ALKPHOS 57 07/15/2022   BILITOT 0.5 07/15/2022   Diabetes:  With hyperglycemia, microalbuminuria. A1c elevated In November. Treated with metformin 1000 mg twice daily, Ozempic 2 mg/week.  Dosage was increased at his November visit and glimepiride was discontinued. Home readings:  Fasting: 120-125 Postprandial:none No sx lows. No n/v/abd pain.   Exercising.  Microalbumin: 2.2 in 09/2021. On ARB and statin.  Optho, foot exam, pneumovax:  Optho in next 2 months, otherwise UTD.   Lab Results  Component Value Date   HGBA1C 7.3 (H) 07/15/2022   HGBA1C 6.7 (A) 04/02/2022   HGBA1C 7.2 (H) 09/28/2021   Lab Results  Component Value Date   MICROALBUR 2.2 (H) 09/28/2021   LDLCALC 70 07/15/2022   CREATININE 1.04 07/15/2022    History Patient Active Problem List   Diagnosis Date Noted   Type 2 diabetes mellitus without complication, without long-term current use of insulin (HCC) 09/07/2018   Erectile dysfunction due to arterial insufficiency 08/07/2015   Diabetes (HCC) 07/31/2012   HTN (hypertension) 07/31/2012   Past Medical History:  Diagnosis Date   Diabetes mellitus without complication  (HCC)    GERD (gastroesophageal reflux disease)    Hyperlipidemia    Hypertension    Past Surgical History:  Procedure Laterality Date   COLONOSCOPY     Allergies  Allergen Reactions   Ace Inhibitors Itching   Prior to Admission medications   Medication Sig Start Date End Date Taking? Authorizing Provider  ACCU-CHEK AVIVA PLUS test strip USE TO CHECK BLOOD SUGAR LEVELS TWICE DAILY 09/13/22  Yes Shade Flood, MD  amLODipine (NORVASC) 5 MG tablet TAKE 1 TABLET(5 MG) BY MOUTH DAILY 04/02/22  Yes Shade Flood, MD  Blood Glucose Monitoring Suppl KIT One Touch Ultra or may substitute for what insurance covers.  May check once per day 09/09/14  Yes Shade Flood, MD  losartan-hydrochlorothiazide (HYZAAR) 100-25 MG tablet Take 1 tablet by mouth daily. 04/02/22  Yes Shade Flood, MD  metFORMIN (GLUCOPHAGE) 1000 MG tablet TAKE 1 TABLET(1000 MG) BY MOUTH TWICE DAILY WITH A MEAL 03/05/22  Yes Shade Flood, MD  Semaglutide, 2 MG/DOSE, 8 MG/3ML SOPN Inject 2 mg as directed once a week. 08/16/22  Yes Shade Flood, MD  simvastatin (ZOCOR) 20 MG tablet TAKE 1 TABLET(20 MG) BY MOUTH AT BEDTIME 04/02/22  Yes Shade Flood, MD   Social History   Socioeconomic History   Marital status: Married    Spouse name: Not on file   Number of children: 3   Years of education: Not on file   Highest education  level: Not on file  Occupational History   Occupation: Multimedia programmer: [employer]  Tobacco Use   Smoking status: Never   Smokeless tobacco: Never  Vaping Use   Vaping Use: Never used  Substance and Sexual Activity   Alcohol use: No   Drug use: No   Sexual activity: Yes  Other Topics Concern   Not on file  Social History Narrative   Married. Education: [employer]. Exercise: Walks twice a week for 1-2 hours.   Social Determinants of Health   Financial Resource Strain: Not on file  Food Insecurity: Not on file  Transportation Needs: Not on file  Physical Activity: Not on  file  Stress: Not on file  Social Connections: Not on file  Intimate Partner Violence: Not on file    Review of Systems  Constitutional:  Negative for fatigue and unexpected weight change.  Eyes:  Negative for visual disturbance.  Respiratory:  Negative for cough, chest tightness and shortness of breath.   Cardiovascular:  Negative for chest pain, palpitations and leg swelling.  Gastrointestinal:  Negative for abdominal pain and blood in stool.  Neurological:  Negative for dizziness, light-headedness and headaches.     Objective:   Vitals:   10/25/22 1355  BP: 118/60  Pulse: 96  Temp: 98.7 F (37.1 C)  TempSrc: Temporal  SpO2: 97%  Weight: 186 lb 3.2 oz (84.5 kg)  Height: 5\' 9"  (1.753 m)     Physical Exam Vitals reviewed.  Constitutional:      Appearance: He is well-developed.  HENT:     Head: Normocephalic and atraumatic.  Neck:     Vascular: No carotid bruit or JVD.  Cardiovascular:     Rate and Rhythm: Normal rate and regular rhythm.     Heart sounds: Normal heart sounds. No murmur heard. Pulmonary:     Effort: Pulmonary effort is normal.     Breath sounds: Normal breath sounds. No rales.  Musculoskeletal:     Right lower leg: No edema.     Left lower leg: No edema.  Skin:    General: Skin is warm and dry.  Neurological:     Mental Status: He is alert and oriented to person, place, and time.  Psychiatric:        Mood and Affect: Mood normal.        Assessment & Plan:  Durron Heagney is a 62 y.o. male . Hyperlipidemia, unspecified hyperlipidemia type - Plan: Lipid panel  -Stable, tolerating current dose Zocor, continue same with labs pending.  Adjustments accordingly  Essential hypertension - Plan: losartan-hydrochlorothiazide (HYZAAR) 100-25 MG tablet, Comprehensive metabolic panel  -Stable control on losartan HCTZ, amlodipine.  Continue same dosages, labs pending  Type 2 diabetes mellitus with hyperglycemia, without long-term current use of insulin  (HCC) - Plan: losartan-hydrochlorothiazide (HYZAAR) 100-25 MG tablet, amLODipine (NORVASC) 5 MG tablet, simvastatin (ZOCOR) 20 MG tablet, Urine Microalbumin w/creat. ratio, Hemoglobin A1c, metFORMIN (GLUCOPHAGE) 1000 MG tablet, Semaglutide, 2 MG/DOSE, 8 MG/3ML SOPN  -Tolerating current dose of Ozempic and metformin.  No symptomatic lows, doing well off of sulfonylurea.  Continue same, check labs above.  Gastroesophageal reflux disease, unspecified whether esophagitis present - Plan: omeprazole (PRILOSEC) 20 MG capsule  -Stable with omeprazole, continue same.  Meds ordered this encounter  Medications   losartan-hydrochlorothiazide (HYZAAR) 100-25 MG tablet    Sig: Take 1 tablet by mouth daily.    Dispense:  90 tablet    Refill:  2   omeprazole (PRILOSEC) 20  MG capsule    Sig: TAKE 1 CAPSULE(20 MG) BY MOUTH DAILY    Dispense:  90 capsule    Refill:  2   amLODipine (NORVASC) 5 MG tablet    Sig: TAKE 1 TABLET(5 MG) BY MOUTH DAILY    Dispense:  90 tablet    Refill:  2   simvastatin (ZOCOR) 20 MG tablet    Sig: TAKE 1 TABLET(20 MG) BY MOUTH AT BEDTIME    Dispense:  90 tablet    Refill:  2   metFORMIN (GLUCOPHAGE) 1000 MG tablet    Sig: TAKE 1 TABLET(1000 MG) BY MOUTH TWICE DAILY WITH A MEAL    Dispense:  180 tablet    Refill:  2   Semaglutide, 2 MG/DOSE, 8 MG/3ML SOPN    Sig: Inject 2 mg as directed once a week.    Dispense:  6 mL    Refill:  2   Patient Instructions  Glad to hear things are going well.  No med changes today.  If any concerns on labs I will let you know.  Take care!    Signed,   Meredith Staggers, MD [facility], [facility] 10/25/22 2:12 PM

## 2022-10-26 ENCOUNTER — Encounter: Payer: Self-pay | Admitting: Family Medicine

## 2022-10-26 LAB — MICROALBUMIN / CREATININE URINE RATIO
Creatinine,U: 83.8 mg/dL
Microalb Creat Ratio: 2.2 mg/g (ref 0.0–30.0)
Microalb, Ur: 1.8 mg/dL (ref 0.0–1.9)

## 2022-10-26 LAB — COMPREHENSIVE METABOLIC PANEL
ALT: 24 U/L (ref 0–53)
AST: 23 U/L (ref 0–37)
Albumin: 3.9 g/dL (ref 3.5–5.2)
Alkaline Phosphatase: 65 U/L (ref 39–117)
BUN: 16 mg/dL (ref 6–23)
CO2: 30 mEq/L (ref 19–32)
Calcium: 10.2 mg/dL (ref 8.4–10.5)
Chloride: 100 mEq/L (ref 96–112)
Creatinine, Ser: 1.09 mg/dL (ref 0.40–1.50)
GFR: 74.45 mL/min (ref >60.00)
Glucose, Bld: 104 mg/dL — ABNORMAL HIGH (ref 70–99)
Potassium: 3.9 mEq/L (ref 3.5–5.1)
Sodium: 138 mEq/L (ref 135–145)
Total Bilirubin: 0.4 mg/dL (ref 0.2–1.2)
Total Protein: 8 g/dL (ref 6.0–8.3)

## 2022-10-26 LAB — LIPID PANEL
Cholesterol: 124 mg/dL (ref 0–200)
HDL: 45.3 mg/dL (ref >39.00)
LDL Cholesterol: 53 mg/dL (ref 0–99)
NonHDL: 78.35
Total CHOL/HDL Ratio: 3
Triglycerides: 128 mg/dL (ref 0.0–149.0)
VLDL: 25.6 mg/dL (ref 0.0–40.0)

## 2022-10-26 LAB — HEMOGLOBIN A1C: Hgb A1c MFr Bld: 7.2 % — ABNORMAL HIGH (ref 4.6–6.5)

## 2023-01-24 ENCOUNTER — Encounter: Payer: Self-pay | Admitting: Family Medicine

## 2023-01-24 ENCOUNTER — Ambulatory Visit (INDEPENDENT_AMBULATORY_CARE_PROVIDER_SITE_OTHER): Payer: Managed Care, Other (non HMO) | Admitting: Family Medicine

## 2023-01-24 VITALS — BP 102/74 | HR 78 | Temp 98.0°F | Ht 69.0 in | Wt 182.6 lb

## 2023-01-24 DIAGNOSIS — Z7985 Long-term (current) use of injectable non-insulin antidiabetic drugs: Secondary | ICD-10-CM | POA: Diagnosis not present

## 2023-01-24 DIAGNOSIS — E1165 Type 2 diabetes mellitus with hyperglycemia: Secondary | ICD-10-CM

## 2023-01-24 DIAGNOSIS — I10 Essential (primary) hypertension: Secondary | ICD-10-CM

## 2023-01-24 DIAGNOSIS — K219 Gastro-esophageal reflux disease without esophagitis: Secondary | ICD-10-CM

## 2023-01-24 DIAGNOSIS — E785 Hyperlipidemia, unspecified: Secondary | ICD-10-CM

## 2023-01-24 LAB — POCT GLYCOSYLATED HEMOGLOBIN (HGB A1C): Hemoglobin A1C: 6.8 % — AB (ref 4.0–5.6)

## 2023-01-24 NOTE — Progress Notes (Unsigned)
Subjective:  Patient ID: Wayne Perez, male    DOB: Jan 06, 1961  Age: 62 y.o. MRN: [id number]  CC:  Chief Complaint  Patient presents with   Medical Management of Chronic Issues    HPI Wayne Perez presents for   Diabetes: With hyperglycemia, microalbuminuria.  Borderline A1c in February.  Metformin 1000 mg twice daily, Ozempic 2 mg/week.  Glimepiride had been discontinued previously. He is on ARB as well as statin. No new side effects.  No new side effects with meds.  Home readings 120-160  No lows, no 200's.  Microalbumin: 10/25/22 - normal.  Optho, foot exam, pneumovax: Up-to-date - optho appt in July.   Lab Results  Component Value Date   HGBA1C 6.8 (A) 01/24/2023   HGBA1C 7.2 (H) 10/25/2022   HGBA1C 7.3 (H) 07/15/2022   Lab Results  Component Value Date   MICROALBUR 1.8 10/25/2022   LDLCALC 53 10/25/2022   CREATININE 1.09 10/25/2022    History Patient Active Problem List   Diagnosis Date Noted   Type 2 diabetes mellitus without complication, without long-term current use of insulin (HCC) 09/07/2018   Erectile dysfunction due to arterial insufficiency 08/07/2015   Diabetes (HCC) 07/31/2012   HTN (hypertension) 07/31/2012   Past Medical History:  Diagnosis Date   Diabetes mellitus without complication (HCC)    GERD (gastroesophageal reflux disease)    Hyperlipidemia    Hypertension    Past Surgical History:  Procedure Laterality Date   COLONOSCOPY     Allergies  Allergen Reactions   Ace Inhibitors Itching   Prior to Admission medications   Medication Sig Start Date End Date Taking? Authorizing Provider  ACCU-CHEK AVIVA PLUS test strip USE TO CHECK BLOOD SUGAR LEVELS TWICE DAILY 09/13/22  Yes Shade Flood, MD  amLODipine (NORVASC) 5 MG tablet TAKE 1 TABLET(5 MG) BY MOUTH DAILY 10/25/22  Yes Shade Flood, MD  Blood Glucose Monitoring Suppl KIT One Touch Ultra or may substitute for what insurance covers.  May check once per day 09/09/14  Yes Shade Flood, MD  losartan-hydrochlorothiazide (HYZAAR) 100-25 MG tablet Take 1 tablet by mouth daily. 10/25/22  Yes Shade Flood, MD  metFORMIN (GLUCOPHAGE) 1000 MG tablet TAKE 1 TABLET(1000 MG) BY MOUTH TWICE DAILY WITH A MEAL 10/25/22  Yes Shade Flood, MD  omeprazole (PRILOSEC) 20 MG capsule TAKE 1 CAPSULE(20 MG) BY MOUTH DAILY 10/25/22  Yes Shade Flood, MD  Semaglutide, 2 MG/DOSE, 8 MG/3ML SOPN Inject 2 mg as directed once a week. 10/25/22  Yes Shade Flood, MD  simvastatin (ZOCOR) 20 MG tablet TAKE 1 TABLET(20 MG) BY MOUTH AT BEDTIME 10/25/22  Yes Shade Flood, MD   Social History   Socioeconomic History   Marital status: Married    Spouse name: Not on file   Number of children: 3   Years of education: Not on file   Highest education level: Not on file  Occupational History   Occupation: Cook    Employer: [employer]  Tobacco Use   Smoking status: Never   Smokeless tobacco: Never  Vaping Use   Vaping Use: Never used  Substance and Sexual Activity   Alcohol use: No   Drug use: No   Sexual activity: Yes  Other Topics Concern   Not on file  Social History Narrative   Married. Education: [employer]. Exercise: Walks twice a week for 1-2 hours.   Social Determinants of Health   Financial Resource Strain: Not on file  Food  Insecurity: Not on file  Transportation Needs: Not on file  Physical Activity: Not on file  Stress: Not on file  Social Connections: Not on file  Intimate Partner Violence: Not on file    Review of Systems   Objective:   Vitals:   01/24/23 1054  BP: 102/74  Pulse: 78  Temp: 98 F (36.7 C)  TempSrc: Oral  SpO2: 98%  Weight: 182 lb 9.6 oz (82.8 kg)  Height: 5\' 9"  (1.753 m)     Physical Exam     Assessment & Plan:  Wayne Perez is a 62 y.o. male . Essential hypertension  Type 2 diabetes mellitus with hyperglycemia, without long-term current use of insulin (HCC) - Plan: POC HgB A1c  Gastroesophageal reflux disease,  unspecified whether esophagitis present  Hyperlipidemia, unspecified hyperlipidemia type   No orders of the defined types were placed in this encounter.  There are no Patient Instructions on file for this visit.    Signed,   Meredith Staggers, MD [facility], [facility] 01/24/23 11:23 AM

## 2023-01-24 NOTE — Patient Instructions (Signed)
Great news with the improved A1c, follow-up with me in 6 months.  No medication changes for now but let me know if there are any questions.  Take care.

## 2023-02-11 ENCOUNTER — Other Ambulatory Visit: Payer: Self-pay | Admitting: Family Medicine

## 2023-02-11 DIAGNOSIS — K219 Gastro-esophageal reflux disease without esophagitis: Secondary | ICD-10-CM

## 2023-02-15 ENCOUNTER — Other Ambulatory Visit: Payer: Self-pay | Admitting: Family Medicine

## 2023-02-15 DIAGNOSIS — E1165 Type 2 diabetes mellitus with hyperglycemia: Secondary | ICD-10-CM

## 2023-02-16 ENCOUNTER — Telehealth: Payer: Self-pay

## 2023-02-16 NOTE — Telephone Encounter (Signed)
Patient has been informed.

## 2023-02-16 NOTE — Telephone Encounter (Signed)
Patient Advocate Encounter  Prior Authorization for Ozempic 2MG /DOSE has been approved with [employer].    PA# [id number] Effective dates: 02/16/23 through 02/15/24  Approval letter in media

## 2023-04-06 ENCOUNTER — Other Ambulatory Visit: Payer: Self-pay | Admitting: Family Medicine

## 2023-04-06 DIAGNOSIS — E1165 Type 2 diabetes mellitus with hyperglycemia: Secondary | ICD-10-CM

## 2023-05-26 ENCOUNTER — Encounter: Payer: Self-pay | Admitting: Family Medicine

## 2023-05-26 ENCOUNTER — Telehealth (INDEPENDENT_AMBULATORY_CARE_PROVIDER_SITE_OTHER): Payer: Managed Care, Other (non HMO) | Admitting: Family Medicine

## 2023-05-26 VITALS — Wt 195.0 lb

## 2023-05-26 DIAGNOSIS — R197 Diarrhea, unspecified: Secondary | ICD-10-CM

## 2023-05-26 DIAGNOSIS — R142 Eructation: Secondary | ICD-10-CM | POA: Diagnosis not present

## 2023-05-26 NOTE — Patient Instructions (Signed)
Thanks for taking the video call today.  I am glad to hear you are feeling better today.  As we discussed you could have a viral infection or possible foodborne illness but with the improvement today and infrequent nature of the diarrhea I would not recommend any antibiotics at this time.  If you continue to have symptoms through the weekend I am happy to order a stool culture/stool testing to look for specific type of infection.  Make sure to stay hydrated, bland diet for now.  Pepto-Bismol is okay if needed temporarily as well as Imodium if needed to control diarrhea.  Continue heartburn medication.  Give me an update in the next few days, and let me know if there are questions.

## 2023-05-26 NOTE — Progress Notes (Signed)
Virtual Visit via Video Note  I connected with Wayne Perez on 05/26/23 at 11:01 AM   by a video enabled telemedicine application and verified that I am speaking with the correct person using two identifiers.  Patient location: work - in private area.  My location: office [place].    I discussed the limitations, risks, security and privacy concerns of performing an evaluation and management service by telephone and the availability of in person appointments. I also discussed with the patient that there may be a patient responsible charge related to this service. The patient expressed understanding and agreed to proceed, consent obtained  Chief complaint:  Chief Complaint  Patient presents with   Diarrhea    Started about a week ago; Will get a bad taste in his mouth after burping and diarrhea follows after that. Pt states it will come and go, has been feeling fine today. Has not been taking any stomach relief meds.     History of Present Illness: Wayne Perez is a 62 y.o. male  Diarrhea Started last week. Sour smell to belching. Off and on past week. None today, but noted yesterday. No known sick contacts.  No recent travel. No abd pain/n/v/fever.  Taking omeprazole. No heartburn.  Diarrhea up to 2-3 times per day, no blood. No symptoms today.  No recent travel.  On metformin, no prior GI side effects.  Eating and drinking ok, no recent raw foods or adventurous eating.   Patient Active Problem List   Diagnosis Date Noted   Type 2 diabetes mellitus without complication, without long-term current use of insulin (HCC) 09/07/2018   Erectile dysfunction due to arterial insufficiency 08/07/2015   Diabetes (HCC) 07/31/2012   HTN (hypertension) 07/31/2012   Past Medical History:  Diagnosis Date   Diabetes mellitus without complication (HCC)    GERD (gastroesophageal reflux disease)    Hyperlipidemia    Hypertension    Past Surgical History:  Procedure Laterality Date    COLONOSCOPY     Allergies  Allergen Reactions   Ace Inhibitors Itching   Prior to Admission medications   Medication Sig Start Date End Date Taking? Authorizing Provider  ACCU-CHEK AVIVA PLUS test strip USE TO CHECK BLOOD SUGAR LEVELS TWICE DAILY 09/13/22  Yes Shade Flood, MD  amLODipine (NORVASC) 5 MG tablet TAKE 1 TABLET(5 MG) BY MOUTH DAILY 10/25/22  Yes Shade Flood, MD  Blood Glucose Monitoring Suppl KIT One Touch Ultra or may substitute for what insurance covers.  May check once per day 09/09/14  Yes Shade Flood, MD  losartan-hydrochlorothiazide (HYZAAR) 100-25 MG tablet Take 1 tablet by mouth daily. 10/25/22  Yes Shade Flood, MD  metFORMIN (GLUCOPHAGE) 1000 MG tablet TAKE 1 TABLET(1000 MG) BY MOUTH TWICE DAILY WITH A MEAL 04/06/23  Yes Shade Flood, MD  omeprazole (PRILOSEC) 20 MG capsule TAKE 1 CAPSULE(20 MG) BY MOUTH DAILY 02/11/23  Yes Shade Flood, MD  OZEMPIC, 2 MG/DOSE, 8 MG/3ML SOPN INJECT 2 MG INTO THE SKIN AS DIRECTED ONCE A WEEK 02/16/23  Yes Shade Flood, MD  simvastatin (ZOCOR) 20 MG tablet TAKE 1 TABLET(20 MG) BY MOUTH AT BEDTIME 10/25/22  Yes Shade Flood, MD   Social History   Socioeconomic History   Marital status: Married    Spouse name: Not on file   Number of children: 3   Years of education: Not on file   Highest education level: Not on file  Occupational History   Occupation: [employer]  Employer: [employer]  Tobacco Use   Smoking status: Never   Smokeless tobacco: Never  Vaping Use   Vaping status: Never Used  Substance and Sexual Activity   Alcohol use: No   Drug use: No   Sexual activity: Yes  Other Topics Concern   Not on file  Social History Narrative   Married. Education: [employer]. Exercise: Walks twice a week for 1-2 hours.   Social Determinants of Health   Financial Resource Strain: Low Risk  (04/13/2023)   Received from [facility]   Overall Financial Resource Strain (CARDIA)    Difficulty of Paying Living  Expenses: Not hard at all  Food Insecurity: No Food Insecurity (04/13/2023)   Received from [facility]   Hunger Vital Sign    Worried About Running Out of Food in the Last Year: Never true    Ran Out of Food in the Last Year: Never true  Transportation Needs: No Transportation Needs (04/13/2023)   Received from [facility] - Transportation    Lack of Transportation (Medical): No    Lack of Transportation (Non-Medical): No  Physical Activity: Sufficiently Active (01/05/2022)   Received from [facility], [facility]   Exercise Vital Sign    Days of Exercise per Week: 5 days    Minutes of Exercise per Session: 60 min  Stress: No Stress Concern Present (01/05/2022)   Received from [facility], [facility] of Occupational Health - Occupational Stress Questionnaire    Feeling of Stress : Not at all  Social Connections: Unknown (01/08/2023)   Received from [facility], [facility]   Social Network    Social Network: Not on file  Intimate Partner Violence: Unknown (01/08/2023)   Received from [facility], [facility]   HITS    Physically Hurt: Not on file    Insult or Talk Down To: Not on file    Threaten Physical Harm: Not on file    Scream or Curse: Not on file    Observations/Objective:  Vitals:   05/26/23 1038  Weight: 195 lb (88.5 kg)  Nontoxic appearance on video, speaking in full sentences without respiratory distress, coherent responses.  Euthymic mood.  All questions answered with understanding of plan expressed.  Assessment and Plan: Diarrhea, unspecified type  Belching Intermittent diarrhea, malodorous belching past week.  No red flags on history, no abdominal pain, fever, recent travel or known sick contacts.  Well-hydrated.  Differential includes viral illness, foodborne illness but with intermittent mild symptoms would hold on testing and antibiotics at this time as we discussed potential carrier state and usual recommendations  for infectious enteritis would be symptomatic care for milder symptoms.  He is on metformin but has been on for some time, less likely cause.  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 difficile infection.  -Symptomatic care with Pepto-Bismol, Imodium if needed, continued fluids and bland diet.  Continue PPI.  Update on symptoms over the weekend and if persistent I will will order a GI profile.  Can send me a MyChart message.  RTC precautions discussed.  Follow Up Instructions: As needed.   I discussed the assessment and treatment plan with the patient. The patient was provided an opportunity to ask questions and all were answered. The patient agreed with the plan and demonstrated an understanding of the instructions.   The patient was advised to call back or seek an in-person evaluation if the symptoms worsen or  if the condition fails to improve as anticipated.   Shade Flood, MD

## 2023-08-12 ENCOUNTER — Encounter: Payer: Self-pay | Admitting: Family Medicine

## 2023-08-12 ENCOUNTER — Ambulatory Visit (INDEPENDENT_AMBULATORY_CARE_PROVIDER_SITE_OTHER): Payer: Managed Care, Other (non HMO) | Admitting: Family Medicine

## 2023-08-12 VITALS — BP 116/72 | HR 82 | Temp 97.7°F | Ht 69.25 in | Wt 181.8 lb

## 2023-08-12 DIAGNOSIS — E785 Hyperlipidemia, unspecified: Secondary | ICD-10-CM | POA: Diagnosis not present

## 2023-08-12 DIAGNOSIS — Z0000 Encounter for general adult medical examination without abnormal findings: Secondary | ICD-10-CM

## 2023-08-12 DIAGNOSIS — I10 Essential (primary) hypertension: Secondary | ICD-10-CM

## 2023-08-12 DIAGNOSIS — Z23 Encounter for immunization: Secondary | ICD-10-CM | POA: Diagnosis not present

## 2023-08-12 DIAGNOSIS — K219 Gastro-esophageal reflux disease without esophagitis: Secondary | ICD-10-CM | POA: Diagnosis not present

## 2023-08-12 DIAGNOSIS — Z7984 Long term (current) use of oral hypoglycemic drugs: Secondary | ICD-10-CM

## 2023-08-12 DIAGNOSIS — Z7985 Long-term (current) use of injectable non-insulin antidiabetic drugs: Secondary | ICD-10-CM

## 2023-08-12 DIAGNOSIS — E1165 Type 2 diabetes mellitus with hyperglycemia: Secondary | ICD-10-CM

## 2023-08-12 LAB — COMPREHENSIVE METABOLIC PANEL
ALT: 19 U/L (ref 0–53)
AST: 20 U/L (ref 0–37)
Albumin: 4.1 g/dL (ref 3.5–5.2)
Alkaline Phosphatase: 72 U/L (ref 39–117)
BUN: 16 mg/dL (ref 6–23)
CO2: 31 meq/L (ref 19–32)
Calcium: 9.4 mg/dL (ref 8.4–10.5)
Chloride: 99 meq/L (ref 96–112)
Creatinine, Ser: 1.06 mg/dL (ref 0.40–1.50)
GFR: 76.56 mL/min (ref >60.00)
Glucose, Bld: 113 mg/dL — ABNORMAL HIGH (ref 70–99)
Potassium: 3.9 meq/L (ref 3.5–5.1)
Sodium: 136 meq/L (ref 135–145)
Total Bilirubin: 0.4 mg/dL (ref 0.2–1.2)
Total Protein: 7.5 g/dL (ref 6.0–8.3)

## 2023-08-12 LAB — LIPID PANEL
Cholesterol: 127 mg/dL (ref 0–200)
HDL: 40 mg/dL (ref >39.00)
LDL Cholesterol: 69 mg/dL (ref 0–99)
NonHDL: 87.39
Total CHOL/HDL Ratio: 3
Triglycerides: 91 mg/dL (ref 0.0–149.0)
VLDL: 18.2 mg/dL (ref 0.0–40.0)

## 2023-08-12 LAB — HEMOGLOBIN A1C: Hgb A1c MFr Bld: 7.2 % — ABNORMAL HIGH (ref 4.6–6.5)

## 2023-08-12 MED ORDER — OMEPRAZOLE 20 MG PO CPDR: DELAYED_RELEASE_CAPSULE | ORAL | 2 refills | Status: DC

## 2023-08-12 MED ORDER — OZEMPIC (2 MG/DOSE) 8 MG/3ML ~~LOC~~ SOPN: 2.0000 mg | PEN_INJECTOR | SUBCUTANEOUS | 2 refills | Status: DC

## 2023-08-12 MED ORDER — METFORMIN HCL 1000 MG PO TABS: ORAL_TABLET | ORAL | 2 refills | Status: DC

## 2023-08-12 MED ORDER — SIMVASTATIN 20 MG PO TABS: ORAL_TABLET | ORAL | 2 refills | Status: DC

## 2023-08-12 MED ORDER — AMLODIPINE BESYLATE 5 MG PO TABS: ORAL_TABLET | ORAL | 2 refills | Status: DC

## 2023-08-12 MED ORDER — LOSARTAN POTASSIUM-HCTZ 100-25 MG PO TABS: 1.0000 | ORAL_TABLET | Freq: Every day | ORAL | 2 refills | Status: DC

## 2023-08-12 NOTE — Progress Notes (Unsigned)
Subjective:  Patient ID: Wayne Perez, male    DOB: 1960-10-27  Age: 62 y.o. MRN: [id number]  CC:  Chief Complaint  Patient presents with   Annual Exam    Pt is doing well, notes fasting     HPI Wayne Perez presents for Annual Exam  No health changes, no concerns. Trip to [place] in November. Good trip.    PCP, me Urology, Dr. Jewel Baize at [facility], prostate cancer, status post radical prostatectomy in 2020.  Various injections discussed for erectile dysfunction at his visit 04/13/2023 with 56-month follow-up.   Diabetes: With hyperglycemia, microalbuminuria.  Metformin 1000 mg twice daily, Ozempic 2 mg/week, glimepiride previously discontinued.  Improved A1c in May.  He is on ARB as well as statin without any side effects of medications. No n/v/abd pain, neck swelling, constipation or other side effects.   home readings - 170 high. Usually 130.  No lows - none.  Microalbumin: Normal 10/25/2022 Optho, foot exam, pneumovax: Optho appointment in July last year. Appt in February -  had to reschedule.   Lab Results  Component Value Date   HGBA1C 6.8 (A) 01/24/2023   HGBA1C 7.2 (H) 10/25/2022   HGBA1C 7.3 (H) 07/15/2022   Lab Results  Component Value Date   MICROALBUR 1.8 10/25/2022   LDLCALC 53 10/25/2022   CREATININE 1.09 10/25/2022    GERD Treated with omeprazole, stable - daily dosing. No breakthrough sx's   Hyperlipidemia: Tolerating simvastatin.  Last labs in February with LDL 53. No new myalgias.  Lab Results  Component Value Date   CHOL 124 10/25/2022   HDL 45.30 10/25/2022   LDLCALC 53 10/25/2022   TRIG 128.0 10/25/2022   CHOLHDL 3 10/25/2022   Lab Results  Component Value Date   ALT 24 10/25/2022   AST 23 10/25/2022   ALKPHOS 65 10/25/2022   BILITOT 0.4 10/25/2022   Hypertension: losartan/HCTZ 100/25 mg daily, amlodipine 5 mg daily. No new side effects.  Home readings: 120/80 range.  BP Readings from Last 3 Encounters:  08/12/23 116/72  01/24/23  102/74  10/25/22 118/60   Lab Results  Component Value Date   CREATININE 1.09 10/25/2022        08/12/2023    8:09 AM 10/25/2022    1:52 PM 07/15/2022   11:00 AM 04/02/2022   10:38 AM 09/28/2021    8:09 AM  Depression screen PHQ 2/9  Decreased Interest 0 0 0 0 0  Down, Depressed, Hopeless 0 0 0 0 0  PHQ - 2 Score 0 0 0 0 0  Altered sleeping 0  0 0 0  Tired, decreased energy 0  0 0 0  Change in appetite 0  0 0 0  Feeling bad or failure about yourself  0  0 0 0  Trouble concentrating 0  0 0 0  Moving slowly or fidgety/restless 0  0 0 0  Suicidal thoughts 0  0 0 0  PHQ-9 Score 0  0 0 0  Difficult doing work/chores    Not difficult at all Not difficult at all    Health Maintenance  Topic Date Due   COVID-19 Vaccine (3 - Pfizer risk series) 05/02/2020   OPHTHALMOLOGY EXAM  02/25/2022   HEMOGLOBIN A1C  07/27/2023   Diabetic kidney evaluation - eGFR measurement  10/26/2023   Diabetic kidney evaluation - Urine ACR  10/26/2023   Colonoscopy  05/21/2024   FOOT EXAM  08/11/2024   DTaP/Tdap/Td (3 - Td or Tdap) 05/11/2025   INFLUENZA  VACCINE  Completed   Hepatitis C Screening  Completed   HIV Screening  Completed   Zoster Vaccines- Shingrix  Completed   HPV VACCINES  Aged Out  Colonoscopy 05/2014 - repeat in 10 years. Plans at [facility].  Prostate: hx of prostate CA as above followed by urology.  Lab Results  Component Value Date   PSA 2.13 05/12/2015   PSA 2.04 03/04/2014   PSA 1.77 02/26/2013     Immunization History  Administered Date(s) Administered   Hepatitis B 03/05/2013   Hepatitis B, ADULT 04/04/2013, 09/03/2013   Influenza Inj Mdck Quad Pf 06/09/2022   Influenza Split 05/08/2011, 07/31/2012, 06/20/2013   Influenza,inj,Quad PF,6+ Mos 05/12/2015   Influenza-Unspecified 06/03/2014, 06/05/2016, 05/26/2023   PFIZER(Purple Top)SARS-COV-2 Vaccination 03/12/2020, 04/04/2020   Pneumococcal Polysaccharide-23 02/02/2017   Td 05/16/2009   Tdap 05/12/2015   Zoster  Recombinant(Shingrix) 05/30/2020, 09/28/2021   No results found. Optho as above - appt in February. Wears glasses. No vision changes.   Dental: every 6 months.   Alcohol:none  Tobacco: none  Exercise: gym 3-4 times per week. , usually cardio, pushups.    History Patient Active Problem List   Diagnosis Date Noted   Type 2 diabetes mellitus without complication, without long-term current use of insulin (HCC) 09/07/2018   Erectile dysfunction due to arterial insufficiency 08/07/2015   Diabetes (HCC) 07/31/2012   HTN (hypertension) 07/31/2012   Past Medical History:  Diagnosis Date   Diabetes mellitus without complication (HCC)    GERD (gastroesophageal reflux disease)    Hyperlipidemia    Hypertension    Past Surgical History:  Procedure Laterality Date   COLONOSCOPY     Allergies  Allergen Reactions   Ace Inhibitors Itching   Prior to Admission medications   Medication Sig Start Date End Date Taking? Authorizing Provider  ACCU-CHEK AVIVA PLUS test strip USE TO CHECK BLOOD SUGAR LEVELS TWICE DAILY 09/13/22  Yes Shade Flood, MD  amLODipine (NORVASC) 5 MG tablet TAKE 1 TABLET(5 MG) BY MOUTH DAILY 10/25/22  Yes Shade Flood, MD  Blood Glucose Monitoring Suppl KIT One Touch Ultra or may substitute for what insurance covers.  May check once per day 09/09/14  Yes Shade Flood, MD  losartan-hydrochlorothiazide (HYZAAR) 100-25 MG tablet Take 1 tablet by mouth daily. 10/25/22  Yes Shade Flood, MD  metFORMIN (GLUCOPHAGE) 1000 MG tablet TAKE 1 TABLET(1000 MG) BY MOUTH TWICE DAILY WITH A MEAL 04/06/23  Yes Shade Flood, MD  omeprazole (PRILOSEC) 20 MG capsule TAKE 1 CAPSULE(20 MG) BY MOUTH DAILY 02/11/23  Yes Shade Flood, MD  OZEMPIC, 2 MG/DOSE, 8 MG/3ML SOPN INJECT 2 MG INTO THE SKIN AS DIRECTED ONCE A WEEK 02/16/23  Yes Shade Flood, MD  simvastatin (ZOCOR) 20 MG tablet TAKE 1 TABLET(20 MG) BY MOUTH AT BEDTIME 10/25/22  Yes Shade Flood, MD    Social History   Socioeconomic History   Marital status: Married    Spouse name: Not on file   Number of children: 3   Years of education: Not on file   Highest education level: Not on file  Occupational History   Occupation: Cook    Employer: [employer]  Tobacco Use   Smoking status: Never   Smokeless tobacco: Never  Vaping Use   Vaping status: Never Used  Substance and Sexual Activity   Alcohol use: No   Drug use: No   Sexual activity: Yes  Other Topics Concern   Not on file  Social  History Narrative   Married. Education: [employer]. Exercise: Walks twice a week for 1-2 hours.   Social Determinants of Health   Financial Resource Strain: Low Risk  (04/13/2023)   Received from [facility]   Overall Financial Resource Strain (CARDIA)    Difficulty of Paying Living Expenses: Not hard at all  Food Insecurity: No Food Insecurity (04/13/2023)   Received from [facility]   Hunger Vital Sign    Worried About Running Out of Food in the Last Year: Never true    Ran Out of Food in the Last Year: Never true  Transportation Needs: No Transportation Needs (04/13/2023)   Received from [facility] - Transportation    Lack of Transportation (Medical): No    Lack of Transportation (Non-Medical): No  Physical Activity: Sufficiently Active (01/05/2022)   Received from [facility], [facility]   Exercise Vital Sign    Days of Exercise per Week: 5 days    Minutes of Exercise per Session: 60 min  Stress: No Stress Concern Present (01/05/2022)   Received from [facility], [facility] of Occupational Health - Occupational Stress Questionnaire    Feeling of Stress : Not at all  Social Connections: Unknown (01/08/2023)   Received from [facility], [facility]   Social Network    Social Network: Not on file  Intimate Partner Violence: Unknown (01/08/2023)   Received from [facility], [facility]   HITS    Physically Hurt: Not on file    Insult or  Talk Down To: Not on file    Threaten Physical Harm: Not on file    Scream or Curse: Not on file    Review of Systems  13 point review of systems per patient health survey noted.  Negative other than as indicated above or in HPI.   Objective:   Vitals:   08/12/23 0807  BP: 116/72  Pulse: 82  Temp: 97.7 F (36.5 C)  TempSrc: Temporal  SpO2: 99%  Weight: 181 lb 12.8 oz (82.5 kg)  Height: 5' 9.25" (1.759 m)   {Vitals History (Optional):23777}  Physical Exam Vitals reviewed.  Constitutional:      Appearance: He is well-developed.  HENT:     Head: Normocephalic and atraumatic.     Right Ear: External ear normal.     Left Ear: External ear normal.  Eyes:     Conjunctiva/sclera: Conjunctivae normal.     Pupils: Pupils are equal, round, and reactive to light.  Neck:     Thyroid: No thyromegaly.  Cardiovascular:     Rate and Rhythm: Normal rate and regular rhythm.     Heart sounds: Normal heart sounds.  Pulmonary:     Effort: Pulmonary effort is normal. No respiratory distress.     Breath sounds: Normal breath sounds. No wheezing.  Abdominal:     General: There is no distension.     Palpations: Abdomen is soft.     Tenderness: There is no abdominal tenderness.  Musculoskeletal:        General: No tenderness. Normal range of motion.     Cervical back: Normal range of motion and neck supple.  Lymphadenopathy:     Cervical: No cervical adenopathy.  Skin:    General: Skin is warm and dry.  Neurological:     Mental Status: He is alert and oriented to person, place, and time.     Deep Tendon Reflexes: Reflexes are normal and symmetric.  Psychiatric:  Behavior: Behavior normal.        Assessment & Plan:  Wayne Perez is a 62 y.o. male . No diagnosis found.   No orders of the defined types were placed in this encounter.  There are no Patient Instructions on file for this visit.    Signed,   Meredith Staggers, MD [facility], [facility] 08/12/23 8:20 AM

## 2023-08-12 NOTE — Progress Notes (Deleted)
Subjective:  Patient ID: Wayne Perez, male    DOB: [birth year]  Age: 62 y.o. MRN: [id number]  CC:  Chief Complaint  Patient presents with   Annual Exam    Pt is doing well, notes fasting     HPI Abba Porchia presents for   *** History Patient Active Problem List   Diagnosis Date Noted   Type 2 diabetes mellitus without complication, without long-term current use of insulin (HCC) 09/07/2018   Erectile dysfunction due to arterial insufficiency 08/07/2015   Diabetes (HCC) 07/31/2012   HTN (hypertension) 07/31/2012   Past Medical History:  Diagnosis Date   Diabetes mellitus without complication (HCC)    GERD (gastroesophageal reflux disease)    Hyperlipidemia    Hypertension    Past Surgical History:  Procedure Laterality Date   COLONOSCOPY     Allergies  Allergen Reactions   Ace Inhibitors Itching   Prior to Admission medications   Medication Sig Start Date End Date Taking? Authorizing Provider  ACCU-CHEK AVIVA PLUS test strip USE TO CHECK BLOOD SUGAR LEVELS TWICE DAILY 09/13/22  Yes Shade Flood, MD  amLODipine (NORVASC) 5 MG tablet TAKE 1 TABLET(5 MG) BY MOUTH DAILY 10/25/22  Yes Shade Flood, MD  Blood Glucose Monitoring Suppl KIT One Touch Ultra or may substitute for what insurance covers.  May check once per day 09/09/14  Yes Shade Flood, MD  losartan-hydrochlorothiazide (HYZAAR) 100-25 MG tablet Take 1 tablet by mouth daily. 10/25/22  Yes Shade Flood, MD  metFORMIN (GLUCOPHAGE) 1000 MG tablet TAKE 1 TABLET(1000 MG) BY MOUTH TWICE DAILY WITH A MEAL 04/06/23  Yes Shade Flood, MD  omeprazole (PRILOSEC) 20 MG capsule TAKE 1 CAPSULE(20 MG) BY MOUTH DAILY 02/11/23  Yes Shade Flood, MD  OZEMPIC, 2 MG/DOSE, 8 MG/3ML SOPN INJECT 2 MG INTO THE SKIN AS DIRECTED ONCE A WEEK 02/16/23  Yes Shade Flood, MD  simvastatin (ZOCOR) 20 MG tablet TAKE 1 TABLET(20 MG) BY MOUTH AT BEDTIME 10/25/22  Yes Shade Flood, MD   Social History   Socioeconomic  History   Marital status: Married    Spouse name: Not on file   Number of children: 3   Years of education: Not on file   Highest education level: Not on file  Occupational History   Occupation: Cook    Employer: [employer]  Tobacco Use   Smoking status: Never   Smokeless tobacco: Never  Vaping Use   Vaping status: Never Used  Substance and Sexual Activity   Alcohol use: No   Drug use: No   Sexual activity: Yes  Other Topics Concern   Not on file  Social History Narrative   Married. Education: [employer]. Exercise: Walks twice a week for 1-2 hours.   Social Determinants of Health   Financial Resource Strain: Low Risk  (04/13/2023)   Received from [facility]   Overall Financial Resource Strain (CARDIA)    Difficulty of Paying Living Expenses: Not hard at all  Food Insecurity: No Food Insecurity (04/13/2023)   Received from [facility]   Hunger Vital Sign    Worried About Running Out of Food in the Last Year: Never true    Ran Out of Food in the Last Year: Never true  Transportation Needs: No Transportation Needs (04/13/2023)   Received from [facility] - Transportation    Lack of Transportation (Medical): No    Lack of Transportation (Non-Medical): No  Physical Activity: Sufficiently Active (  01/05/2022)   Received from [facility], [facility]   Exercise Vital Sign    Days of Exercise per Week: 5 days    Minutes of Exercise per Session: 60 min  Stress: No Stress Concern Present (01/05/2022)   Received from [facility], [facility] of Occupational Health - Occupational Stress Questionnaire    Feeling of Stress : Not at all  Social Connections: Unknown (01/08/2023)   Received from [facility], [facility]   Social Network    Social Network: Not on file  Intimate Partner Violence: Unknown (01/08/2023)   Received from [facility], [facility]   HITS    Physically Hurt: Not on file    Insult or Talk Down To: Not on file    Threaten  Physical Harm: Not on file    Scream or Curse: Not on file    Review of Systems   Objective:   Vitals:   08/12/23 0807  BP: 116/72  Pulse: 82  Temp: 97.7 F (36.5 C)  TempSrc: Temporal  SpO2: 99%  Weight: 181 lb 12.8 oz (82.5 kg)  Height: 5' 9.25" (1.759 m)     Physical Exam     Assessment & Plan:  Ruel Tosi is a 62 y.o. male . No diagnosis found.   No orders of the defined types were placed in this encounter.  There are no Patient Instructions on file for this visit.    Signed,   Meredith Staggers, MD [facility], [facility] 08/12/23 8:12 AM

## 2023-08-12 NOTE — Patient Instructions (Signed)
Thanks for coming in today.  No medication changes at this time.  If any concerns on labs I will let you know.  Keep up the good work with exercise.  Colonoscopy will be due in September of next year, you can call [facility] and see if they need a referral.  I will see you in 6 months.  Take care.   Preventive Care 62-60 Years Old, Male Preventive care refers to lifestyle choices and visits with your health care provider that can promote health and wellness. Preventive care visits are also called wellness exams. What can I expect for my preventive care visit? Counseling During your preventive care visit, your health care provider may ask about your: Medical history, including: Past medical problems. Family medical history. Current health, including: Emotional well-being. Home life and relationship well-being. Sexual activity. Lifestyle, including: Alcohol, nicotine or tobacco, and drug use. Access to firearms. Diet, exercise, and sleep habits. Safety issues such as seatbelt and bike helmet use. Sunscreen use. Work and work Astronomer. Physical exam Your health care provider will check your: Height and weight. These may be used to calculate your BMI (body mass index). BMI is a measurement that tells if you are at a healthy weight. Waist circumference. This measures the distance around your waistline. This measurement also tells if you are at a healthy weight and may help predict your risk of certain diseases, such as type 2 diabetes and high blood pressure. Heart rate and blood pressure. Body temperature. Skin for abnormal spots. What immunizations do I need?  Vaccines are usually given at various ages, according to a schedule. Your health care provider will recommend vaccines for you based on your age, medical history, and lifestyle or other factors, such as travel or where you work. What tests do I need? Screening Your health care provider may recommend screening tests for certain  conditions. This may include: Lipid and cholesterol levels. Diabetes screening. This is done by checking your blood sugar (glucose) after you have not eaten for a while (fasting). Hepatitis B test. Hepatitis C test. HIV (human immunodeficiency virus) test. STI (sexually transmitted infection) testing, if you are at risk. Lung cancer screening. Prostate cancer screening. Colorectal cancer screening. Talk with your health care provider about your test results, treatment options, and if necessary, the need for more tests. Follow these instructions at home: Eating and drinking  Eat a diet that includes fresh fruits and vegetables, whole grains, lean protein, and low-fat dairy products. Take vitamin and mineral supplements as recommended by your health care provider. Do not drink alcohol if your health care provider tells you not to drink. If you drink alcohol: Limit how much you have to 0-2 drinks a day. Know how much alcohol is in your drink. In the [place] one drink equals one 12 oz bottle of beer (355 mL), one 5 oz glass of wine (148 mL), or one 1 oz glass of hard liquor (44 mL). Lifestyle Brush your teeth every morning and night with fluoride toothpaste. Floss one time each day. Exercise for at least 30 minutes 5 or more days each week. Do not use any products that contain nicotine or tobacco. These products include cigarettes, chewing tobacco, and vaping devices, such as e-cigarettes. If you need help quitting, ask your health care provider. Do not use drugs. If you are sexually active, practice safe sex. Use a condom or other form of protection to prevent STIs. Take aspirin only as told by your health care provider. Make sure that  you understand how much to take and what form to take. Work with your health care provider to find out whether it is safe and beneficial for you to take aspirin daily. Find healthy ways to manage stress, such as: Meditation, yoga, or listening to  music. Journaling. Talking to a trusted person. Spending time with friends and family. Minimize exposure to UV radiation to reduce your risk of skin cancer. Safety Always wear your seat belt while driving or riding in a vehicle. Do not drive: If you have been drinking alcohol. Do not ride with someone who has been drinking. When you are tired or distracted. While texting. If you have been using any mind-altering substances or drugs. Wear a helmet and other protective equipment during sports activities. If you have firearms in your house, make sure you follow all gun safety procedures. What's next? Go to your health care provider once a year for an annual wellness visit. Ask your health care provider how often you should have your eyes and teeth checked. Stay up to date on all vaccines. This information is not intended to replace advice given to you by your health care provider. Make sure you discuss any questions you have with your health care provider. Document Revised: 02/18/2021 Document Reviewed: 02/18/2021 Elsevier Patient Education  2024 [employer].

## 2023-11-15 ENCOUNTER — Other Ambulatory Visit: Payer: Self-pay | Admitting: Family Medicine

## 2023-11-15 DIAGNOSIS — E1165 Type 2 diabetes mellitus with hyperglycemia: Secondary | ICD-10-CM

## 2023-11-22 NOTE — Telephone Encounter (Unsigned)
 Copied from CRM [id number]. Topic: Clinical - Prescription Issue >> Nov 22, 2023  1:30 PM Judeth Cornfield R wrote: Reason for CRM: Patient states he needs a refill on test strips and does not have a meter and needs a new one as well. Patient states his pharmacy has sent multiple request and will no longer that since that attempted this with no correspondence. Patient is requesting this be sent today as he has not been able to test his blood sugar last week. Advised patient of the 3 day time frame but he sounded extremely worried when he was advised of that.

## 2023-11-23 ENCOUNTER — Telehealth: Payer: Self-pay

## 2023-11-23 DIAGNOSIS — E119 Type 2 diabetes mellitus without complications: Secondary | ICD-10-CM

## 2023-11-23 MED ORDER — BLOOD GLUCOSE MONITORING SUPPL KIT: PACK | 6 refills | Status: AC

## 2023-11-23 NOTE — Telephone Encounter (Signed)
 Copied from CRM [id number]. Topic: Clinical - Prescription Issue >> Nov 22, 2023  1:30 PM Judeth Cornfield R wrote: Reason for CRM: Patient states he needs a refill on test strips and does not have a meter and needs a new one as well. Patient states his pharmacy has sent multiple request and will no longer that since that attempted this with no correspondence. Patient is requesting this be sent today as he has not been able to test his blood sugar last week. Advised patient of the 3 day time frame but he sounded extremely worried when he was advised of that.

## 2023-11-23 NOTE — Telephone Encounter (Signed)
 Sent new meter per request, no answer, unable to LM

## 2023-11-23 NOTE — Addendum Note (Signed)
 Addended by: Eldred Manges on: 11/23/2023 11:30 AM   Modules accepted: Orders

## 2023-12-28 ENCOUNTER — Other Ambulatory Visit: Payer: Self-pay | Admitting: Family Medicine

## 2023-12-28 NOTE — Telephone Encounter (Unsigned)
 Copied from CRM [id number]. Topic: Clinical - Medication Refill >> Dec 28, 2023  1:37 PM Ovid Blow wrote: Most Recent Primary Care Visit:  Provider: Caro Christmas R  Department: [facility]  Visit Type: PHYSICAL  Date: 08/12/2023  Medication: ACCU-CHEK AVIVA PLUS test strip  Has the patient contacted their pharmacy? Yes (Agent: If no, request that the patient contact the pharmacy for the refill. If patient does not wish to contact the pharmacy document the reason why and proceed with request.) (Agent: If yes, when and what did the pharmacy advise?)  Is this the correct pharmacy for this prescription? Yes If no, delete pharmacy and type the correct one.  This is the patient's preferred pharmacy:  [facility] [address], [place] - [address] [place] [address] Phone: [phone] Fax: [phone]  Has the prescription been filled recently? No  Is the patient out of the medication? Yes  Has the patient been seen for an appointment in the last year OR does the patient have an upcoming appointment? Yes  Can we respond through MyChart? Yes  Agent: Please be advised that Rx refills may take up to 3 business days. We ask that you follow-up with your pharmacy.

## 2024-01-03 ENCOUNTER — Other Ambulatory Visit: Payer: Self-pay | Admitting: Family Medicine

## 2024-01-03 DIAGNOSIS — E1165 Type 2 diabetes mellitus with hyperglycemia: Secondary | ICD-10-CM

## 2024-01-03 MED ORDER — ACCU-CHEK AVIVA PLUS VI STRP: ORAL_STRIP | 3 refills | Status: AC

## 2024-01-03 NOTE — Telephone Encounter (Signed)
 Copied from CRM [id number]. Topic: Clinical - Medication Refill >> Jan 03, 2024  3:44 PM Dimple Francis wrote: Most Recent Primary Care Visit:  Provider: Caro Christmas R  Department: [facility]  Visit Type: PHYSICAL  Date: 08/12/2023  Medication: ACCU-CHEK AVIVA PLUS test strip  Has the patient contacted their pharmacy? Yes (Agent: If no, request that the patient contact the pharmacy for the refill. If patient does not wish to contact the pharmacy document the reason why and proceed with request.) (Agent: If yes, when and what did the pharmacy advise?)  Is this the correct pharmacy for this prescription? Yes If no, delete pharmacy and type the correct one.  This is the patient's preferred pharmacy:  [facility] [address] [place] - [address] [place] [address] Phone: [phone] Fax: [phone]    Has the prescription been filled recently? Yes  Is the patient out of the medication? Yes  Has the patient been seen for an appointment in the last year OR does the patient have an upcoming appointment? Yes  Can we respond through MyChart? Yes  Agent: Please be advised that Rx refills may take up to 3 business days. We ask that you follow-up with your pharmacy.

## 2024-02-01 ENCOUNTER — Ambulatory Visit (INDEPENDENT_AMBULATORY_CARE_PROVIDER_SITE_OTHER): Admitting: Family Medicine

## 2024-02-01 ENCOUNTER — Encounter: Payer: Self-pay | Admitting: Family Medicine

## 2024-02-01 VITALS — BP 112/78 | HR 77 | Ht 69.0 in | Wt 182.0 lb

## 2024-02-01 DIAGNOSIS — E785 Hyperlipidemia, unspecified: Secondary | ICD-10-CM

## 2024-02-01 DIAGNOSIS — Z79899 Other long term (current) drug therapy: Secondary | ICD-10-CM | POA: Diagnosis not present

## 2024-02-01 DIAGNOSIS — K219 Gastro-esophageal reflux disease without esophagitis: Secondary | ICD-10-CM

## 2024-02-01 DIAGNOSIS — E119 Type 2 diabetes mellitus without complications: Secondary | ICD-10-CM | POA: Diagnosis not present

## 2024-02-01 DIAGNOSIS — I10 Essential (primary) hypertension: Secondary | ICD-10-CM | POA: Diagnosis not present

## 2024-02-01 DIAGNOSIS — E559 Vitamin D deficiency, unspecified: Secondary | ICD-10-CM

## 2024-02-01 DIAGNOSIS — E1165 Type 2 diabetes mellitus with hyperglycemia: Secondary | ICD-10-CM

## 2024-02-01 LAB — MICROALBUMIN / CREATININE URINE RATIO
Creatinine,U: 61.9 mg/dL
Microalb Creat Ratio: 76.3 mg/g — ABNORMAL HIGH (ref 0.0–30.0)
Microalb, Ur: 4.7 mg/dL — ABNORMAL HIGH (ref 0.0–1.9)

## 2024-02-01 MED ORDER — OMEPRAZOLE 20 MG PO CPDR: DELAYED_RELEASE_CAPSULE | ORAL | 2 refills | Status: AC

## 2024-02-01 MED ORDER — AMLODIPINE BESYLATE 5 MG PO TABS: ORAL_TABLET | ORAL | 2 refills | Status: AC

## 2024-02-01 MED ORDER — METFORMIN HCL 1000 MG PO TABS: ORAL_TABLET | ORAL | 2 refills | Status: AC

## 2024-02-01 MED ORDER — SIMVASTATIN 20 MG PO TABS
ORAL_TABLET | ORAL | 2 refills | Status: AC
Start: 2024-02-01 — End: ?

## 2024-02-01 MED ORDER — LOSARTAN POTASSIUM-HCTZ 100-25 MG PO TABS: 1.0000 | ORAL_TABLET | Freq: Every day | ORAL | 2 refills | Status: AC

## 2024-02-01 MED ORDER — OZEMPIC (2 MG/DOSE) 8 MG/3ML ~~LOC~~ SOPN: 2.0000 mg | PEN_INJECTOR | SUBCUTANEOUS | 2 refills | Status: DC

## 2024-02-01 NOTE — Progress Notes (Signed)
 Subjective:  Patient ID: Wayne Perez, male    DOB: [birth year]  Age: 63 y.o. MRN: [id number]  CC:  Chief Complaint  Patient presents with   Follow-up    HPI [name] presents for follow up. Has been under care of ortho for left shoulder issues - improving.   Diabetes: With hyperglycemia, microalbuminuria.  Metformin  1000 mg twice daily, Ozempic  2 mg/week.  He is on ARB and statin.  No new meds side effects.  Denies swelling constipation nausea vomiting or abdominal pain with use of Ozempic  - pharmacy only filling monthly - paper rx requested to look into options.  Slight increase in A1c to 7.2 in December, continue same regimen.  Watch diet/activity. Home readings fasting: 140-150 Postprandial:160's No symptomatic lows. Microalbumin: Due, normal in February 2024. Optho, foot exam, pneumovax:  Planned to reschedule ophtho exam - had last week - 01/27/24. Dr. Buck Carbon - requesting record for diabetic retinopathy screening.  COVID booster:  Lab Results  Component Value Date   HGBA1C 7.2 (H) 08/12/2023   HGBA1C 6.8 (A) 01/24/2023   HGBA1C 7.2 (H) 10/25/2022   Lab Results  Component Value Date   MICROALBUR 1.8 10/25/2022   LDLCALC 69 08/12/2023   CREATININE 1.06 08/12/2023   GERD Treated with omeprazole , daily dosing without breakthrough symptoms. Daily use. Did not tolerate intermittent dosing.  Colonoscopy few months ago.  Lab Results  Component Value Date   WBC 5.0 03/04/2014   HGB 14.1 03/04/2014   HCT 42.4 03/04/2014   MCV 74.5 (L) 03/04/2014   PLT 192 03/04/2014   Lab Results  Component Value Date   VITAMINB12 537 11/01/2016  Last vitamin D  Lab Results  Component Value Date   VD25OH 29.0 (L) 11/01/2016  No supplement.    Hyperlipidemia: Treated with simvastatin  20mg  without any new myalgias.  LDL overall stable last December. Lab Results  Component Value Date   CHOL 127 08/12/2023   HDL 40.00 08/12/2023   LDLCALC 69 08/12/2023   TRIG 91.0 08/12/2023    CHOLHDL 3 08/12/2023   Lab Results  Component Value Date   ALT 19 08/12/2023   AST 20 08/12/2023   ALKPHOS 72 08/12/2023   BILITOT 0.4 08/12/2023   Hypertension: Treated with amlodipine  5 mg daily, losartan /HCTZ 100/25 mg daily without any side effects and home readings are in the 120-130/80 range.occasional 100? = discussed OV for meter verification if elevated readings.   BP Readings from Last 3 Encounters:  02/01/24 112/78  08/12/23 116/72  01/24/23 102/74   Lab Results  Component Value Date   CREATININE 1.06 08/12/2023     History Patient Active Problem List   Diagnosis Date Noted   Type 2 diabetes mellitus without complication, without long-term current use of insulin  (HCC) 09/07/2018   Erectile dysfunction due to arterial insufficiency 08/07/2015   Diabetes (HCC) 07/31/2012   HTN (hypertension) 07/31/2012   Past Medical History:  Diagnosis Date   Diabetes mellitus without complication (HCC)    GERD (gastroesophageal reflux disease)    Hyperlipidemia    Hypertension    Past Surgical History:  Procedure Laterality Date   COLONOSCOPY     Allergies  Allergen Reactions   Ace Inhibitors Itching   Prior to Admission medications   Medication Sig Start Date End Date Taking? Authorizing Provider  amLODipine  (NORVASC ) 5 MG tablet TAKE 1 TABLET(5 MG) BY MOUTH DAILY 08/12/23  Yes Benjiman Bras, MD  Blood Glucose Monitoring Suppl KIT One Touch Ultra or may substitute for what insurance  covers.  May check once per day 11/23/23  Yes Benjiman Bras, MD  diclofenac (VOLTAREN) 75 MG EC tablet Take 75 mg by mouth. 12/30/23  Yes [provider]  glimepiride  (AMARYL ) 2 MG tablet Take 2 mg by mouth. 04/20/18  Yes [provider]  glucose blood (ACCU-CHEK AVIVA PLUS) test strip Use as instructed 01/03/24  Yes Benjiman Bras, MD  losartan -hydrochlorothiazide (HYZAAR) 100-25 MG tablet Take 1 tablet by mouth daily. 08/12/23  Yes Benjiman Bras, MD  metFORMIN   (GLUCOPHAGE ) 1000 MG tablet TAKE 1 TABLET(1000 MG) BY MOUTH TWICE DAILY WITH A MEAL 08/12/23  Yes Benjiman Bras, MD  omeprazole  (PRILOSEC) 20 MG capsule TAKE 1 CAPSULE(20 MG) BY MOUTH DAILY 08/12/23  Yes Benjiman Bras, MD  Semaglutide , 2 MG/DOSE, (OZEMPIC , 2 MG/DOSE,) 8 MG/3ML SOPN Inject 2 mg into the skin once a week. 08/12/23  Yes Benjiman Bras, MD  sildenafil (VIAGRA) 100 MG tablet Take 100 mg by mouth as needed. 09/27/23  Yes [provider]  simvastatin  (ZOCOR ) 20 MG tablet TAKE 1 TABLET(20 MG) BY MOUTH AT BEDTIME 08/12/23  Yes Benjiman Bras, MD   Social History   Socioeconomic History   Marital status: Married    Spouse name: Not on file   Number of children: 3   Years of education: Not on file   Highest education level: Not on file  Occupational History   Occupation: Cook    Employer: [employer]  Tobacco Use   Smoking status: Never   Smokeless tobacco: Never  Vaping Use   Vaping status: Never Used  Substance and Sexual Activity   Alcohol use: No   Drug use: No   Sexual activity: Yes  Other Topics Concern   Not on file  Social History Narrative   Married. Education: [employer]. Exercise: Walks twice a week for 1-2 hours.   Social Drivers of Corporate investment banker Strain: Low Risk  (04/13/2023)   Received from [facility]   Overall Financial Resource Strain (CARDIA)    Difficulty of Paying Living Expenses: Not hard at all  Food Insecurity: No Food Insecurity (04/13/2023)   Received from [facility]   Hunger Vital Sign    Worried About Running Out of Food in the Last Year: Never true    Ran Out of Food in the Last Year: Never true  Transportation Needs: No Transportation Needs (04/13/2023)   Received [facility] - Transportation    Lack of Transportation (Medical): No    Lack of Transportation (Non-Medical): No  Physical Activity: Sufficiently Active (01/05/2022)   [facility]   Exercise Vital Sign     Days of Exercise per Week: 5 days    Minutes of Exercise per Session: 60 min  Stress: No Stress Concern Present (01/05/2022)   Received from [facility] - Occupational Stress Questionnaire    Feeling of Stress : Not at all  Social Connections: Unknown (01/08/2023)   Received from [facility]   Social Network    Social Network: Not on file  Intimate Partner Violence: Unknown (01/08/2023)   Received from [facility]   HITS    Physically Hurt: Not on file    Insult or Talk Down To: Not on file    Threaten Physical Harm: Not on file    Scream or Curse: Not on file    Review  of Systems  Constitutional:  Negative for fatigue and unexpected weight change.  Eyes:  Negative for visual disturbance.  Respiratory:  Negative for cough, chest tightness and shortness of breath.   Cardiovascular:  Negative for chest pain, palpitations and leg swelling.  Gastrointestinal:  Negative for abdominal pain and blood in stool.  Neurological:  Negative for dizziness, light-headedness and headaches.     Objective:   Vitals:   02/01/24 1027  BP: 112/78  Pulse: 77  SpO2: 99%  Weight: 182 lb (82.6 kg)  Height: 5\' 9"  (1.753 m)     Physical Exam Vitals reviewed.  Constitutional:      Appearance: He is well-developed.  HENT:     Head: Normocephalic and atraumatic.  Neck:     Vascular: No carotid bruit or JVD.  Cardiovascular:     Rate and Rhythm: Normal rate and regular rhythm.     Heart sounds: Normal heart sounds. No murmur heard. Pulmonary:     Effort: Pulmonary effort is normal.     Breath sounds: Normal breath sounds. No rales.  Musculoskeletal:     Right lower leg: No edema.     Left lower leg: No edema.  Skin:    General: Skin is warm and dry.  Neurological:     Mental Status: He is alert and oriented to person, place, and time.  Psychiatric:        Mood and Affect: Mood normal.         Assessment & Plan:  Logen Heintzelman is a 63 y.o. male . Type 2 diabetes mellitus without complication, without long-term current use of insulin  (HCC) - Plan: Comprehensive metabolic panel with GFR, Lipid panel, Hemoglobin A1c, Microalbumin / creatinine urine ratio  - Tolerating current regimen.  Continue same, check A1c, labs as above and adjust plan accordingly  Hyperlipidemia, unspecified hyperlipidemia type - Plan: Comprehensive metabolic panel with GFR, Lipid panel  - Tolerating statin current dose, check labs and adjust plan accordingly.  Essential hypertension - Plan: losartan -hydrochlorothiazide (HYZAAR) 100-25 MG tablet  -  Stable, tolerating current regimen. Medications refilled. Labs pending as above.  If elevated readings at home, advised office visit with his meter to verify accuracy  Gastroesophageal reflux disease, unspecified whether esophagitis present - Plan: omeprazole  (PRILOSEC) 20 MG capsule  - Stable control with daily dosing, insufficient control with intermittent dosing previously.  Given consistent daily dosing and we will check for some deficiencies with B12, magnesium , vitamin D  which has been low previously.  Current use of proton pump inhibitor - Plan: Magnesium , B12 Vitamin D  deficiency - Plan: Vitamin D  (25 hydroxy)  Type 2 diabetes mellitus with hyperglycemia, without long-term current use of insulin  (HCC) - Plan: amLODipine  (NORVASC ) 5 MG tablet, losartan -hydrochlorothiazide (HYZAAR) 100-25 MG tablet, metFORMIN  (GLUCOPHAGE ) 1000 MG tablet, Semaglutide , 2 MG/DOSE, (OZEMPIC , 2 MG/DOSE,) 8 MG/3ML SOPN, simvastatin  (ZOCOR ) 20 MG tablet As above.  Requesting ophthalmology notes from recent visit.  Meds ordered this encounter  Medications   amLODipine  (NORVASC ) 5 MG tablet    Sig: TAKE 1 TABLET(5 MG) BY MOUTH DAILY    Dispense:  90 tablet    Refill:  2   losartan -hydrochlorothiazide (HYZAAR) 100-25 MG tablet    Sig: Take 1 tablet by mouth daily.     Dispense:  90 tablet    Refill:  2   metFORMIN  (GLUCOPHAGE ) 1000 MG tablet    Sig: TAKE 1 TABLET(1000 MG) BY MOUTH TWICE DAILY WITH A MEAL    Dispense:  180 tablet  Refill:  2   omeprazole  (PRILOSEC) 20 MG capsule    Sig: TAKE 1 CAPSULE(20 MG) BY MOUTH DAILY    Dispense:  90 capsule    Refill:  2   Semaglutide , 2 MG/DOSE, (OZEMPIC , 2 MG/DOSE,) 8 MG/3ML SOPN    Sig: Inject 2 mg into the skin once a week.    Dispense:  6 mL    Refill:  2   simvastatin  (ZOCOR ) 20 MG tablet    Sig: TAKE 1 TABLET(20 MG) BY MOUTH AT BEDTIME    Dispense:  90 tablet    Refill:  2   Patient Instructions  I printed the Ozempic  to see if you are able to get that filled for a 42-month supply elsewhere, but that may be an insurance requirement, not pharmacy issue.  Other prescriptions refilled.  I will check labs today including some of the tests that we will intermittently check with use of proton pump inhibitor for heartburn/reflux.  I will also check the vitamin D  level as it was borderline low previously.  No medication changes at this time.  Take care!      Signed,   Caro Christmas, MD [facility], [facility] 02/01/24 11:04 AM

## 2024-02-01 NOTE — Patient Instructions (Signed)
 I printed the Ozempic  to see if you are able to get that filled for a 81-month supply elsewhere, but that may be an insurance requirement, not pharmacy issue.  Other prescriptions refilled.  I will check labs today including some of the tests that we will intermittently check with use of proton pump inhibitor for heartburn/reflux.  I will also check the vitamin D  level as it was borderline low previously.  No medication changes at this time.  Take care!

## 2024-02-01 NOTE — Progress Notes (Signed)
 error

## 2024-02-02 LAB — LIPID PANEL
Cholesterol: 126 mg/dL (ref 0–200)
HDL: 49.5 mg/dL (ref >39.00)
LDL Cholesterol: 58 mg/dL (ref 0–99)
NonHDL: 76.88
Total CHOL/HDL Ratio: 3
Triglycerides: 94 mg/dL (ref 0.0–149.0)
VLDL: 18.8 mg/dL (ref 0.0–40.0)

## 2024-02-02 LAB — COMPREHENSIVE METABOLIC PANEL WITH GFR
ALT: 34 U/L (ref 0–53)
AST: 35 U/L (ref 0–37)
Albumin: 4.1 g/dL (ref 3.5–5.2)
Alkaline Phosphatase: 70 U/L (ref 39–117)
BUN: 12 mg/dL (ref 6–23)
CO2: 29 meq/L (ref 19–32)
Calcium: 9.1 mg/dL (ref 8.4–10.5)
Chloride: 99 meq/L (ref 96–112)
Creatinine, Ser: 0.96 mg/dL (ref 0.40–1.50)
GFR: 85.94 mL/min (ref >60.00)
Glucose, Bld: 102 mg/dL — ABNORMAL HIGH (ref 70–99)
Potassium: 4.2 meq/L (ref 3.5–5.1)
Sodium: 135 meq/L (ref 135–145)
Total Bilirubin: 0.6 mg/dL (ref 0.2–1.2)
Total Protein: 7.3 g/dL (ref 6.0–8.3)

## 2024-02-02 LAB — HEMOGLOBIN A1C: Hgb A1c MFr Bld: 7.1 % — ABNORMAL HIGH (ref 4.6–6.5)

## 2024-02-02 LAB — VITAMIN B12: Vitamin B-12: 389 pg/mL (ref 211–911)

## 2024-02-02 LAB — VITAMIN D 25 HYDROXY (VIT D DEFICIENCY, FRACTURES): VITD: 31.76 ng/mL (ref 30.00–100.00)

## 2024-02-02 LAB — MAGNESIUM: Magnesium: 1.8 mg/dL (ref 1.5–2.5)

## 2024-02-07 ENCOUNTER — Ambulatory Visit: Payer: Self-pay | Admitting: Family Medicine

## 2024-02-07 ENCOUNTER — Other Ambulatory Visit (HOSPITAL_COMMUNITY): Payer: Self-pay

## 2024-02-10 ENCOUNTER — Ambulatory Visit: Payer: Managed Care, Other (non HMO) | Admitting: Family Medicine

## 2024-02-10 ENCOUNTER — Telehealth: Payer: Self-pay

## 2024-02-10 DIAGNOSIS — E119 Type 2 diabetes mellitus without complications: Secondary | ICD-10-CM

## 2024-02-10 DIAGNOSIS — E1129 Type 2 diabetes mellitus with other diabetic kidney complication: Secondary | ICD-10-CM

## 2024-02-10 NOTE — Telephone Encounter (Signed)
 Copied from CRM [id number]. Topic: Clinical - Lab/Test Results >> Feb 10, 2024  3:13 PM Abigail D wrote: Reason for CRM: Patient called to go over lab results from Dr. Ester Helms and he will go with the recommendation to start the jardiance. He has work so is unable to schedule a visit but he said that it can be discussed at his 3 month visit if you want to send the medication now he said that is fine. He said if needed a quick phone call would be fine.

## 2024-02-11 MED ORDER — EMPAGLIFLOZIN 10 MG PO TABS: 10.0000 mg | ORAL_TABLET | Freq: Every day | ORAL | 1 refills | Status: DC

## 2024-02-11 NOTE — Addendum Note (Signed)
 Addended by: Kimmie Berggren R on: 02/11/2024 09:03 AM   Modules accepted: Orders

## 2024-02-11 NOTE — Telephone Encounter (Signed)
 See labs

## 2024-02-23 ENCOUNTER — Other Ambulatory Visit (HOSPITAL_COMMUNITY): Payer: Self-pay

## 2024-02-23 ENCOUNTER — Telehealth: Payer: Self-pay

## 2024-02-23 NOTE — Telephone Encounter (Signed)
 Pharmacy Patient Advocate Encounter   Received notification from CoverMyMeds that prior authorization for Ozempic  8 is required/requested.   Insurance verification completed.   The patient is insured through [facility] .   Per test claim: The current 28 day co-pay is, $355.77 due to patient deductible.  No PA needed at this time. This test claim was processed through [facility]- copay amounts may vary at other pharmacies due to pharmacy/plan contracts, or as the patient moves through the different stages of their insurance plan.

## 2024-02-27 ENCOUNTER — Telehealth: Payer: Self-pay

## 2024-02-27 NOTE — Telephone Encounter (Signed)
 Copied from CRM [id number]. Topic: Clinical - Medication Prior Auth >> Feb 27, 2024  4:24 PM Sophia H wrote: Reason for CRM: Patient states he went to pick up his Semaglutide , 2 MG/DOSE, (OZEMPIC , 2 MG/DOSE,) 8 MG/3ML SOPN and was told that the medication requires a prior authorization. Please advise, [phone]  [facility] -  [address]  [place], [place] [address] [phone]

## 2024-02-28 ENCOUNTER — Other Ambulatory Visit: Payer: Self-pay

## 2024-02-28 ENCOUNTER — Other Ambulatory Visit (HOSPITAL_COMMUNITY): Payer: Self-pay

## 2024-02-28 DIAGNOSIS — E1165 Type 2 diabetes mellitus with hyperglycemia: Secondary | ICD-10-CM

## 2024-02-28 MED ORDER — OZEMPIC (2 MG/DOSE) 8 MG/3ML ~~LOC~~ SOPN: 2.0000 mg | PEN_INJECTOR | SUBCUTANEOUS | 2 refills | Status: AC

## 2024-02-28 NOTE — Telephone Encounter (Signed)
 Looks like Rx printed, has been re sent now, called in an attempt to inform patient, no answer unable to LM

## 2024-02-28 NOTE — Telephone Encounter (Signed)
 Please start on this PA if you have not already

## 2024-02-29 ENCOUNTER — Telehealth: Payer: Self-pay

## 2024-02-29 NOTE — Telephone Encounter (Signed)
 Copied from CRM [id number]. Topic: Clinical - Prescription Issue >> Feb 29, 2024 11:51 AM Ernestene SQUIBB wrote: Reason for CRM: Pt prefer to have prescription Semaglutide , 2 MG/DOSE, (OZEMPIC , 2 MG/DOSE,) 8 MG/3ML SOPN [id number] [address] [place] - [address]  [phone]

## 2024-02-29 NOTE — Telephone Encounter (Signed)
 Did attempt to reach patient due to being told multiple times through teams he is calling but keeps hanging up before E2C2 can get him transfer to us . Patient did not answer, I did leave a VM to return call and let E2C2 to know to directly transfer to our clinic.   -[facility] CRM completion

## 2024-02-29 NOTE — Telephone Encounter (Signed)
 Did attempt to reach patient due to being told multiple times through teams he is calling but keeps hanging up before E2C2 can get him transfer to us . Patient did not answer, I did leave a VM to return call and let E2C2 to know to directly transfer to our clinic.

## 2024-02-29 NOTE — Telephone Encounter (Signed)
 Called and LM to call back so we can discuss this

## 2024-03-01 ENCOUNTER — Telehealth: Payer: Self-pay

## 2024-03-01 ENCOUNTER — Other Ambulatory Visit (HOSPITAL_COMMUNITY): Payer: Self-pay

## 2024-03-01 NOTE — Telephone Encounter (Signed)
 Called patient and informed him stated he will go to pharmacy today and discuss with them

## 2024-03-01 NOTE — Telephone Encounter (Signed)
 See encounter 02/23/24  Pharmacy Patient Advocate Encounter   Received notification from CoverMyMeds that prior authorization for Ozempic  8 is required/requested.   Insurance verification completed.   The patient is insured through [facility] .   Per test claim: The current 28 day co-pay is, $355.67.  No PA needed at this time. This test claim was processed through [facility]- copay amounts may vary at other pharmacies due to pharmacy/plan contracts, or as the patient moves through the different stages of their insurance plan.

## 2024-04-10 ENCOUNTER — Other Ambulatory Visit: Payer: Self-pay | Admitting: Family Medicine

## 2024-04-10 DIAGNOSIS — E1129 Type 2 diabetes mellitus with other diabetic kidney complication: Secondary | ICD-10-CM

## 2024-05-04 ENCOUNTER — Ambulatory Visit: Admitting: Family Medicine

## 2024-05-31 ENCOUNTER — Encounter: Payer: Self-pay | Admitting: Family Medicine

## 2024-05-31 ENCOUNTER — Ambulatory Visit: Admitting: Family Medicine

## 2024-05-31 DIAGNOSIS — E1165 Type 2 diabetes mellitus with hyperglycemia: Secondary | ICD-10-CM

## 2024-05-31 DIAGNOSIS — R809 Proteinuria, unspecified: Secondary | ICD-10-CM

## 2024-05-31 DIAGNOSIS — E1129 Type 2 diabetes mellitus with other diabetic kidney complication: Secondary | ICD-10-CM | POA: Diagnosis not present

## 2024-05-31 LAB — COMPREHENSIVE METABOLIC PANEL WITH GFR
ALT: 22 U/L (ref 0–53)
AST: 27 U/L (ref 0–37)
Albumin: 4.1 g/dL (ref 3.5–5.2)
Alkaline Phosphatase: 67 U/L (ref 39–117)
BUN: 17 mg/dL (ref 6–23)
CO2: 27 meq/L (ref 19–32)
Calcium: 9.7 mg/dL (ref 8.4–10.5)
Chloride: 102 meq/L (ref 96–112)
Creatinine, Ser: 1 mg/dL (ref 0.40–1.50)
GFR: 81.65 mL/min (ref >60.00)
Glucose, Bld: 132 mg/dL — ABNORMAL HIGH (ref 70–99)
Potassium: 3.9 meq/L (ref 3.5–5.1)
Sodium: 137 meq/L (ref 135–145)
Total Bilirubin: 0.4 mg/dL (ref 0.2–1.2)
Total Protein: 7.6 g/dL (ref 6.0–8.3)

## 2024-05-31 LAB — HEMOGLOBIN A1C: Hgb A1c MFr Bld: 7.3 % — ABNORMAL HIGH (ref 4.6–6.5)

## 2024-05-31 MED ORDER — EMPAGLIFLOZIN 10 MG PO TABS: 10.0000 mg | ORAL_TABLET | Freq: Every day | ORAL | 2 refills | Status: DC

## 2024-05-31 MED ORDER — EMPAGLIFLOZIN 10 MG PO TABS: 10.0000 mg | ORAL_TABLET | Freq: Every day | ORAL | 2 refills | Status: AC

## 2024-05-31 NOTE — Addendum Note (Signed)
 Addended by: HONOR BERN A on: 05/31/2024 11:27 AM   Modules accepted: Orders

## 2024-05-31 NOTE — Progress Notes (Signed)
 Subjective:  Patient ID: Wayne Perez, male    DOB: 09-14-1960  Age: 63 y.o. MRN: [id number]  CC:  Chief Complaint  Patient presents with   Diabetes    No questions or concerns.     HPI [name] presents for  follow up.   Returned 2 weeks ago from [place] to see family.   Diabetes: With hyperglycemia, microalbuminuria.  Treated with metformin  1000 mg twice daily - no GI side effects. Ozempic  2 mg/week and he is on ARB and statin. Denies any medication side effects including no nausea vomiting abdominal pain or constipation, no new neck swelling.  Borderline control with A1c of 7.1 in May, slightly improved from previous 7.2. Hypertension, hyperlipidemia discussed in May. He did have elevated urine protein test previously, and although he is on ARB, recommended addition of SGLT2 they can help protect kidneys as well as his blood sugar.  We did start Jardiance  10 mg daily on August 6. Denies mycotic or UTI symptoms. Home readings: Fasting: 130-150 Postprandial: none.  No symptomatic lows.   Microalbumin: ratio 76 on 02/01/24.   Lab Results  Component Value Date   HGBA1C 7.1 (H) 02/01/2024   HGBA1C 7.2 (H) 08/12/2023   HGBA1C 6.8 (A) 01/24/2023   Lab Results  Component Value Date   MICROALBUR 4.7 (H) 02/01/2024   LDLCALC 58 02/01/2024   CREATININE 0.96 02/01/2024    History Patient Active Problem List   Diagnosis Date Noted   Type 2 diabetes mellitus without complication, without long-term current use of insulin  (HCC) 09/07/2018   Erectile dysfunction due to arterial insufficiency 08/07/2015   Diabetes (HCC) 07/31/2012   HTN (hypertension) 07/31/2012   Past Medical History:  Diagnosis Date   Diabetes mellitus without complication (HCC)    GERD (gastroesophageal reflux disease)    Hyperlipidemia    Hypertension    Past Surgical History:  Procedure Laterality Date   COLONOSCOPY     Allergies  Allergen Reactions   Ace Inhibitors Itching   Prior to  Admission medications   Medication Sig Start Date End Date Taking? Authorizing Provider  amLODipine  (NORVASC ) 5 MG tablet TAKE 1 TABLET(5 MG) BY MOUTH DAILY 02/01/24  Yes Levora Reyes SAUNDERS, MD  Blood Glucose Monitoring Suppl KIT One Touch Ultra or may substitute for what insurance covers.  May check once per day 11/23/23  Yes Levora Reyes SAUNDERS, MD  glucose blood (ACCU-CHEK AVIVA PLUS) test strip Use as instructed 01/03/24  Yes Levora Reyes SAUNDERS, MD  JARDIANCE  10 MG TABS tablet TAKE 1 TABLET(10 MG) BY MOUTH DAILY BEFORE BREAKFAST 04/11/24  Yes Levora Reyes SAUNDERS, MD  losartan -hydrochlorothiazide (HYZAAR) 100-25 MG tablet Take 1 tablet by mouth daily. 02/01/24  Yes Levora Reyes SAUNDERS, MD  metFORMIN  (GLUCOPHAGE ) 1000 MG tablet TAKE 1 TABLET(1000 MG) BY MOUTH TWICE DAILY WITH A MEAL 02/01/24  Yes Levora Reyes SAUNDERS, MD  omeprazole  (PRILOSEC) 20 MG capsule TAKE 1 CAPSULE(20 MG) BY MOUTH DAILY 02/01/24  Yes Levora Reyes SAUNDERS, MD  Semaglutide , 2 MG/DOSE, (OZEMPIC , 2 MG/DOSE,) 8 MG/3ML SOPN Inject 2 mg into the skin once a week. 02/28/24  Yes Levora Reyes SAUNDERS, MD  sildenafil (VIAGRA) 100 MG tablet Take 100 mg by mouth as needed. 09/27/23  Yes [provider]  simvastatin  (ZOCOR ) 20 MG tablet TAKE 1 TABLET(20 MG) BY MOUTH AT BEDTIME 02/01/24  Yes Levora Reyes SAUNDERS, MD  diclofenac (VOLTAREN) 75 MG EC tablet Take 75 mg by mouth. Patient not taking: Reported on 05/31/2024 12/30/23  [provider]   Social History   Socioeconomic History   Marital status: Married    Spouse name: Not on file   Number of children: 3   Years of education: Not on file   Highest education level: Not on file  Occupational History   Occupation: Cook    Employer: [employer]  Tobacco Use   Smoking status: Never   Smokeless tobacco: Never  Vaping Use   Vaping status: Never Used  Substance and Sexual Activity   Alcohol use: No   Drug use: No   Sexual activity: Yes  Other Topics Concern   Not on file  Social History  Narrative   Married. Education: [employer]. Exercise: Walks twice a week for 1-2 hours.   Social Drivers of Corporate investment banker Strain: Low Risk  (02/06/2024)   Received from [facility]   Overall Financial Resource Strain (CARDIA)    Difficulty of Paying Living Expenses: Not hard at all  Food Insecurity: No Food Insecurity (02/06/2024)   Received from [facility]   Hunger Vital Sign    Within the past 12 months, you worried that your food would run out before you got the money to buy more.: Never true    Within the past 12 months, the food you bought just didn't last and you didn't have money to get more.: Never true  Transportation Needs: No Transportation Needs (02/06/2024)   Received from [facility] - Transportation    Lack of Transportation (Medical): No    Lack of Transportation (Non-Medical): No  Physical Activity: Sufficiently Active (01/05/2022)   Received from [facility]   Exercise Vital Sign    On average, how many days per week do you engage in moderate to strenuous exercise (like a brisk walk)?: 5 days    On average, how many minutes do you engage in exercise at this level?: 60 min  Stress: No Stress Concern Present (01/05/2022)   Received from [facility] - Occupational Stress Questionnaire    Feeling of Stress : Not at all  Social Connections: Unknown (01/08/2023)   Received from [facility]   Social Network    Social Network: Not on file  Intimate Partner Violence: Unknown (01/08/2023)   Received from [facility]   HITS    Physically Hurt: Not on file    Insult or Talk Down To: Not on file    Threaten Physical Harm: Not on file    Scream or Curse: Not on file    Review of Systems  Constitutional:  Negative for fatigue and unexpected weight change.  Eyes:  Negative for visual disturbance.  Respiratory:  Negative for cough, chest tightness and shortness of breath.   Cardiovascular:  Negative for  chest pain, palpitations and leg swelling.  Gastrointestinal:  Negative for abdominal pain and blood in stool.  Neurological:  Negative for dizziness, light-headedness and headaches.     Objective:   Vitals:   05/31/24 1105  BP: 104/80  Pulse: 79  Resp: 16  Temp: 98.2 F (36.8 C)  TempSrc: Temporal  SpO2: 100%  Weight: 169 lb 9.6 oz (76.9 kg)  Height: 5' 9 (1.753 m)     Physical Exam Vitals reviewed.  Constitutional:      Appearance: He is well-developed.  HENT:     Head: Normocephalic and atraumatic.  Neck:     Vascular: No carotid bruit or JVD.  Cardiovascular:  Rate and Rhythm: Normal rate and regular rhythm.     Heart sounds: Normal heart sounds. No murmur heard. Pulmonary:     Effort: Pulmonary effort is normal.     Breath sounds: Normal breath sounds. No rales.  Musculoskeletal:     Right lower leg: No edema.     Left lower leg: No edema.  Skin:    General: Skin is warm and dry.  Neurological:     Mental Status: He is alert and oriented to person, place, and time.  Psychiatric:        Mood and Affect: Mood normal.        Assessment & Plan:  Justan Gaede is a 63 y.o. male . Type 2 diabetes mellitus with hyperglycemia, without long-term current use of insulin  (HCC) - Plan: Comprehensive metabolic panel with GFR, Hemoglobin A1c  Diabetes mellitus with microalbuminuria (HCC) - Plan: empagliflozin  (JARDIANCE ) 10 MG TABS tablet, Comprehensive metabolic panel with GFR, Hemoglobin A1c  Tolerating current med regimen including the addition of Jardiance .  Check A1c, CMP and adjust med accordingly, but for now we will continue same regimen.  80-month follow-up.  Meds ordered this encounter  Medications   empagliflozin  (JARDIANCE ) 10 MG TABS tablet    Sig: Take 1 tablet (10 mg total) by mouth daily.    Dispense:  90 tablet    Refill:  2   Patient Instructions  Thank you for coming in today. No change in medications at this time. If there are any concerns  on your bloodwork, I will let you know. Take care!     Signed,   Reyes Pines, MD [facility], [facility] 05/31/24 11:25 AM

## 2024-05-31 NOTE — Patient Instructions (Signed)
 Thank you for coming in today. No change in medications at this time. If there are any concerns on your bloodwork, I will let you know. Take care!

## 2024-06-03 ENCOUNTER — Ambulatory Visit: Payer: Self-pay | Admitting: Family Medicine

## 2024-08-31 ENCOUNTER — Encounter: Admitting: Family Medicine

## 2024-10-22 ENCOUNTER — Encounter: Admitting: Family Medicine
